# Patient Record
Sex: Male | Born: 1955
Health system: Southern US, Community
[De-identification: ages and names within clinical notes are randomized; demographics above are authoritative.]

## PROBLEM LIST (undated history)

## (undated) DIAGNOSIS — M549 Dorsalgia, unspecified: Secondary | ICD-10-CM

## (undated) HISTORY — PX: MULTIPLE TOOTH EXTRACTIONS: SHX2053

---

## 1998-08-13 ENCOUNTER — Encounter: Payer: Self-pay | Admitting: Emergency Medicine

## 1998-08-13 ENCOUNTER — Emergency Department (HOSPITAL_COMMUNITY): Admission: EM | Admit: 1998-08-13 | Discharge: 1998-08-13 | Payer: Self-pay | Admitting: Emergency Medicine

## 1999-12-26 ENCOUNTER — Emergency Department (HOSPITAL_COMMUNITY): Admission: EM | Admit: 1999-12-26 | Discharge: 1999-12-27 | Payer: Self-pay | Admitting: Emergency Medicine

## 2003-05-22 ENCOUNTER — Emergency Department (HOSPITAL_COMMUNITY): Admission: EM | Admit: 2003-05-22 | Discharge: 2003-05-22 | Payer: Self-pay | Admitting: Emergency Medicine

## 2003-06-03 ENCOUNTER — Ambulatory Visit (HOSPITAL_COMMUNITY): Admission: RE | Admit: 2003-06-03 | Discharge: 2003-06-03 | Payer: Self-pay | Admitting: Family Medicine

## 2006-05-07 ENCOUNTER — Emergency Department (HOSPITAL_COMMUNITY): Admission: EM | Admit: 2006-05-07 | Discharge: 2006-05-08 | Payer: Self-pay | Admitting: Emergency Medicine

## 2007-08-09 ENCOUNTER — Emergency Department (HOSPITAL_COMMUNITY): Admission: EM | Admit: 2007-08-09 | Discharge: 2007-08-10 | Payer: Self-pay | Admitting: Emergency Medicine

## 2007-08-14 ENCOUNTER — Inpatient Hospital Stay (HOSPITAL_COMMUNITY): Admission: EM | Admit: 2007-08-14 | Discharge: 2007-08-17 | Payer: Self-pay | Admitting: Emergency Medicine

## 2009-02-10 ENCOUNTER — Encounter: Admission: RE | Admit: 2009-02-10 | Discharge: 2009-02-10 | Payer: Self-pay | Admitting: Family Medicine

## 2010-01-27 ENCOUNTER — Encounter
Admission: RE | Admit: 2010-01-27 | Discharge: 2010-01-27 | Payer: Self-pay | Source: Home / Self Care | Attending: Family Medicine | Admitting: Family Medicine

## 2010-02-15 ENCOUNTER — Other Ambulatory Visit: Payer: Self-pay | Admitting: Family Medicine

## 2010-02-15 DIAGNOSIS — M545 Low back pain, unspecified: Secondary | ICD-10-CM

## 2010-02-15 DIAGNOSIS — R200 Anesthesia of skin: Secondary | ICD-10-CM

## 2010-02-20 ENCOUNTER — Ambulatory Visit
Admission: RE | Admit: 2010-02-20 | Discharge: 2010-02-20 | Disposition: A | Discharge status: Home / Self Care | Payer: BC Managed Care – PPO | Source: Ambulatory Visit | Attending: Family Medicine | Admitting: Family Medicine

## 2010-02-20 DIAGNOSIS — M545 Low back pain, unspecified: Secondary | ICD-10-CM

## 2010-02-20 DIAGNOSIS — R200 Anesthesia of skin: Secondary | ICD-10-CM

## 2010-05-23 NOTE — Discharge Summary (Signed)
NAMECRISTAL, HOWATT                 ACCOUNT NO.:  [id number]   MEDICAL RECORD NO.:  [id number]          PATIENT TYPE:  INP   LOCATION:  1432                         FACILITY:  [facility]   PHYSICIAN:  Beckey Rutter, MD  DATE OF BIRTH:  04-14-1955   DATE OF ADMISSION:  08/14/2007  DATE OF DISCHARGE:  08/17/2007                               DISCHARGE SUMMARY   PRIMARY CARE PHYSICIAN:  Unassigned.   CHIEF COMPLAINT:  Altered mental status.   HOSPITAL COURSE:  58. A 55 year old pleasant African American male admitted with altered      mental status felt secondary to alcohol withdrawal.  The patient      now is back to his baseline in terms of his mentation.  There is no      autonomic activity currently and the patient remained stable for      the last 2 days of admission.  2. The patient was n.p.o. on admission and during that time the      fingerstick was showing hypoglycemia.  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   Mr. Creary is very stable for discharge today.   DISCHARGE MEDICATIONS:  1. Folic acid.  2. Thiamine.  3. Ativan prescription given.   DISCHARGE DIAGNOSIS:  1. Altered mental status secondary to ethanol withdrawal.  2. Dehydration.   DISCHARGE/PLAN:  The patient is discharged today to follow up with his  primary medical doctor within 1 week.  The importance of followup is  stressed for him for further management of his chronic ethanol problems.  The patient has been seen by the social worker and he was given the  information to help him continue on his sobriety.      Beckey Rutter, MD  Electronically Signed     EME/MEDQ  D:  08/17/2007  T:  08/17/2007  Job:  [id number]

## 2010-05-23 NOTE — H&P (Signed)
Scott Lindsey, APFEL                 ACCOUNT NO.:  [id number]   MEDICAL RECORD NO.:  [id number]          PATIENT TYPE:  INP   LOCATION:  0101                         FACILITY:  [facility]   PHYSICIAN:  Eduard Clos, MDDATE OF BIRTH:  Jul 25, 1955   DATE OF ADMISSION:  08/14/2007  DATE OF DISCHARGE:                              HISTORY & PHYSICAL   HISTORY:  Obtained from ER notes and the patient's wife.   CHIEF COMPLAINT:  Altered mental status.   HISTORY OF PRESENT ILLNESS:  A 55 year old male with a history of  chronic alcoholism was brought into the ER after the patient's family  found that he was hallucinating and was getting very restless.  The  patient's family stated that since last Friday the patient has stopped  drinking alcohol suddenly with the intention of stopping it completely.  The patient has been drinking alcohol for the last 20-30 years.  The  patient's sister found him to be getting restless during the evening and  was hallucinating.  The patient did go to his job last night.  The  patient is admitted for further workup and management of alcohol  withdrawal.  The patient is presently drowsy from sedation and per  family, did not complain of any chest pain or shortness of breath.  Has  not had any vomiting but did have some nausea and denied any fever or  chills or diarrhea or discharges.   PAST MEDICAL HISTORY:  Chronic alcoholism.   PAST SURGICAL HISTORY:  None.   MEDICATIONS PRIOR TO ADMISSION:  None.   ALLERGIES:  No known drug allergies.   FAMILY HISTORY:  Noncontributory.   REVIEW OF SYSTEMS:  As per the history of present illness.  Nothing  possible as the patient is drowsy.   PHYSICAL EXAMINATION:  The patient was examined at the bedside.  The  patient is drowsy, arousable.  VITAL SIGNS:  Blood pressure 127/78, pulse 84 per minute, temperature  97, respirations 20 per minute.  HEENT:  Anicteric.  No pallor.  PERRLA.  CHEST:  Bilateral air entry  present.  No rhonchi and no crepitations.  HEART:  S1, S2 heard.  ABDOMEN:  Soft, nontender.  Bowel sounds heard.  CNS:  The patient is drowsy, arousable, moves all limbs.  EXTREMITIES:  Good pulses felt.   LABS:  CBC:  WBC IS 5.3, hemoglobin 12.5, hematocrit 35.8, MCV 101.8,  platelets 104.  Complete metabolic panel:  Sodium 132, potassium 3.9,  chloride 98, carbon dioxide 25, glucose 94, BUN 7, creatinine 0.86.  Alkaline phosphatase 78, AST 38, ALT 18.  Total protein 8.1.  Albumin  4.1.  Calcium 10.1.  Drug screen negative.  Alcohol level less than 5.   ASSESSMENT:  1. Altered mental status probably from alcohol withdrawal.  2. Hyponatremia from dehydration.  3. Chronic alcoholism.   PLAN:  Admit the patient to telemetry.  Place the patient on seizure  precautions and alcohol withdrawal protocol.  Ativan as necessary hourly  for any signs of withdrawal.  Place the patient on n.p.o. until the  patient is more alert and  awake.  Thiamine and folic acid.  Will get a  CT of the head and further recommendations as the patient's condition  evolves.      Eduard Clos, MD  Electronically Signed     ANK/MEDQ  D:  08/14/2007  T:  08/14/2007  Job:  [id number]

## 2010-10-06 LAB — COMPREHENSIVE METABOLIC PANEL
ALT: 19
ALT: 19
AST: 37
AST: 57 — ABNORMAL HIGH
Albumin: 4.1
Albumin: 4.1
Alkaline Phosphatase: 69
Alkaline Phosphatase: 78
Alkaline Phosphatase: 85
BUN: 6
CO2: 24
CO2: 25
CO2: 27
Calcium: 10.1
Calcium: 8.9
Chloride: 100
Chloride: 105
Chloride: 98
Creatinine, Ser: 0.85
Creatinine, Ser: 0.86
GFR calc Af Amer: >60
GFR calc Af Amer: >60
GFR calc non Af Amer: >60
GFR calc non Af Amer: >60
GFR calc non Af Amer: >60
Glucose, Bld: 75
Glucose, Bld: 93
Potassium: 3.8
Potassium: 4.7
Sodium: 132 — ABNORMAL LOW
Sodium: 137
Sodium: 139
Total Bilirubin: 0.6
Total Bilirubin: 1
Total Protein: 7.8

## 2010-10-06 LAB — GLUCOSE, CAPILLARY
Glucose-Capillary: 101 — ABNORMAL HIGH
Glucose-Capillary: 109 — ABNORMAL HIGH
Glucose-Capillary: 44 — ABNORMAL LOW
Glucose-Capillary: 46 — ABNORMAL LOW
Glucose-Capillary: 49 — ABNORMAL LOW
Glucose-Capillary: 55 — ABNORMAL LOW
Glucose-Capillary: 57 — ABNORMAL LOW
Glucose-Capillary: 63 — ABNORMAL LOW
Glucose-Capillary: 82
Glucose-Capillary: 84
Glucose-Capillary: 88
Glucose-Capillary: 90

## 2010-10-06 LAB — LIPID PANEL
Total CHOL/HDL Ratio: 2.1
VLDL: 9

## 2010-10-06 LAB — CBC
HCT: 37.2 — ABNORMAL LOW
Hemoglobin: 11.5 — ABNORMAL LOW
Hemoglobin: 12.8 — ABNORMAL LOW
Hemoglobin: 13.4
MCHC: 34.1
MCHC: 34.4
MCHC: 34.5
MCV: 101.8 — ABNORMAL HIGH
MCV: 102.1 — ABNORMAL HIGH
Platelets: 104 — ABNORMAL LOW
Platelets: 109 — ABNORMAL LOW
Platelets: 115 — ABNORMAL LOW
RBC: 3.52 — ABNORMAL LOW
RBC: 3.64 — ABNORMAL LOW
RBC: 3.8 — ABNORMAL LOW
RDW: 14.9
RDW: 14.9
WBC: 3.6 — ABNORMAL LOW
WBC: 4.3
WBC: 5.3

## 2010-10-06 LAB — DIFFERENTIAL
Basophils Absolute: 0
Basophils Absolute: 0
Basophils Relative: 1
Basophils Relative: 1
Eosinophils Absolute: 0.1
Eosinophils Relative: 2
Lymphocytes Relative: 38
Lymphs Abs: 1.6
Monocytes Absolute: 0.7
Monocytes Relative: 17 — ABNORMAL HIGH
Monocytes Relative: 21 — ABNORMAL HIGH
Neutro Abs: 1.8
Neutro Abs: 3.4
Neutrophils Relative %: 43
Neutrophils Relative %: 65

## 2010-10-06 LAB — URINALYSIS, ROUTINE W REFLEX MICROSCOPIC
Bilirubin Urine: NEGATIVE
Glucose, UA: NEGATIVE
Hgb urine dipstick: NEGATIVE
Ketones, ur: NEGATIVE
Nitrite: NEGATIVE
Protein, ur: NEGATIVE
Specific Gravity, Urine: 1.009
Urobilinogen, UA: 1
pH: 5.5

## 2010-10-06 LAB — RAPID URINE DRUG SCREEN, HOSP PERFORMED
Amphetamines: NOT DETECTED
Amphetamines: NOT DETECTED
Barbiturates: NOT DETECTED
Barbiturates: NOT DETECTED
Benzodiazepines: NOT DETECTED
Benzodiazepines: NOT DETECTED
Cocaine: NOT DETECTED
Opiates: NOT DETECTED
Tetrahydrocannabinol: NOT DETECTED
Tetrahydrocannabinol: NOT DETECTED

## 2010-10-06 LAB — BASIC METABOLIC PANEL
CO2: 26
Calcium: 8.9
Creatinine, Ser: 0.82
GFR calc non Af Amer: >60
Glucose, Bld: 101 — ABNORMAL HIGH
Sodium: 138

## 2010-10-06 LAB — ETHANOL: Alcohol, Ethyl (B): 432

## 2010-10-06 LAB — MAGNESIUM: Magnesium: 1.8

## 2010-11-07 ENCOUNTER — Ambulatory Visit
Admission: RE | Admit: 2010-11-07 | Discharge: 2010-11-07 | Disposition: A | Discharge status: Home / Self Care | Payer: BC Managed Care – PPO | Source: Ambulatory Visit | Attending: Radiation Oncology | Admitting: Radiation Oncology

## 2010-11-07 DIAGNOSIS — F17200 Nicotine dependence, unspecified, uncomplicated: Secondary | ICD-10-CM | POA: Insufficient documentation

## 2010-11-07 DIAGNOSIS — M5126 Other intervertebral disc displacement, lumbar region: Secondary | ICD-10-CM | POA: Insufficient documentation

## 2010-11-07 DIAGNOSIS — C61 Malignant neoplasm of prostate: Secondary | ICD-10-CM | POA: Insufficient documentation

## 2010-11-14 ENCOUNTER — Ambulatory Visit: Payer: BC Managed Care – PPO

## 2010-11-14 ENCOUNTER — Ambulatory Visit: Payer: BC Managed Care – PPO | Admitting: Radiation Oncology

## 2010-11-16 ENCOUNTER — Other Ambulatory Visit: Payer: Self-pay | Admitting: Urology

## 2010-12-01 ENCOUNTER — Encounter (HOSPITAL_COMMUNITY): Payer: Self-pay

## 2010-12-11 ENCOUNTER — Encounter (HOSPITAL_COMMUNITY): Payer: Self-pay

## 2010-12-11 ENCOUNTER — Encounter (HOSPITAL_COMMUNITY)
Admission: RE | Admit: 2010-12-11 | Discharge: 2010-12-11 | Disposition: A | Discharge status: Home / Self Care | Payer: BC Managed Care – PPO | Source: Ambulatory Visit | Attending: Urology | Admitting: Urology

## 2010-12-11 LAB — BASIC METABOLIC PANEL
BUN: 5 mg/dL — ABNORMAL LOW (ref 6–23)
Calcium: 9.1 mg/dL (ref 8.4–10.5)
Creatinine, Ser: 0.72 mg/dL (ref 0.50–1.35)
GFR calc Af Amer: >90 mL/min (ref >90)
GFR calc non Af Amer: >90 mL/min (ref >90)
Glucose, Bld: 90 mg/dL (ref 70–99)
Potassium: 4.1 mEq/L (ref 3.5–5.1)

## 2010-12-11 LAB — CBC
HCT: 30.4 % — ABNORMAL LOW (ref 39.0–52.0)
Hemoglobin: 10.5 g/dL — ABNORMAL LOW (ref 13.0–17.0)
MCH: 31.6 pg (ref 26.0–34.0)
MCHC: 34.5 g/dL (ref 30.0–36.0)
MCV: 91.6 fL (ref 78.0–100.0)
RDW: 17.3 % — ABNORMAL HIGH (ref 11.5–15.5)

## 2010-12-11 LAB — TYPE AND SCREEN

## 2010-12-11 NOTE — Patient Instructions (Signed)
18 Scott Lindsey  12/11/2010   Your procedure is scheduled on:  WED  12/5  AT 8:30 AM  Report to [facility] at  6:30 AM.  Call this number if you have problems the morning of surgery: [phone]   Remember: FOLLOW BOWEL PREP INSTRUCTIONS AND CLEAR LIQUID DIET INSTRUCTIONS -THE DAY BEFORE YOUR SURGERY.   Do not eat food OR DRINK ANYTHING :After Midnight THE NIGHT BEFORE YOUR SURGERY.       Do not wear jewelry, make-up or nail polish.  Do not wear lotions, powders, or perfumes. You may wear deodorant.  Do not shave 48 hours prior to surgery.  Do not bring valuables to the hospital.  Contacts, dentures or bridgework may not be worn into surgery.  Leave suitcase in the car. After surgery it may be brought to your room.  For patients admitted to the hospital, checkout time is 11:00 AM the day of discharge.   Patients discharged the day of surgery will not be allowed to drive home.    Special Instructions: CHG Shower Use Special Wash: 1/2 bottle night before surgery and 1/2 bottle morning of surgery.   Please read over the following fact sheets that you were given: Blood Transfusion Information and MRSA Information

## 2010-12-12 DIAGNOSIS — C61 Malignant neoplasm of prostate: Secondary | ICD-10-CM | POA: Diagnosis present

## 2010-12-12 NOTE — H&P (Signed)
History of Present Illness     Scott Lindsey  was sent to me with an elevated PSA of approximately 7.1.  Digital rectal exam revealed a small prostate without any nodules or induration.  Ultrasound revealed a 20 gram prostate without any other worrisome features.  Unfortunately, the patient's biopsies were positive.  On the right side 5 out of 6 cores were positive primarily for Gleason 3+3=6 cancer, although one core did show Gleason 3+4=7 cancer.  Core involvement was anywhere from 10-60% with more of the tumor concentrated at the mid and base of the prostate.  On the left side 4 out of 6 biopsies were also positive.  This was a mixture of Gleason 3+3=6 and Gleason 3+4=7 cancer.  Core involvement in the 5-20% range.  The patient appears to have intermediate risk clinical stage T1c disease. He has had no obvious complications or problems from the biopsy.  The patient denied any significant voiding complaints prior to his biopsy.  Bone scan was negative.    Past Medical History Problems  1. History of  Bulging Lumbar Disc 722.10  Current Meds 1. Advil 200 MG Oral Capsule; 1 capsule as needed; Therapy: (Recorded:19Jul2012) to  Allergies Medication  1. No Known Drug Allergies  Family History Problems  1. Maternal history of  Acute Myocardial Infarction V17.3 2. Paternal history of  Acute Myocardial Infarction V17.3 3. Maternal history of  Diabetes Mellitus V18.0 4. Family history of  Family Health Status Number Of Children 2; 1 son / 1 daughter 5. Family history of  Father Deceased At Age ____ 69 / Heart Attack 6. Maternal history of  Hypertension V17.49 7. Family history of  Mother Deceased At Age ____ 34 / Heart Attack  Social History Problems    Alcohol Use 2 per day   Marital History - Currently Married   Occupation: Health and safety inspector   Tobacco Use 305.1 Smokes 1/2 pack or less daily; Smoked since the age of 8, approx. 38 years; Denies any other forms of tobacco use. Denied     History of  Caffeine Use  Review of Systems Genitourinary, constitutional, skin, eye, otolaryngeal, hematologic/lymphatic, cardiovascular, pulmonary, endocrine, musculoskeletal, gastrointestinal, neurological and psychiatric system(s) were reviewed and pertinent findings if present are noted.  Musculoskeletal: back pain and joint pain.    Vitals Vital Signs [Data Includes: Last 1 Day]  19Jul2012 02:00PM  BMI Calculated: 21.98 BSA Calculated: 1.78 Height: 5 ft 8 in Weight: 145 lb  Blood Pressure: 111 / 75, RUE, Sitting Temperature: 98.6 F, Oral Heart Rate: 98  Physical Exam Constitutional: Well nourished and well developed . No acute distress.  ENT:. The ears and nose are normal in appearance.  Neck: The appearance of the neck is normal and no neck mass is present.  Pulmonary: No respiratory distress and normal respiratory rhythm and effort.  Cardiovascular: Heart rate and rhythm are normal . No peripheral edema.  Abdomen: The abdomen is soft and nontender. No masses are palpated. No CVA tenderness. No hernias are palpable. No hepatosplenomegaly noted.  Rectal: Rectal exam demonstrates decreased sphincter tone, no tenderness and no masses. Estimated prostate size is 1+. The prostate has no nodularity and is not tender. The left seminal vesicle is nonpalpable. The right seminal vesicle is nonpalpable. The perineum is normal on inspection.  Genitourinary: Examination of the penis demonstrates no discharge, no masses, no lesions and a normal meatus. The penis is circumcised. The scrotum is without lesions. The right epididymis is palpably normal and non-tender. The left epididymis  is palpably normal and non-tender. The right testis is non-tender and without masses. The left testis is non-tender and without masses.  Lymphatics: The femoral and inguinal nodes are not enlarged or tender.  Skin: Normal skin turgor, no visible rash and no visible skin lesions.  Neuro/Psych:. Mood and affect are  appropriate.    Results/Data Urine [Data Includes: Last 1 Day]  19Jul2012  COLOR: YELLOW  Reference Range YELLOW APPEARANCE: CLEAR  Reference Range CLEAR SPECIFIC GRAVITY: 1.025  Reference Range 1.005-1.030 pH: 5.5  Reference Range 5.0-8.0 GLUCOSE: NEG mg/dL Reference Range NEG BILIRUBIN: SMALL  Abnormal Reference Range NEG KETONE: TRACE mg/dL Abnormal Reference Range NEG BLOOD: NEG  Reference Range NEG PROTEIN: NEG mg/dL Reference Range NEG UROBILINOGEN: 0.2 mg/dL Reference Range 9.8-1.1 NITRITE: NEG  Reference Range NEG LEUKOCYTE ESTERASE: NEG  Reference Range NEG  Assessment Assessed  1. Prostate Cancer 185  Discussion/Summary  The patient was counseled about the natural history of prostate cancer and the standard treatment options that are available for prostate cancer. It was explained to him how his age and life expectancy, clinical stage, Gleason score, and PSA affect his prognosis, the decision to proceed with additional staging studies, as well as how that information influences recommended treatment strategies. We discussed the roles for active surveillance, radiation therapy, surgical therapy, androgen deprivation, as well as ablative therapy options for the treatment of prostate cancer as appropriate to his individual cancer situation. We discussed the risks and benefits of these options with regard to their impact on cancer control and also in terms of potential adverse events, complications, and impact on quiality of life particularly related to urinary, bowel, and sexual function. The patient was encouraged to ask questions throughout the discussion today and all questions were answered to his stated satisfaction. In addition, the patient was provided with and/or directed to appropriate resources and literature for further education about prostate cancer and treatment options.   We discussed surgical therapy for prostate cancer including the different available surgical  approaches. We discussed, in detail, the risks and expectations of surgery with regard to cancer control, urinary control, and erectile function as well as the expected postoperative recovery process. The risks, potential complications/adverse events of radical prostatectomy as well as alternative options were explained to the patient.   We discussed surgical therapy for prostate cancer including the different available surgical approaches. We discussed, in detail, the risks and expectations of surgery with regard to cancer control, urinary control, and erectile function as well as the expected postoperative recovery process. Additional risks of surgery including but not limited to bleeding, infection, hernia formation, nerve damage, lymphocele formation, bowel/rectal injury potentially necessitating colostomy, damage to the urinary tract resulting in urine leakage, urethral stricture, and the cardiopulmonary risks such as myocardial infarction, stroke, death, venothromboembolism, etc. were explained. The risk of open surgical conversion for robotic/laparoscopic prostatectomy was also discussed.

## 2010-12-13 ENCOUNTER — Encounter (HOSPITAL_COMMUNITY): Payer: Self-pay | Admitting: Certified Registered Nurse Anesthetist

## 2010-12-13 ENCOUNTER — Encounter (HOSPITAL_COMMUNITY): Payer: Self-pay | Admitting: *Deleted

## 2010-12-13 ENCOUNTER — Encounter (HOSPITAL_COMMUNITY)
Admission: RE | Disposition: A | Discharge status: Home / Self Care | Payer: Self-pay | Source: Ambulatory Visit | Attending: Urology

## 2010-12-13 ENCOUNTER — Other Ambulatory Visit: Payer: Self-pay | Admitting: Urology

## 2010-12-13 ENCOUNTER — Inpatient Hospital Stay (HOSPITAL_COMMUNITY)
Admission: RE | Admit: 2010-12-13 | Discharge: 2010-12-14 | DRG: 335 | Disposition: A | Discharge status: Home / Self Care | Payer: BC Managed Care – PPO | Source: Ambulatory Visit | Attending: Urology | Admitting: Urology

## 2010-12-13 ENCOUNTER — Inpatient Hospital Stay (HOSPITAL_COMMUNITY): Payer: BC Managed Care – PPO | Admitting: Certified Registered Nurse Anesthetist

## 2010-12-13 DIAGNOSIS — C61 Malignant neoplasm of prostate: Principal | ICD-10-CM | POA: Diagnosis present

## 2010-12-13 DIAGNOSIS — F17200 Nicotine dependence, unspecified, uncomplicated: Secondary | ICD-10-CM | POA: Diagnosis present

## 2010-12-13 DIAGNOSIS — D6489 Other specified anemias: Secondary | ICD-10-CM | POA: Diagnosis present

## 2010-12-13 HISTORY — PX: ROBOT ASSISTED LAPAROSCOPIC RADICAL PROSTATECTOMY: SHX5141

## 2010-12-13 SURGERY — ROBOTIC ASSISTED LAPAROSCOPIC RADICAL PROSTATECTOMY
Anesthesia: General | Site: Abdomen | Wound class: Clean Contaminated

## 2010-12-13 MED ORDER — KCL IN DEXTROSE-NACL 10-5-0.45 MEQ/L-%-% IV SOLN
INTRAVENOUS | Status: DC
  Administered 2010-12-13: 125 mL/h via INTRAVENOUS
  Administered 2010-12-14: 03:00:00 via INTRAVENOUS
  Filled 2010-12-13 (×6): qty 1000

## 2010-12-13 MED ORDER — BUPIVACAINE-EPINEPHRINE 0.25% -1:200000 IJ SOLN
INTRAMUSCULAR | Status: DC | PRN
  Administered 2010-12-13: 26 mL

## 2010-12-13 MED ORDER — ACETAMINOPHEN 10 MG/ML IV SOLN
INTRAVENOUS | Status: DC | PRN
  Administered 2010-12-13: 1000 mg via INTRAVENOUS

## 2010-12-13 MED ORDER — CISATRACURIUM BESYLATE 2 MG/ML IV SOLN
INTRAVENOUS | Status: DC | PRN
  Administered 2010-12-13: 4 mg via INTRAVENOUS
  Administered 2010-12-13: 2 mg via INTRAVENOUS
  Administered 2010-12-13: 4 mg via INTRAVENOUS

## 2010-12-13 MED ORDER — FENTANYL CITRATE 0.05 MG/ML IJ SOLN
INTRAMUSCULAR | Status: DC | PRN
  Administered 2010-12-13 (×2): 100 ug via INTRAVENOUS
  Administered 2010-12-13: 50 ug via INTRAVENOUS

## 2010-12-13 MED ORDER — PROMETHAZINE HCL 25 MG/ML IJ SOLN: 6.2500 mg | INTRAMUSCULAR | Status: DC | PRN

## 2010-12-13 MED ORDER — KETOROLAC TROMETHAMINE 30 MG/ML IJ SOLN
30.0000 mg | Freq: Four times a day (QID) | INTRAMUSCULAR | Status: DC
  Administered 2010-12-13 – 2010-12-14 (×5): 30 mg via INTRAVENOUS
  Filled 2010-12-13 (×5): qty 1

## 2010-12-13 MED ORDER — MIDAZOLAM HCL 5 MG/5ML IJ SOLN
INTRAMUSCULAR | Status: DC | PRN
  Administered 2010-12-13: 2 mg via INTRAVENOUS

## 2010-12-13 MED ORDER — MEPERIDINE HCL 50 MG/ML IJ SOLN: 6.2500 mg | INTRAMUSCULAR | Status: DC | PRN

## 2010-12-13 MED ORDER — LACTATED RINGERS IV SOLN
INTRAVENOUS | Status: DC
  Administered 2010-12-13: 100 mL/h via INTRAVENOUS

## 2010-12-13 MED ORDER — LACTATED RINGERS IV SOLN
INTRAVENOUS | Status: DC | PRN
  Administered 2010-12-13: 08:00:00 via INTRAVENOUS

## 2010-12-13 MED ORDER — SUCCINYLCHOLINE CHLORIDE 20 MG/ML IJ SOLN
INTRAMUSCULAR | Status: DC | PRN
  Administered 2010-12-13: 100 mg via INTRAVENOUS

## 2010-12-13 MED ORDER — PROPOFOL 10 MG/ML IV BOLUS
INTRAVENOUS | Status: DC | PRN
  Administered 2010-12-13: 50 mg via INTRAVENOUS
  Administered 2010-12-13: 150 mg via INTRAVENOUS

## 2010-12-13 MED ORDER — CEFAZOLIN SODIUM 1-5 GM-% IV SOLN
1.0000 g | Freq: Three times a day (TID) | INTRAVENOUS | Status: AC
  Administered 2010-12-13 (×2): 1 g via INTRAVENOUS
  Filled 2010-12-13 (×2): qty 50

## 2010-12-13 MED ORDER — CIPROFLOXACIN HCL 500 MG PO TABS: 500.0000 mg | ORAL_TABLET | Freq: Two times a day (BID) | ORAL | Status: AC

## 2010-12-13 MED ORDER — SODIUM CHLORIDE 0.9 % IR SOLN
Status: DC | PRN
  Administered 2010-12-13: 1000 mL

## 2010-12-13 MED ORDER — HYDROCODONE-ACETAMINOPHEN 5-325 MG PO TABS
1.0000 | ORAL_TABLET | Freq: Four times a day (QID) | ORAL | Status: AC | PRN
Start: 2010-12-13 — End: 2010-12-23

## 2010-12-13 MED ORDER — INDIGOTINDISULFONATE SODIUM 8 MG/ML IJ SOLN
INTRAMUSCULAR | Status: DC | PRN
  Administered 2010-12-13 (×2): 5 mL via INTRAVENOUS

## 2010-12-13 MED ORDER — HYDROMORPHONE HCL PF 1 MG/ML IJ SOLN: 0.2500 mg | INTRAMUSCULAR | Status: DC | PRN

## 2010-12-13 MED ORDER — SODIUM CHLORIDE 0.9 % IV SOLN
1.5000 g | Freq: Once | INTRAVENOUS | Status: AC
  Administered 2010-12-13: 1.5 g via INTRAVENOUS

## 2010-12-13 MED ORDER — SODIUM CHLORIDE 0.9 % IV BOLUS (SEPSIS)
1000.0000 mL | Freq: Once | INTRAVENOUS | Status: AC
  Administered 2010-12-13: 1000 mL via INTRAVENOUS

## 2010-12-13 MED ORDER — HYDROCODONE-ACETAMINOPHEN 5-325 MG PO TABS: 1.0000 | ORAL_TABLET | ORAL | Status: DC | PRN

## 2010-12-13 MED ORDER — HYDROMORPHONE HCL PF 1 MG/ML IJ SOLN
INTRAMUSCULAR | Status: DC | PRN
  Administered 2010-12-13 (×2): 1 mg via INTRAVENOUS

## 2010-12-13 MED ORDER — LACTATED RINGERS IV SOLN
INTRAVENOUS | Status: DC | PRN
  Administered 2010-12-13: 10:00:00

## 2010-12-13 MED ORDER — LIDOCAINE HCL (CARDIAC) 20 MG/ML IV SOLN
INTRAVENOUS | Status: DC | PRN
  Administered 2010-12-13: 80 mg via INTRAVENOUS

## 2010-12-13 MED ORDER — MORPHINE SULFATE 2 MG/ML IJ SOLN: 2.0000 mg | INTRAMUSCULAR | Status: DC | PRN

## 2010-12-13 MED ORDER — ONDANSETRON HCL 4 MG/2ML IJ SOLN
INTRAMUSCULAR | Status: DC | PRN
  Administered 2010-12-13: 4 mg via INTRAVENOUS

## 2010-12-13 SURGICAL SUPPLY — 42 items
CANISTER SUCTION 2500CC (MISCELLANEOUS) ×2 IMPLANT
CATH FOLEY 2WAY SLVR  5CC 20FR (CATHETERS) ×1
CATH FOLEY 2WAY SLVR 5CC 20FR (CATHETERS) ×1 IMPLANT
CATH ROBINSON RED A/P 8FR (CATHETERS) ×2 IMPLANT
CATH TIEMANN FOLEY 18FR 5CC (CATHETERS) ×2 IMPLANT
CHLORAPREP W/TINT 26ML (MISCELLANEOUS) ×2 IMPLANT
CLIP LIGATING HEM O LOK PURPLE (MISCELLANEOUS) ×8 IMPLANT
CLOTH BEACON ORANGE TIMEOUT ST (SAFETY) ×2 IMPLANT
CORD HIGH FREQUENCY UNIPOLAR (ELECTROSURGICAL) ×2 IMPLANT
COVER SURGICAL LIGHT HANDLE (MISCELLANEOUS) ×2 IMPLANT
COVER TIP SHEARS 8 DVNC (MISCELLANEOUS) ×1 IMPLANT
COVER TIP SHEARS 8MM DA VINCI (MISCELLANEOUS) ×1
DECANTER SPIKE VIAL GLASS SM (MISCELLANEOUS) ×2 IMPLANT
DRAPE SURG IRRIG POUCH 19X23 (DRAPES) ×2 IMPLANT
DRAPE UTILITY 15X26 (DRAPE) ×2 IMPLANT
DRSG TEGADERM 6X8 (GAUZE/BANDAGES/DRESSINGS) ×6 IMPLANT
ELECT REM PT RETURN 9FT ADLT (ELECTROSURGICAL) ×2
ELECTRODE REM PT RTRN 9FT ADLT (ELECTROSURGICAL) ×1 IMPLANT
GLOVE BIO SURGEON STRL SZ 6.5 (GLOVE) ×4 IMPLANT
GLOVE BIOGEL M STRL SZ7.5 (GLOVE) ×2 IMPLANT
GOWN PREVENTION PLUS XLARGE (GOWN DISPOSABLE) ×2 IMPLANT
GOWN STRL NON-REIN LRG LVL3 (GOWN DISPOSABLE) ×2 IMPLANT
GOWN STRL REIN XL XLG (GOWN DISPOSABLE) ×2 IMPLANT
HOLDER FOLEY CATH W/STRAP (MISCELLANEOUS) ×2 IMPLANT
IV LACTATED RINGERS 1000ML (IV SOLUTION) ×2 IMPLANT
KIT ACCESSORY DA VINCI DISP (KITS) ×1
KIT ACCESSORY DVNC DISP (KITS) ×1 IMPLANT
NDL SAFETY ECLIPSE 18X1.5 (NEEDLE) ×1 IMPLANT
NEEDLE HYPO 18GX1.5 SHARP (NEEDLE) ×1
PACK ROBOT UROLOGY CUSTOM (CUSTOM PROCEDURE TRAY) ×2 IMPLANT
POSITIONER SURGICAL ARM (MISCELLANEOUS) ×4 IMPLANT
RELOAD GREEN ECHELON 45 (STAPLE) ×2 IMPLANT
SEALER TISSUE G2 CVD JAW 45CM (ENDOMECHANICALS) IMPLANT
SET TUBE IRRIG SUCTION NO TIP (IRRIGATION / IRRIGATOR) ×2 IMPLANT
SOLUTION ELECTROLUBE (MISCELLANEOUS) ×2 IMPLANT
SPONGE GAUZE 4X4 12PLY (GAUZE/BANDAGES/DRESSINGS) ×2 IMPLANT
SUT VIC AB 2-0 SH 27 (SUTURE) ×1
SUT VIC AB 2-0 SH 27X BRD (SUTURE) ×1 IMPLANT
SUT VICRYL 0 UR6 27IN ABS (SUTURE) ×2 IMPLANT
SYR 27GX1/2 1ML LL SAFETY (SYRINGE) ×2 IMPLANT
TOWEL OR NON WOVEN STRL DISP B (DISPOSABLE) ×2 IMPLANT
WATER STERILE IRR 1500ML POUR (IV SOLUTION) ×4 IMPLANT

## 2010-12-13 NOTE — Anesthesia Postprocedure Evaluation (Signed)
  Anesthesia Post-op Note  Patient: Scott Lindsey  Procedure(s) Performed:  ROBOTIC ASSISTED LAPAROSCOPIC RADICAL PROSTATECTOMY - with Bilateral Pelivic Lymph Node Dissection  Patient Location: PACU  Anesthesia Type: General  Level of Consciousness: awake and alert   Airway and Oxygen Therapy: Patient Spontanous Breathing  Post-op Pain: mild  Post-op Assessment: Post-op Vital signs reviewed, Patient's Cardiovascular Status Stable, Respiratory Function Stable, Patent Airway and No signs of Nausea or vomiting  Post-op Vital Signs: stable  Complications: No apparent anesthesia complications

## 2010-12-13 NOTE — Anesthesia Preprocedure Evaluation (Addendum)
Anesthesia Evaluation  Patient identified by MRN, date of birth, ID band Patient awake    Reviewed: Allergy & Precautions, H&P , NPO status , Patient's Chart, lab work & pertinent test results  Airway Mallampati: II TM Distance: >3 FB Neck ROM: Full    Dental No notable dental hx.    Pulmonary neg pulmonary ROS,  clear to auscultation  Pulmonary exam normal       Cardiovascular neg cardio ROS Regular Normal thrombocytopenia   Neuro/Psych Negative Neurological ROS  Negative Psych ROS   GI/Hepatic negative GI ROS, Neg liver ROS,   Endo/Other  Negative Endocrine ROS  Renal/GU negative Renal ROS  Genitourinary negative   Musculoskeletal negative musculoskeletal ROS (+)   Abdominal   Peds negative pediatric ROS (+)  Hematology negative hematology ROS (+)   Anesthesia Other Findings   Reproductive/Obstetrics negative OB ROS                          Anesthesia Physical Anesthesia Plan  ASA: II  Anesthesia Plan: General   Post-op Pain Management:    Induction: Intravenous  Airway Management Planned: Oral ETT  Additional Equipment:   Intra-op Plan:   Post-operative Plan: Extubation in OR  Informed Consent: I have reviewed the patients History and Physical, chart, labs and discussed the procedure including the risks, benefits and alternatives for the proposed anesthesia with the patient or authorized representative who has indicated his/her understanding and acceptance.   Dental advisory given  Plan Discussed with: CRNA  Anesthesia Plan Comments:         Anesthesia Quick Evaluation

## 2010-12-13 NOTE — Interval H&P Note (Signed)
History and Physical Interval Note:  12/13/2010 8:25 AM  Scott Lindsey  has presented today for surgery, with the diagnosis of prostate cancer  The various methods of treatment have been discussed with the patient and family. After consideration of risks, benefits and other options for treatment, the patient has consented to  Procedure(s): ROBOTIC ASSISTED LAPAROSCOPIC RADICAL PROSTATECTOMY as a surgical intervention .  The patients' history has been reviewed, patient examined, no change in status, stable for surgery.  I have reviewed the patients' chart and labs.  Questions were answered to the patient's satisfaction.     Scott Lindsey S

## 2010-12-13 NOTE — Transfer of Care (Signed)
Immediate Anesthesia Transfer of Care Note  Patient: Scott Lindsey  Procedure(s) Performed:  ROBOTIC ASSISTED LAPAROSCOPIC RADICAL PROSTATECTOMY - with Bilateral Pelivic Lymph Node Dissection  Patient Location: PACU  Anesthesia Type: General  Level of Consciousness: awake, alert  and oriented  Airway & Oxygen Therapy: Patient Spontanous Breathing and Patient connected to face mask oxygen  Post-op Assessment: Report given to PACU RN  Post vital signs: Reviewed and stable  Complications: No apparent anesthesia complications

## 2010-12-13 NOTE — Progress Notes (Signed)
Pt did mechanical bowel prep 12/12/10 with good results

## 2010-12-13 NOTE — Op Note (Signed)
Preoperative diagnosis: Clinical stage T1c Adenocarcinoma prostate  Postoperative diagnosis: Same  Procedure: Robotic-assisted laparoscopic radical retropubic prostatectomy with bilateral pelvic lymph node dissection  Surgeon: Valetta Fuller, MD  Asst.: Pecola Leisure, PA  Anesthesia: Gen. Endotracheal  Indications: Patient was diagnosed with clinical stage TIc Adenocarcinoma the prostate. He underwent extensive consultation with regard to treatment options. The patient decided on a surgical approach. He appeared to understand the distinct advantages as well as the disadvantages of this procedure. The patient has performed a mechanical bowel prep. He has had placement of PAS compression boots and received perioperative antibiotics. The patient's preoperative PSA was 7.1 Ultrasound revealed a 20 g prostate. 9/12 cores were positive for adenocarcinoma with a mixture of Gleason 3+3+6 and Gleason 3+4=7. Technique and findings:The patient was brought to the operating room and had successful induction of general endotracheal anesthesia.the patient was placed in a low lithotomy position with careful padding of all extremities. He was secured to the operative table and placed in the steep Trendelenburg position. He was prepped and draped in usual manner. A Foley catheter was placed sterilely on the field. Camera port site was chosen 18 cm above the pubic symphysis just to the left of the umbilicus. A standard open Hassan technique was utilized. A 12 mm trocar was placed without difficulty. The camera was then inserted and no abnormalities were noted within the pelvis. The trochars were placed with direct visual guidance. This included 3 8mm robotic trochars and a 12 mm and 5 mm assist ports. Once all the ports were placed the robot was docked. The bladder was filled and the space of Retzius was developed with electrocautery dissection as well as blunt dissection. Superficial fat off the endopelvic fascia and  bladder neck was removed with electrocautery scissors. The endopelvic fascia was then incised bilaterally from base to apex. Levator musculature was swept off the apex of the prostate isolating the dorsal venous complex which was then stapled with the ETS stapling device. The anterior bladder neck was identified with the aid of the Foley balloon. This was then transected down to the Foley catheter with electrocautery scissors. The Foley catheter was then retracted anteriorly. Indigo carmine was given and we appeared to be well away from the ureteral orifices. The posterior bladder neck was then transected and the dissection carried down to the adnexal structures. The seminal vesicles and vas deferens on both sides were then individually dissected free and retracted anteriorly. The posterior plane between the rectum and prostate was then established primarily with blunt dissection.  Attention was then turned towards nerve sparing. The patient was felt to be a candidate for limited bilateral nerve sparing. Superficial fascia along the anterior lateral aspect of the prostate was incised bilaterally. This tissue was then swept laterally until we were able to establish a groove between the neurovascular tissue and the posterior lateral aspect on the prostate bilaterally. This groove was then extended from the apex back to the base of the prostate. With the prostate retracted anteriorly the vascular pedicles of the prostate were taken with the Enseal device. The Foley catheter was then reinserted and the anterior urethra was transected. The posterior urethra was then transected as were some rectourethralis fibers. The prostate was then removed from the pelvis. The pelvis was then copiously irrigated. Rectal insufflation was performed and there was no evidence of rectal injury.  Attention was then turned towards bilateral pelvic lymph node dissection. The obturator node packets were removed I laterally and the dissection  extended towards the bifurcation of the iliac artery. The obturator nerve was identified on both sides and preserved. Hemalock clips were used for small veins and lymphatic channels. The node packets were sent for permanent analysis.  Attention was then turned towards reconstruction. The bladder neck did not require any reconstruction. The bladder neck and posterior urethra were reapproximated at the 6:00 position utilizing a 2-0 Vicryl suture. The rest of the anastomosis was done with a double-armed 3-0 Monocryl suture in a 360 degree manner. Additional indigo carmine was given. A new catheter was placed and bladder irrigation revealed no evidence of leakage. A Blake drain was placed through one of the robotic trochars and positioned in the retropubic space above the anastomosis. This was then secured to the skin with a nylon suture. The prostate was placed in the Endopouch retrieval bag. The 12 mm trocar site was closed with a Vicryl suture with the aid of a suture passer. Our other trochars were taken out with direct visual guidance without evidence of any bleeding. The camera port incision was extended slightly to allow for removal of the specimen and then closed with a running Vicryl suture. All port sites were infiltrated with Marcaine and then closed with surgical clips. The patient was then taken to recovery room having had no obvious complications or problems. Sponge and needle counts were correct.

## 2010-12-13 NOTE — Anesthesia Procedure Notes (Signed)
Procedure Name: Intubation Date/Time: 12/13/2010 8:32 AM Performed by: Hulan Fess Pre-anesthesia Checklist: Patient identified, Emergency Drugs available, Suction available, Patient being monitored and Timeout performed Patient Re-evaluated:Patient Re-evaluated prior to inductionOxygen Delivery Method: Circle System Utilized Preoxygenation: Pre-oxygenation with 100% oxygen Intubation Type: IV induction Ventilation: Mask ventilation without difficulty Laryngoscope Size: Mac and 3 Grade View: Grade I Tube type: Oral Tube size: 8.0 mm Number of attempts: 1 Placement Confirmation: ETT inserted through vocal cords under direct vision,  positive ETCO2 and breath sounds checked- equal and bilateral

## 2010-12-14 LAB — MRSA CULTURE

## 2010-12-14 LAB — BASIC METABOLIC PANEL
BUN: 3 mg/dL — ABNORMAL LOW (ref 6–23)
CO2: 25 mEq/L (ref 19–32)
Chloride: 99 mEq/L (ref 96–112)
Glucose, Bld: 102 mg/dL — ABNORMAL HIGH (ref 70–99)
Potassium: 4.1 mEq/L (ref 3.5–5.1)
Sodium: 133 mEq/L — ABNORMAL LOW (ref 135–145)

## 2010-12-14 LAB — HEMOGLOBIN AND HEMATOCRIT, BLOOD: HCT: 27.2 % — ABNORMAL LOW (ref 39.0–52.0)

## 2010-12-14 MED ORDER — BISACODYL 10 MG RE SUPP
10.0000 mg | Freq: Once | RECTAL | Status: AC
  Administered 2010-12-14: 10 mg via RECTAL
  Filled 2010-12-14: qty 1

## 2010-12-14 NOTE — Progress Notes (Signed)
1 Day Post-Op Subjective: Patient reports tolerating PO and pain control good.  He initially had mild nausea after being moved to floor last night but this has resolved.  He amb last night without difficulty.    Objective: Vital signs in last 24 hours: Temp:  [97.2 F (36.2 C)-99.4 F (37.4 C)] 99.4 F (37.4 C) (12/06 0647) Pulse Rate:  [34-125] 93  (12/06 0647) Resp:  [8-18] 18  (12/06 0647) BP: (120-162)/(57-94) 120/60 mmHg (12/06 0647) SpO2:  [83 %-100 %] 97 % (12/06 0647) Weight:  [62.143 kg (137 lb)] 137 lb (62.143 kg) (12/05 1536)  Intake/Output from previous day: 12/05 0701 - 12/06 0700 In: 2934.2 [P.O.:120; I.V.:2729.2; IV Piggyback:50] Out: 2620 [Urine:2400; Drains:120; Blood:100]  Physical Exam:  General:alert, cooperative and no distress Cardiovascular: RRR Lungs: faint crackles bases GI: soft, NT, ND; faint BS Incisions: minimal bloody drainage Urine: clear/yellow Extremities: SCDs in place; warm and well profused  Lab Results:  Basename 12/14/10 0500 12/13/10 1213 12/11/10 1500  HGB 9.2* 10.7* 10.5*  HCT 27.2* 30.9* 30.4*   BMET  Basename 12/14/10 0500 12/11/10 1500  NA 133* 134*  K 4.1 4.1  CL 99 99  CO2 25 23  GLUCOSE 102* 90  BUN 3* 5*  CREATININE 0.75 0.72  CALCIUM 8.1* 9.1    Assessment/Plan: 1 Day Post-Op, Procedure(s) (LRB): ROBOTIC ASSISTED LAPAROSCOPIC RADICAL PROSTATECTOMY (N/A)  Pt doing well POD#1 Acute on chronic anemia: mild; tolerating; monitor LGF: likely secondary to ATX; aggressive pulm toilet Likely D/C drain this am Ambulate, Incentive spirometry DVT prophylaxis Transition to PO pain medications SL IVF Continue clear liquids until some return of bowel function via flatus or BM.   Dulcolax supp this am Poss d/c later today pending progress   LOS: 1 day   YARBROUGH,Verne Lanuza G. 12/14/2010, 7:25 AM

## 2010-12-14 NOTE — Plan of Care (Signed)
Problem: Phase I Progression Outcomes Goal: Walk in halls when awake from anesthesia Outcome: Progressing Patient ambulated in the hall, denies any distress or pain.

## 2010-12-14 NOTE — Discharge Summary (Signed)
  Date of admission: 12/13/2010  Date of discharge: 12/14/2010  Admission diagnosis: Prostate Cancer  Discharge diagnosis: Prostate Cancer  History and Physical: For full details, please see admission history and physical. Briefly, Scott Lindsey is a 55 y.o. gentleman with localized prostate cancer.  After discussing management/treatment options, he elected to proceed with surgical treatment.  Hospital Course: Scott Lindsey was taken to the operating room on 12/13/2010 and underwent a robotic assisted laparoscopic radical prostatectomy. He tolerated this procedure well and without complications. Postoperatively, he was able to be transferred to a regular hospital room following recovery from anesthesia.  He was able to begin ambulating the night of surgery. He remained hemodynamically stable overnight.  He had excellent urine output with appropriately minimal output from his pelvic drain and his pelvic drain was removed on POD #1.  He was transitioned to oral pain medication, tolerated a clear liquid diet, and had met all discharge criteria and was able to be discharged home later on POD#1.  Laboratory values:  Basename 12/14/10 0500 12/13/10 1213  HGB 9.2* 10.7*  HCT 27.2* 30.9*    Disposition: Home  Discharge instruction: He was instructed to be ambulatory but to refrain from heavy lifting, strenuous activity, or driving. He was instructed on urethral catheter care.  Discharge medications:   Medication List  As of 12/14/2010  4:17 PM   START taking these medications         ciprofloxacin 500 MG tablet   Commonly known as: CIPRO   Take 1 tablet (500 mg total) by mouth 2 (two) times daily. Start day prior to office visit for foley removal      HYDROcodone-acetaminophen 5-325 MG per tablet   Commonly known as: NORCO   Take 1-2 tablets by mouth every 6 (six) hours as needed for pain.         STOP taking these medications         naproxen sodium 220 MG tablet          Where to get  your medications    These are the prescriptions that you need to pick up.   You may get these medications from any pharmacy.         ciprofloxacin 500 MG tablet   HYDROcodone-acetaminophen 5-325 MG per tablet            Followup: He will followup in 1 week for catheter removal and to discuss his surgical pathology results.

## 2010-12-19 ENCOUNTER — Encounter (HOSPITAL_COMMUNITY): Payer: Self-pay | Admitting: Urology

## 2011-06-22 ENCOUNTER — Encounter: Payer: Self-pay | Admitting: *Deleted

## 2013-02-01 ENCOUNTER — Emergency Department (INDEPENDENT_AMBULATORY_CARE_PROVIDER_SITE_OTHER)
Admission: EM | Admit: 2013-02-01 | Discharge: 2013-02-01 | Disposition: A | Discharge status: Home / Self Care | Payer: Self-pay | Source: Home / Self Care

## 2013-02-01 ENCOUNTER — Encounter (HOSPITAL_COMMUNITY): Payer: Self-pay | Admitting: Emergency Medicine

## 2013-02-01 ENCOUNTER — Emergency Department (INDEPENDENT_AMBULATORY_CARE_PROVIDER_SITE_OTHER): Payer: Self-pay

## 2013-02-01 DIAGNOSIS — S83421A Sprain of lateral collateral ligament of right knee, initial encounter: Secondary | ICD-10-CM

## 2013-02-01 DIAGNOSIS — S83429A Sprain of lateral collateral ligament of unspecified knee, initial encounter: Secondary | ICD-10-CM

## 2013-02-01 DIAGNOSIS — S8002XA Contusion of left knee, initial encounter: Secondary | ICD-10-CM

## 2013-02-01 DIAGNOSIS — S8000XA Contusion of unspecified knee, initial encounter: Secondary | ICD-10-CM

## 2013-02-01 NOTE — Discharge Instructions (Signed)
Contusion A contusion is a deep bruise. Contusions happen when an injury causes bleeding under the skin. Signs of bruising include pain, puffiness (swelling), and discolored skin. The contusion may turn blue, purple, or yellow. HOME CARE   Put ice on the injured area.  Put ice in a plastic bag.  Place a towel between your skin and the bag.  Leave the ice on for 15-20 minutes, 03-04 times a day.  Only take medicine as told by your doctor.  Rest the injured area.  If possible, raise (elevate) the injured area to lessen puffiness. GET HELP RIGHT AWAY IF:   You have more bruising or puffiness.  You have pain that is getting worse.  Your puffiness or pain is not helped by medicine. MAKE SURE YOU:   Understand these instructions.  Will watch your condition.  Will get help right away if you are not doing well or get worse. Document Released: 06/13/2007 Document Revised: 03/19/2011 Document Reviewed: 10/30/2010 [facility] Patient Information 2014 [place], [place].  Combined Knee Ligament Sprain Combined knee ligament sprain is a tear of more than one of the major ligaments of the knee. The four knee ligaments are the anterior cruciate ligament (ACL), posterior cruciate ligament (PCL), medial collateral ligament (MCL) and lateral collateral ligament (LCL). Ligaments connect bones. They often cross a joint to hold the bones together. The ligaments of the knee keep the thigh bone (femur) and shinbone (tibia) in alignment. These ligaments allow the joint to move within a certain range of motion. Movement outside this range causes a ligament strain. Injury to multiple ligaments at the same time results in difficulty playing sports and in daily living. The most common multiple knee ligament injury involves the ACL and MCL. SYMPTOMS   A "popping" sound heard or felt at the time of injury.  Inability to continue activity after injury.  Inflammation of the knee within 6 hours after  injury.  Possibly, deformity of the knee.  Inability to straighten the knee.  Feeling of the knee giving way or buckling.  Sometimes, locking of the knee, if the joint cartilage (meniscus) is injured.  Rarely, numbness, weakness, paralysis, discoloration, or coldness, due to nerve or blood vessel injury. CAUSES  Spraining of multiple ligaments occurs when a force is placed on the ligaments that exceeds their strength. This is often caused by a direct hit (trauma). It may also be caused by a non-contact injury (hyperextending the knee while twisting it).  RISK INCREASES WITH:  Contact sports (football, rugby, lacrosse). Sports that involve pivoting, jumping, cutting, or changing direction (basketball, gymnastics, soccer, volleyball). Sports on uneven ground (cross-country running, soccer).  Poor strength and/or flexibility.  Improper fitted or padded equipment. PREVENTION  Warm up and stretch properly before activity.  Maintain physical fitness:  Thigh, leg, and knee flexibility.  Muscle strength and endurance.  Learn and use proper exercise technique.  Wear proper and well fitting equipment (correct length of cleats for surface). PROGNOSIS  Without treatment, the knee will continue to give way and become vulnerable to recurring injury. Recurring injury can happen during athletics or daily living. If the injury includes damage to a nerve or artery, the chance of a poor outcome increases. Surgery is often needed to regain stability of the knee. RELATED COMPLICATIONS  Frequently recurring symptoms, including:  Knee giving way.  Joint instability.  Inflammation.  Injury to the joint cartilage (meniscus). This may result in locking and/or swelling of the knee.  Injury to joint (articular) cartilage of the  thigh bone or shinbone. This may result in arthritis of the knee.  Injury to other ligaments of the knee.  Knee stiffness (loss of knee motion).  Permanent injury to  nerves (numbness, weakness, or paralysis) or arteries.  Removal (amputation) of the leg, due to nerve or artery injury. TREATMENT  Treatment first involves medicine and ice, to reduce pain and inflammation. Crutches may be advised, to decrease pain while walking. The knee may be restrained. Rehabilitation focuses on reducing swelling, regaining range of motion, and regaining muscle control and strength. It may also include receiving proper use training, wearing a brace, and education. (Avoid sports that involve pivoting, cutting, changing direction, jumping and landing). Surgery often offers the best chance for full recovery. Surgery from combined ACL/MCL injury involves replacement (reconstruction) of the ACL. This also allows for MCL healing. Despite surgery, some athletes may never return to their prior level of competition. The ability to return to sports depends on the related injuries and demands of the sport.  MEDICATION   If pain medicine is needed, nonsteroidal anti-inflammatory medicines (aspirin and ibuprofen), or other minor pain relievers (acetaminophen), are often advised.  Do not take pain medicine for 7 days before surgery.  Stronger pain relievers may be prescribed. Use only as directed and only as much as you need.  Contact your caregiver immediately if any bleeding, stomach upset, or signs of an allergic reaction occur. COLD THERAPY  Cold treatment (icing) should be applied for 10 to 15 minutes every 2 to 3 hours for inflammation and pain, and immediately after activity that aggravates your symptoms. Use ice packs or an ice massage. SEEK MEDICAL CARE IF:   Symptoms get worse or do not improve in 6 weeks, despite treatment.  After injury or surgery, any of the following occur:  Pain, numbness, coldness, or a blue, gray, or dark color occurs in the foot or toenails.  Increased pain, swelling, redness, drainage of fluids, or bleeding in the affected area.  Signs of infection  (headache, muscle aches, dizziness, or a general ill feeling with fever).  New, unexplained symptoms develop. (Drugs used in treatment may produce side effects.) Document Released: 12/25/2004 Document Revised: 03/19/2011 Document Reviewed: 04/08/2008 [facility] Patient Information 2014 [place], [place].  Knee Effusion The medical term for having fluid in your knee is effusion. This is often due to an internal derangement of the knee. This means something is wrong inside the knee. Some of the causes of fluid in the knee may be torn cartilage, a torn ligament, or bleeding into the joint from an injury. Your knee is likely more difficult to bend and move. This is often because there is increased pain and pressure in the joint. The time it takes for recovery from a knee effusion depends on different factors, including:   Type of injury.  Your age.  Physical and medical conditions.  Rehabilitation Strategies. How long you will be away from your normal activities will depend on what kind of knee problem you have and how much damage is present. Your knee has two types of cartilage. Articular cartilage covers the bone ends and lets your knee bend and move smoothly. Two menisci, thick pads of cartilage that form a rim inside the joint, help absorb shock and stabilize your knee. Ligaments bind the bones together and support your knee joint. Muscles move the joint, help support your knee, and take stress off the joint itself. CAUSES  Often an effusion in the knee is caused by an injury to  one of the menisci. This is often a tear in the cartilage. Recovery after a meniscus injury depends on how much meniscus is damaged and whether you have damaged other knee tissue. Small tears may heal on their own with conservative treatment. Conservative means rest, limited weight bearing activity and muscle strengthening exercises. Your recovery may take up to 6 weeks.  TREATMENT  Larger tears may require surgery. Meniscus  injuries may be treated during arthroscopy. Arthroscopy is a procedure in which your surgeon uses a small telescope like instrument to look in your knee. Your caregiver can make a more accurate diagnosis (learning what is wrong) by performing an arthroscopic procedure. If your injury is on the inner margin of the meniscus, your surgeon may trim the meniscus back to a smooth rim. In other cases your surgeon will try to repair a damaged meniscus with stitches (sutures). This may make rehabilitation take longer, but may provide better long term result by helping your knee keep its shock absorption capabilities. Ligaments which are completely torn usually require surgery for repair. HOME CARE INSTRUCTIONS  Use crutches as instructed.  If a brace is applied, use as directed.  Once you are home, an ice pack applied to your swollen knee may help with discomfort and help decrease swelling.  Keep your knee raised (elevated) when you are not up and around or on crutches.  Only take over-the-counter or prescription medicines for pain, discomfort, or fever as directed by your caregiver.  Your caregivers will help with instructions for rehabilitation of your knee. This often includes strengthening exercises.  You may resume a normal diet and activities as directed. SEEK MEDICAL CARE IF:   There is increased swelling in your knee.  You notice redness, swelling, or increasing pain in your knee.  An unexplained oral temperature above 102 F (38.9 C) develops. SEEK IMMEDIATE MEDICAL CARE IF:   You develop a rash.  You have difficulty breathing.  You have any allergic reactions from medications you may have been given.  There is severe pain with any motion of the knee. MAKE SURE YOU:   Understand these instructions.  Will watch your condition.  Will get help right away if you are not doing well or get worse. Document Released: 03/17/2003 Document Revised: 03/19/2011 Document Reviewed:  05/21/2007 [facility] Patient Information 2014 [place].  Knee Bracing Knee braces are supports to help stabilize and protect an injured or painful knee. They come in many different styles. They should support and protect the knee without increasing the chance of other injuries to yourself or others. It is important not to have a false sense of security when using a brace. Knee braces that help you to keep using your knee:  Do not restore normal knee stability under high stress forces.  May decrease some aspects of athletic performance. Some of the different types of knee braces are:  Prophylactic knee braces are designed to prevent or reduce the severity of knee injuries during sports that make injury to the knee more likely.  Rehabilitative knee braces are designed to allow protected motion of:  Injured knees.  Knees that have been treated with or without surgery. There is no evidence that the use of a supportive knee brace protects the graft following a successful anterior cruciate ligament (ACL) reconstruction. However, braces are sometimes used to:   Protect injured ligaments.  Control knee movement during the initial healing period. They may be used as part of the treatment program for the various injured  ligaments or cartilage of the knee including the:  Anterior cruciate ligament.  Medial collateral ligament.  Medial or lateral cartilage (meniscus).  Posterior cruciate ligament.  Lateral collateral ligament. Rehabilitative knee braces are most commonly used:  During crutch-assisted walking right after injury.  During crutch-assisted walking right after surgery to repair the cartilage and/or cruciate ligament injury.  For a short period of time, 2-8 weeks, after the injury or surgery. The value of a rehabilitative brace as opposed to a cast or splint includes the:  Ability to adjust the brace for swelling.  Ability to remove the brace for examinations, icing  or showering.  Ability to allow for movement in a controlled range of motion. Functional knee braces give support to knees that have already been injured. They are designed to provide stability for the injured knee and provide protection after repair. Functional knee braces may not affect performance much. Lower extremity muscle strengthening, flexibility, and improvement in technique are more important than bracing in treating ligamentous knee injuries. Functional braces are not a substitute for rehabilitation or surgical procedures. Unloader/offloader braces are designed to provide pain relief in arthritic knees. Patients with wear and tear arthritis from growing old or from an old cartilage injury (osteoarthritis) of the knee, and bow legged (varus) or knock knee (valgus) deformities, often develop increased pain in the arthritic side due to increased loading. Unloader/offloader braces are made to reduce uneven loading in such knees. There is reduction in bowing out movement in bow legged knees when the correct unloader brace is used. Patients with advanced osteoarthritis or severe varus or valgus alignment problems would not likely benefit from bracing. Patellofemoral braces help the kneecap to move smoothly and well centered over the end of the femur in the knee.  Most people who wear knee braces feel that they help. However, there is a lack of scientific evidence that knee braces are helpful at the level needed for athletic participation to prevent injury. In spite of this, athletes report an increase in knee stability, pain relief, performance improvement, and confidence during athletics when using a brace.  Different knee problems require different knee braces:  Your caregiver may suggest one kind of knee brace after knee surgery.  A caregiver may choose another kind of knee brace for support instead of surgery for some types of torn ligaments.  You may also need one for pain in the front of  your knee that is not getting better with strengthening and flexibility exercises. Get your caregiver's advice if you want to try a knee brace. The caregiver will advise you on where to get them and provide a prescription when it is needed to fashion and/or fit the brace. Knee braces are the least important part of preventing knee injuries or getting better following injury. Stretching, strengthening and technique improvement are far more important in caring for and preventing knee injuries. When strengthening your knee, increase your activities a little at a time so as not to develop injuries from over use. Work out an exercise plan with your caregiver and/or physical therapist to get the best program for you. Do not let a knee brace become a crutch. Always remember, there are no braces which support the knee as well as your original ligaments and cartilage you were born with. Conditioning, proper warm-up and stretching remain the most important parts of keeping your knees healthy. HOW TO USE A KNEE BRACE  During sports, knee braces should be used as directed by your caregiver.  Make  sure that the hinges are where the knee bends.  Straps, tapes, or hook-and-loop tapes should be fastened around your leg as instructed.  You should check the placement of the brace during activities to make sure that it has not moved. Poorly positioned braces can hurt rather than help you.  To work well, a knee brace should be worn during all activities that put you at risk of knee injury.  Warm up properly before beginning athletic activities. HOME CARE INSTRUCTIONS  Knee braces often get damaged during normal use. Replace worn-out braces for maximum benefit.  Clean regularly with soap and water.  Inspect your brace often for wear and tear.  Cover exposed metal to protect others from injury.  Durable materials may cost more, but last longer. SEEK IMMEDIATE MEDICAL CARE IF:   Your knee seems to be getting  worse rather than better.  You have increasing pain or swelling in the knee.  You have problems caused by the knee brace.  You have increased swelling or inflammation (redness or soreness) in your knee.  Your knee becomes warm and more painful and you develop an unexplained temperature over 101 F (38.3 C). MAKE SURE YOU:   Understand these instructions.  Will watch your condition.  Will get help right away if you are not doing well or get worse. See your caregiver, physical therapist or orthopedic surgeon for additional information. Document Released: 03/17/2003 Document Revised: 03/19/2011 Document Reviewed: 06/23/2008 [facility] Patient Information 2014 [place], [place].  Lateral Collateral Knee Ligament Sprain with Phase I Rehab The lateral collateral ligament (LCL) of the knee helps hold the knee joint in proper alignment and prevents the bones from shifting out of alignment (displacing) toward the outside (laterally). Injury to the knee may cause a tear in the LCL ligament (sprain). The LCL is the least common ligament of the knee to be injured. Sprains may heal on their own, but they often result in a loose joint. Sprains are classified into three categories. Grade 1 sprains cause pain, but the tendon is not lengthened. Grade 2 sprains include a lengthened ligament, due to the ligament being stretched or partially ruptured. With grade 2 sprains there is still function, although the function may be decreased. Grade 3 sprains involve a complete tear of the tendon or muscle, and function is usually impaired. SYMPTOMS   Pain and tenderness on the outer side of the knee.  A "pop", tearing, or pulling sensation at the time of injury.  Bruising (contusion) at the site of injury within 48 hours of injury.  Knee stiffness.  Limping, often walking with the knee bent. CAUSES  An LCL sprain occurs when a force is placed on the ligament that is greater than it can handle. Common causes of  injury include:  Direct hit (trauma) to the inner side of the knee, especially if the foot is planted on the ground.  Forceful pivoting of the body and leg, while the foot is planted on the ground. RISK INCREASES WITH:  Contact sports (football, rugby).  Sports that require pivoting or cutting (soccer).  Poor knee strength and flexibility.  Improper equipment use. PREVENTION   Warm up and stretch properly before activity.  Maintain physical fitness:  Strength, flexibility, and endurance.  Cardiovascular fitness.  Wear properly fitted protective equipment (correct length of cleats for surface).  Functional braces may be effective in preventing injury. PROGNOSIS  If treated properly, LCL tears usually heal on their own. Sometimes, surgery is required. RELATED COMPLICATIONS  Frequently recurring symptoms, such as knee giving way, instability, and swelling.  Injury to other structures in the knee joint.  Meniscal cartilage, resulting in locking and swelling of the knee.  Articular cartilage, resulting in knee arthritis.  Other ligaments of the knee (commonly).  Injury to nerves, causing numbness of the outer leg, foot, and ankle and weakness or paralysis, with inability to raise the ankle, big toe, or lesser toes.  Knee stiffness (loss of knee motion). TREATMENT  Treatment first involves the use of ice and medicine, to reduce pain and inflammation. The use of strengthening and stretching exercises may help reduce pain with activity. These exercises may be performed at home, but referral to a therapist is often advised. You may be advised to walk with crutches, until you are able to walk without a limp. Your caregiver may provide you with a hinged knee brace to help regain a full range of motion, while also protecting the injured knee. For severe LCL injuries, or injuries that involve other ligaments of the knee, surgery is often advised. MEDICATION   If pain medicine is  needed, nonsteroidal anti-inflammatory medicines (aspirin and ibuprofen), or other minor pain relievers (acetaminophen), are often advised.  Do not take pain medicine for 7 days before surgery.  Prescription pain relievers may be given, if your caregiver thinks they are needed. Use only as directed and only as much as you need. HEAT AND COLD  Cold treatment (icing) should be applied for 10 to 15 minutes every 2 to 3 hours for inflammation and pain, and immediately after activity that aggravates your symptoms. Use ice packs or an ice massage.  Heat treatment may be used before performing stretching and strengthening activities prescribed by your caregiver, physical therapist, or athletic trainer. Use a heat pack or a warm water soak. SEEK MEDICAL CARE IF:   Symptoms get worse or do not improve in 4 to 6 weeks, despite treatment.  New, unexplained symptoms develop. (Drugs used in treatment may produce side effects.) EXERCISES RANGE OF MOTION (ROM) AND STRETCHING EXERCISES - Lateral Collateral Knee Ligament Sprain Phase I These are some of the initial exercises that your physician, physical therapist or athletic trainer may have you perform to begin your rehabilitation. When you demonstrate gains in your flexibility and strength, your caregiver may progress you to Phase II exercises. As you perform these exercises, remember:   These initial exercises are intended to be gentle. They will help you restore motion without increasing any swelling.  Completing these exercises allows less painful movement and prepares you for the more aggressive strengthening exercises in Phase II.  An effective stretch should be held for at least 30 seconds.  A stretch should never be painful. You should only feel a gentle lengthening or release in the stretched tissue. RANGE OF MOTION - Knee Flexion, Active  Lie on your back with both knees straight. (If this causes back discomfort, bend your opposite knee,  placing your foot flat on the floor.)  Slowly slide your heel back toward your buttocks until you feel a gentle stretch in the front of your knee or thigh.  Hold for __________ seconds. Slowly slide your heel back to the starting position. Repeat __________ times. Complete this exercise __________ times per day.  STRETCH - Knee Flexion, Supine  Lie on the floor with your right / left heel and foot lightly touching the wall. (Place both feet on the wall, if you do not use a door frame.)  Without using any  effort, allow gravity to slide your foot down the wall slowly until you feel a gentle stretch in the front of your right / left knee.  Hold this stretch for __________ seconds. Then return the leg to the starting position, using your healthy leg for help, if needed. Repeat __________ times. Complete this stretch __________ times per day.  RANGE OF MOTION - Knee Flexion and Extension, Active-Assisted  Sit on the edge of a table or chair with your thighs firmly supported. It may be helpful to place a folded towel under the end of your right / left thigh.  Flexion (bending): Place the ankle of your healthy leg on top of the other ankle. Use your healthy leg to gently bend your right / left knee until you feel a mild tension across the top of your knee.  Hold for __________ seconds.  Extension (straightening): Switch your ankles so your right / left leg is on top. Use your healthy leg to straighten your right / left knee until you feel a mild tension on the backside of your knee.  Hold for __________ seconds. Repeat __________ times. Complete this exercise __________ times per day. STRETCH - Knee Extension Sitting  Sit with yourright / left leg/heel propped on another chair, coffee table, or foot stool.  Allow your leg muscles to relax, letting gravity straighten out your knee.*  You should feel a stretch behind your right / left knee. Hold this position for __________ seconds. Repeat  __________ times. Complete this stretch __________ times per day.  *Your physician, physical therapist or athletic trainer may instruct you place a __________ weight on your thigh, just above your kneecap, to deepen the stretch.  STRENGTHENING EXERCISES Lateral Collateral Knee Ligament Sprain - Phase I These exercises may help you when beginning to rehabilitate your injury. They may resolve your symptoms with or without further involvement from your physician, physical therapist or athletic trainer. While completing these exercises, remember:   Muscles can gain both the endurance and the strength needed for everyday activities through controlled exercises.  Complete these exercises as instructed by your physician, physical therapist or athletic trainer. Increase the resistance and repetitions only as guided.  In order to return to more demanding activities, you will likely need to progress to more challenging exercises. Your physician, physical therapist or athletic trainer will advance your exercises when your tissues show adequate healing and your muscles demonstrate increased strength. STRENGTH - Quadriceps, Isometrics  Lie on your back with your right / left leg extended and your opposite knee bent.  Gradually tense the muscles in the front of yourright / left thigh. You should see either your knee cap slide up toward your hip or increased dimpling just above the knee. This motion will push the back of the knee down toward the floor, mat, or bed on which you are lying.  Hold the muscle as tight as you can without increasing your pain for __________ seconds.  Relax the muscles slowly and completely between each repetition. Repeat __________ times. Complete this exercise __________ times per day.  STRENGTH - Quadriceps, Short Arcs   Lie on your back. Place a __________ inch towel roll under your right / left knee, so that the knee bends slightly.  Raise only your lower leg by tightening the  muscles in the front of your thigh. Do not allow your thigh to rise.  Hold this position for __________ seconds. Repeat __________ times. Complete this exercise __________ times per day.  OPTIONAL ANKLE  WEIGHTS: Begin with ____________________, but DO NOT exceed ____________________. Increase in 1 pound/0.5 kilogram increments. STRENGTH - Quadriceps, Straight Leg Raises  Quality counts! Watch for signs that the quadriceps muscle is working, to be sure you are strengthening the correct muscles and not "cheating" by substituting with healthier muscles.  Lay on your back with your right / left leg extended and your opposite knee bent.  Tense the muscles in the front of your right / leftthigh. You should see either your knee cap slide up or increased dimpling just above the knee. Your thigh may even shake a bit.  Tighten these muscles even more and raise your leg 4 to 6 inches off the floor. Hold for __________ seconds.  Keeping these muscles tense, lower your leg.  Relax the muscles slowly and completely in between each repetition. Repeat __________ times. Complete this exercise __________ times per day.  STRENGTH - Hamstring, Isometrics   Lie on your back, on a firm surface.  Bend your right / left knee approximately __________ degrees.  Dig your heel into the surface as if you are trying to pull it toward your buttocks. Tighten the muscles in the back of your thighs to "dig" as hard as you can, without increasing any pain.  Hold this position for __________ seconds.  Release the tension gradually and allow your muscle to completely relax for __________ seconds in between each exercise. Repeat __________ times. Complete this exercise __________ times per day.  STRENGTH - Hamstring, Curls   Lay on your stomach with your legs extended. (If you lay on a bed, your feet may hang over the edge.)  Tighten the muscles in the back of your thigh to bend your right / left knee up to 90 degrees.  Keep your hips flat on the bed.  Hold this position for __________ seconds.  Slowly lower your leg back to the starting position. Repeat __________ times. Complete this exercise __________ times per day.  OPTIONAL ANKLE WEIGHTS: Begin with ____________________, but DO NOT exceed ____________________. Increase in 1 pound/0.5 kilogram increments. Document Released: 12/25/2004 Document Revised: 03/19/2011 Document Reviewed: 04/08/2008 [facility] Patient Information 2014 [place], [place].

## 2013-02-01 NOTE — ED Notes (Signed)
Reports falling down several carpeted stairs in middle of night; was having severe left knee pain; now is able to put some slight pressure on LLE, but now has some right knee pain and is unable to bear any weight on RLE.  Denies any other injuries.  Has used BenGay and taken Aleve.

## 2013-02-01 NOTE — ED Provider Notes (Signed)
CSN: [id number]     Arrival date & time 02/01/13  1308 History   First MD Initiated Contact with Patient 02/01/13 1501     Chief Complaint  Patient presents with  . Fall   (Consider location/radiation/quality/duration/timing/severity/associated sxs/prior Treatment) HPI Comments: As above. The patient is complaining of bilateral knee pain. The left knee has been giving better. Since he fell he was able to get up and climb the stairs and go back to bed. Bicycling hour later he developed pain in his knees. The pain has been increasing in the right knee throughout the day and decreasing in the left knee. He denies injury to the head, neck, back, upper extremities or hip.   History reviewed. No pertinent past medical history. Past Surgical History  Procedure Laterality Date  . Multiple tooth extractions    . Robot assisted laparoscopic radical prostatectomy  12/13/2010    Procedure: ROBOTIC ASSISTED LAPAROSCOPIC RADICAL PROSTATECTOMY;  Surgeon: Bernestine Amass, MD;  Location: [facility];  Service: Urology;  Laterality: N/A;  with Bilateral Pelivic Lymph Node Dissection   No family history on file. History  Substance Use Topics  . Smoking status: Current Every Day Smoker -- 0.50 packs/day for 25 years    Types: Cigarettes  . Smokeless tobacco: Never Used  . Alcohol Use: 0.0 oz/week     Comment: occasional    Review of Systems  Constitutional: Negative.   Respiratory: Negative.   Gastrointestinal: Negative.   Genitourinary: Negative.   Musculoskeletal:       As per HPI  Skin: Negative.   Neurological: Negative for dizziness, weakness, numbness and headaches.    Allergies  Review of patient's allergies indicates no known allergies.  Home Medications  No current outpatient prescriptions on file. BP 153/77  Pulse 79  Temp(Src) 99.3 F (37.4 C) (Oral)  Resp 16  SpO2 100% Physical Exam  Constitutional: He is oriented to person, place, and time. He appears well-developed and  well-nourished.  HENT:  Head: Normocephalic and atraumatic.  Eyes: EOM are normal. Left eye exhibits no discharge.  Neck: Normal range of motion. Neck supple.  Musculoskeletal:  Examination of the left knee reveals minor tenderness over the anterior tibia. No swelling, deformity or discoloration. There is normal flexion and extension. He is able to support his weight on the left leg minimal discomfort. Right knee reveals minor swelling just superior to the patella. He is able to extend the right knee completely and able to flex to 90 but not beyond. Tenderness along the lateral aspect of the knee joint. Tenderness runs longitudinally. There is no tenderness anteriorly or to the medial aspect. He is able to stand however the axis standing causes pain to the lateral aspect of the right knee. Distal neurovascular motor sensory is intact.  Neurological: He is alert and oriented to person, place, and time. No cranial nerve deficit.  Skin: Skin is warm and dry.  Psychiatric: He has a normal mood and affect.    ED Course  Procedures (including critical care time) Labs Review Labs Reviewed - No data to display Imaging Review Dg Knee Complete 4 Views Left  02/01/2013   CLINICAL DATA:  Fall down stairs.  Knee injury and pain.  EXAM: LEFT KNEE - COMPLETE 4+ VIEW  COMPARISON:  None.  FINDINGS: There is no evidence of fracture, dislocation, or joint effusion. There is no evidence of arthropathy or other significant bone abnormality. Soft tissues are unremarkable.  IMPRESSION: No acute findings.   Electronically Signed  By: Earle Gell M.D.   On: 02/01/2013 14:54   Dg Knee Complete 4 Views Right  02/01/2013   CLINICAL DATA:  Pain.  EXAM: RIGHT KNEE - COMPLETE 4+ VIEW  COMPARISON:  No prior.  FINDINGS: Mild degenerative changes noted about the right knee. Tiny knee joint effusion cannot be excluded. No acute abnormality identified.  IMPRESSION: Degenerative changes right knee. Tiny knee joint effusion. No  acute abnormality .   Electronically Signed   By: Marcello Moores  Register   On: 02/01/2013 14:51      MDM   1. Sprain of lateral collateral ligament of right knee   2. Contusion of left knee     Ice the areas of pain. Knee immobilizer to the right leg. May remove it and perform slow extension and flexion movements. Followup with the orthopedist above for reevaluation next week. May take Aleve for pain as requested In any symptoms problems or worsening may return.   Janne Napoleon, NP 02/01/13 [phone]

## 2013-02-02 NOTE — ED Provider Notes (Signed)
Medical screening examination/treatment/procedure(s) were performed by resident physician or non-physician practitioner and as supervising physician I was immediately available for consultation/collaboration.   Pauline Good MD.   Billy Fischer, MD 02/02/13 1435

## 2014-06-04 DIAGNOSIS — H2513 Age-related nuclear cataract, bilateral: Secondary | ICD-10-CM | POA: Diagnosis not present

## 2014-06-04 DIAGNOSIS — H40033 Anatomical narrow angle, bilateral: Secondary | ICD-10-CM | POA: Diagnosis not present

## 2014-06-08 ENCOUNTER — Emergency Department (HOSPITAL_COMMUNITY): Payer: Medicare Other

## 2014-06-08 ENCOUNTER — Emergency Department (HOSPITAL_COMMUNITY)
Admission: EM | Admit: 2014-06-08 | Discharge: 2014-06-08 | Disposition: A | Discharge status: Home / Self Care | Payer: Medicare Other | Attending: Emergency Medicine | Admitting: Emergency Medicine

## 2014-06-08 ENCOUNTER — Encounter (HOSPITAL_COMMUNITY): Payer: Self-pay

## 2014-06-08 DIAGNOSIS — K859 Acute pancreatitis, unspecified: Secondary | ICD-10-CM | POA: Diagnosis not present

## 2014-06-08 DIAGNOSIS — K852 Alcohol induced acute pancreatitis without necrosis or infection: Secondary | ICD-10-CM

## 2014-06-08 DIAGNOSIS — Z720 Tobacco use: Secondary | ICD-10-CM | POA: Diagnosis not present

## 2014-06-08 DIAGNOSIS — Z791 Long term (current) use of non-steroidal anti-inflammatories (NSAID): Secondary | ICD-10-CM | POA: Insufficient documentation

## 2014-06-08 DIAGNOSIS — R1013 Epigastric pain: Secondary | ICD-10-CM

## 2014-06-08 LAB — COMPREHENSIVE METABOLIC PANEL
ALBUMIN: 4.3 g/dL (ref 3.5–5.0)
ALK PHOS: 86 U/L (ref 38–126)
ALT: 43 U/L (ref 17–63)
ANION GAP: 17 — AB (ref 5–15)
AST: 84 U/L — AB (ref 15–41)
BILIRUBIN TOTAL: 0.5 mg/dL (ref 0.3–1.2)
BUN: 7 mg/dL (ref 6–20)
CHLORIDE: 99 mmol/L — AB (ref 101–111)
CO2: 18 mmol/L — ABNORMAL LOW (ref 22–32)
Calcium: 8.8 mg/dL — ABNORMAL LOW (ref 8.9–10.3)
Creatinine, Ser: 0.85 mg/dL (ref 0.61–1.24)
GFR calc Af Amer: >60 mL/min (ref >60)
GFR calc non Af Amer: >60 mL/min (ref >60)
Glucose, Bld: 112 mg/dL — ABNORMAL HIGH (ref 65–99)
POTASSIUM: 3.7 mmol/L (ref 3.5–5.1)
SODIUM: 134 mmol/L — AB (ref 135–145)
TOTAL PROTEIN: 8.8 g/dL — AB (ref 6.5–8.1)

## 2014-06-08 LAB — CBC WITH DIFFERENTIAL/PLATELET
BASOS PCT: 0 % (ref 0–1)
Basophils Absolute: 0 10*3/uL (ref 0.0–0.1)
EOS ABS: 0 10*3/uL (ref 0.0–0.7)
EOS PCT: 0 % (ref 0–5)
HCT: 38.6 % — ABNORMAL LOW (ref 39.0–52.0)
Hemoglobin: 13.2 g/dL (ref 13.0–17.0)
Lymphocytes Relative: 28 % (ref 12–46)
Lymphs Abs: 1.4 10*3/uL (ref 0.7–4.0)
MCH: 34.1 pg — AB (ref 26.0–34.0)
MCHC: 34.2 g/dL (ref 30.0–36.0)
MCV: 99.7 fL (ref 78.0–100.0)
Monocytes Absolute: 0.4 10*3/uL (ref 0.1–1.0)
Monocytes Relative: 9 % (ref 3–12)
NEUTROS PCT: 63 % (ref 43–77)
Neutro Abs: 3 10*3/uL (ref 1.7–7.7)
PLATELETS: 127 10*3/uL — AB (ref 150–400)
RBC: 3.87 MIL/uL — ABNORMAL LOW (ref 4.22–5.81)
RDW: 14.5 % (ref 11.5–15.5)
WBC: 4.8 10*3/uL (ref 4.0–10.5)

## 2014-06-08 LAB — LIPASE, BLOOD: LIPASE: 531 U/L — AB (ref 22–51)

## 2014-06-08 MED ORDER — ONDANSETRON HCL 4 MG/2ML IJ SOLN
4.0000 mg | Freq: Once | INTRAMUSCULAR | Status: AC
  Administered 2014-06-08: 4 mg via INTRAVENOUS
  Filled 2014-06-08: qty 2

## 2014-06-08 MED ORDER — OXYCODONE-ACETAMINOPHEN 5-325 MG PO TABS: 1.0000 | ORAL_TABLET | Freq: Four times a day (QID) | ORAL | Status: DC | PRN

## 2014-06-08 MED ORDER — MORPHINE SULFATE 4 MG/ML IJ SOLN
4.0000 mg | Freq: Once | INTRAMUSCULAR | Status: AC
  Administered 2014-06-08: 4 mg via INTRAVENOUS
  Filled 2014-06-08: qty 1

## 2014-06-08 MED ORDER — ONDANSETRON 8 MG PO TBDP: ORAL_TABLET | ORAL | Status: DC

## 2014-06-08 MED ORDER — SODIUM CHLORIDE 0.9 % IV BOLUS (SEPSIS)
1000.0000 mL | Freq: Once | INTRAVENOUS | Status: AC
  Administered 2014-06-08: 1000 mL via INTRAVENOUS

## 2014-06-08 NOTE — ED Provider Notes (Signed)
CSN: [id number]     Arrival date & time 06/08/14  0601 History   First MD Initiated Contact with Patient 06/08/14 [phone]     Chief Complaint  Patient presents with  . Abdominal Pain     (Consider location/radiation/quality/duration/timing/severity/associated sxs/prior Treatment) HPI Comments: Patient is a 59 year old male with past medical history of prostatectomy. He presents for evaluation of epigastric pain which started approximately 2:30 AM while he was trying to go to sleep. He has been up all night with the pain and has no relief with antiacids. He denies fevers or chills. He did begin vomiting this morning shortly after arriving to the ER.  Patient is a 59 y.o. male presenting with abdominal pain. The history is provided by the patient.  Abdominal Pain Pain location:  Epigastric Pain quality: cramping   Pain radiates to:  Does not radiate Pain severity:  Moderate Onset quality:  Sudden Duration:  5 hours Timing:  Constant Progression:  Unchanged Chronicity:  New Relieved by:  OTC medications Worsened by:  Nothing tried   History reviewed. No pertinent past medical history. Past Surgical History  Procedure Laterality Date  . Multiple tooth extractions    . Robot assisted laparoscopic radical prostatectomy  12/13/2010    Procedure: ROBOTIC ASSISTED LAPAROSCOPIC RADICAL PROSTATECTOMY;  Surgeon: Bernestine Amass, MD;  Location: [facility];  Service: Urology;  Laterality: N/A;  with Bilateral Pelivic Lymph Node Dissection   History reviewed. No pertinent family history. History  Substance Use Topics  . Smoking status: Current Every Day Smoker -- 0.50 packs/day for 25 years    Types: Cigarettes  . Smokeless tobacco: Never Used  . Alcohol Use: 0.0 oz/week     Comment: occasional    Review of Systems  Gastrointestinal: Positive for abdominal pain.  All other systems reviewed and are negative.     Allergies  Review of patient's allergies indicates no known allergies.  Home  Medications   Prior to Admission medications   Medication Sig Start Date End Date Taking? Authorizing Provider  naproxen sodium (ANAPROX) 220 MG tablet Take 220 mg by mouth 2 (two) times daily as needed.   Yes Historical Provider, MD   BP 144/79 mmHg  Pulse 82  Temp(Src) 98.1 F (36.7 C) (Oral)  Resp 22  SpO2 100% Physical Exam  Constitutional: He is oriented to person, place, and time. He appears well-developed and well-nourished. No distress.  HENT:  Head: Normocephalic and atraumatic.  Mouth/Throat: Oropharynx is clear and moist.  Neck: Normal range of motion. Neck supple.  Cardiovascular: Normal rate, regular rhythm and normal heart sounds.   No murmur heard. Pulmonary/Chest: Effort normal and breath sounds normal. No respiratory distress. He has no wheezes.  Abdominal: Soft. Bowel sounds are normal. He exhibits no distension. There is tenderness. There is no rebound and no guarding.  There is tenderness to palpation in the epigastric region.  Musculoskeletal: Normal range of motion. He exhibits no edema.  Lymphadenopathy:    He has no cervical adenopathy.  Neurological: He is alert and oriented to person, place, and time.  Skin: Skin is warm and dry. He is not diaphoretic.  Nursing note and vitals reviewed.   ED Course  Procedures (including critical care time) Labs Review Labs Reviewed - No data to display  Imaging Review No results found.   EKG Interpretation None      MDM   Final diagnoses:  Epigastric pain    Patient presents with epigastric pain. His workup reveals pancreatitis with no  evidence on his ultrasound for bile or pancreatic duct obstruction. From further conversation with this patient, it sounds as though he drinks a significant quantity of alcohol every day and I suspect that is the etiology of his pancreatitis. He has been offered admission, however is refusing this and prefers to go home. I will give him pain medication and nausea medication  and advised him to follow a clear liquid diet for the next several days. He is to return as needed if he worsens.    Veryl Speak, MD 06/08/14 1004

## 2014-06-08 NOTE — Discharge Instructions (Signed)
Percocet as prescribed as needed for pain. Zofran as prescribed as needed for nausea.  Adhere to a clear liquid diet for the next several days, then slowly advance.  Return to the emergency department if you develop worsening pain, fever, bloody vomit or stool, or other new and concerning symptoms.   Acute Pancreatitis Acute pancreatitis is a disease in which the pancreas becomes suddenly inflamed. The pancreas is a large gland located behind your stomach. The pancreas produces enzymes that help digest food. The pancreas also releases the hormones glucagon and insulin that help regulate blood sugar. Damage to the pancreas occurs when the digestive enzymes from the pancreas are activated and begin attacking the pancreas before being released into the intestine. Most acute attacks last a couple of days and can cause serious complications. Some people become dehydrated and develop low blood pressure. In severe cases, bleeding into the pancreas can lead to shock and can be life-threatening. The lungs, heart, and kidneys may fail. CAUSES  Pancreatitis can happen to anyone. In some cases, the cause is unknown. Most cases are caused by:  Alcohol abuse.  Gallstones. Other less common causes are:  Certain medicines.  Exposure to certain chemicals.  Infection.  Damage caused by an accident (trauma).  Abdominal surgery. SYMPTOMS   Pain in the upper abdomen that may radiate to the back.  Tenderness and swelling of the abdomen.  Nausea and vomiting. DIAGNOSIS  Your caregiver will perform a physical exam. Blood and stool tests may be done to confirm the diagnosis. Imaging tests may also be done, such as X-rays, CT scans, or an ultrasound of the abdomen. TREATMENT  Treatment usually requires a stay in the hospital. Treatment may include:  Pain medicine.  Fluid replacement through an intravenous line (IV).  Placing a tube in the stomach to remove stomach contents and control  vomiting.  Not eating for 3 or 4 days. This gives your pancreas a rest, because enzymes are not being produced that can cause further damage.  Antibiotic medicines if your condition is caused by an infection.  Surgery of the pancreas or gallbladder. HOME CARE INSTRUCTIONS   Follow the diet advised by your caregiver. This may involve avoiding alcohol and decreasing the amount of fat in your diet.  Eat smaller, more frequent meals. This reduces the amount of digestive juices the pancreas produces.  Drink enough fluids to keep your urine clear or pale yellow.  Only take over-the-counter or prescription medicines as directed by your caregiver.  Avoid drinking alcohol if it caused your condition.  Do not smoke.  Get plenty of rest.  Check your blood sugar at home as directed by your caregiver.  Keep all follow-up appointments as directed by your caregiver. SEEK MEDICAL CARE IF:   You do not recover as quickly as expected.  You develop new or worsening symptoms.  You have persistent pain, weakness, or nausea.  You recover and then have another episode of pain. SEEK IMMEDIATE MEDICAL CARE IF:   You are unable to eat or keep fluids down.  Your pain becomes severe.  You have a fever or persistent symptoms for more than 2 to 3 days.  You have a fever and your symptoms suddenly get worse.  Your skin or the white part of your eyes turn yellow (jaundice).  You develop vomiting.  You feel dizzy, or you faint.  Your blood sugar is high (over 300 mg/dL). MAKE SURE YOU:   Understand these instructions.  Will watch your condition.  Will get help right away if you are not doing well or get worse. Document Released: 12/25/2004 Document Revised: 06/26/2011 Document Reviewed: 04/05/2011 [facility] Patient Information 2015 [place], [place]. This information is not intended to replace advice given to you by your health care provider. Make sure you discuss any questions you have with  your health care provider.

## 2014-06-08 NOTE — ED Notes (Signed)
Pt is now vomiting in triage, wife states that he ate watermelon prior to going to bed

## 2014-06-08 NOTE — ED Notes (Signed)
Pt complains of abdominal pain and cramping since 3am, unable to belch or vomit

## 2015-12-25 ENCOUNTER — Ambulatory Visit (INDEPENDENT_AMBULATORY_CARE_PROVIDER_SITE_OTHER): Payer: Medicare Other

## 2015-12-25 ENCOUNTER — Encounter (HOSPITAL_COMMUNITY): Payer: Self-pay | Admitting: Emergency Medicine

## 2015-12-25 ENCOUNTER — Ambulatory Visit (HOSPITAL_COMMUNITY)
Admission: EM | Admit: 2015-12-25 | Discharge: 2015-12-25 | Disposition: A | Discharge status: Home / Self Care | Payer: Medicare Other | Attending: Emergency Medicine | Admitting: Emergency Medicine

## 2015-12-25 DIAGNOSIS — S40012A Contusion of left shoulder, initial encounter: Secondary | ICD-10-CM

## 2015-12-25 DIAGNOSIS — S20212A Contusion of left front wall of thorax, initial encounter: Secondary | ICD-10-CM

## 2015-12-25 DIAGNOSIS — S4992XA Unspecified injury of left shoulder and upper arm, initial encounter: Secondary | ICD-10-CM | POA: Diagnosis not present

## 2015-12-25 DIAGNOSIS — S299XXA Unspecified injury of thorax, initial encounter: Secondary | ICD-10-CM | POA: Diagnosis not present

## 2015-12-25 DIAGNOSIS — R079 Chest pain, unspecified: Secondary | ICD-10-CM | POA: Diagnosis not present

## 2015-12-25 DIAGNOSIS — M25512 Pain in left shoulder: Secondary | ICD-10-CM | POA: Diagnosis not present

## 2015-12-25 HISTORY — DX: Dorsalgia, unspecified: M54.9

## 2015-12-25 MED ORDER — NAPROXEN 500 MG PO TABS: 500.0000 mg | ORAL_TABLET | Freq: Two times a day (BID) | ORAL | 0 refills | Status: DC | PRN

## 2015-12-25 NOTE — Discharge Instructions (Signed)
Your x-rays are negative for fracture. Take Naprosyn twice a day as needed for pain. Apply ice to the sore areas for the next 2 days. Make sure you are taking a deep breath every hour to prevent pneumonia. Wear the sling for comfort. Do gentle range of motion at least 3 times a day. Follow-up as needed.

## 2015-12-25 NOTE — ED Triage Notes (Addendum)
Fell last night.  landed on hard wood floor.  Left upper chest soreness and left shoulder.  Limited movement of left shoulder.  Pain when arm is at shoulder level.  Left chest hurts with cough, sneezing, and/or laugh.  Left side of mouth swollen and painful.  Patient broke dentures.

## 2015-12-25 NOTE — ED Provider Notes (Signed)
[place]    CSN: [id number] Arrival date & time: 12/25/15  1215     History   Chief Complaint Chief Complaint  Patient presents with  . Chest Injury    HPI Scott Lindsey is a 60 y.o. male.   HPI  He is a 60 year old man here for evaluation of left rib pain. He states he fell last night, landing on his left chest and shoulder. He has had pain in the left lateral chest wall. This is worse with laughing, coughing up a sneezing, and deep breathing. He has not seen any breathing. No shortness of breath. He also reports pain in the anterior shoulder. This is worse with abduction beyond 90.  Past Medical History:  Diagnosis Date  . Back pain     Patient Active Problem List   Diagnosis Date Noted  . Prostate cancer ([place]) 12/12/2010    Past Surgical History:  Procedure Laterality Date  . MULTIPLE TOOTH EXTRACTIONS    . ROBOT ASSISTED LAPAROSCOPIC RADICAL PROSTATECTOMY  12/13/2010   Procedure: ROBOTIC ASSISTED LAPAROSCOPIC RADICAL PROSTATECTOMY;  Surgeon: Bernestine Amass, MD;  Location: [facility];  Service: Urology;  Laterality: N/A;  with Bilateral Pelivic Lymph Node Dissection       Home Medications    Prior to Admission medications   Medication Sig Start Date End Date Taking? Authorizing Provider  naproxen (NAPROSYN) 500 MG tablet Take 1 tablet (500 mg total) by mouth 2 (two) times daily as needed. 12/25/15   Melony Overly, MD    Family History History reviewed. No pertinent family history.  Social History Social History  Substance Use Topics  . Smoking status: Current Every Day Smoker    Packs/day: 0.50    Years: 25.00    Types: Cigarettes  . Smokeless tobacco: Never Used  . Alcohol use 0.0 oz/week     Comment: occasional     Allergies   Patient has no known allergies.   Review of Systems Review of Systems As in history of present illness  Physical Exam Triage Vital Signs ED Triage Vitals  Enc Vitals Group     BP 12/25/15 1417 159/87     Pulse Rate 12/25/15 1417 97     Resp 12/25/15 1417 22     Temp 12/25/15 1417 99 F (37.2 C)     Temp Source 12/25/15 1417 Oral     SpO2 12/25/15 1417 100 %     Weight --      Height --      Head Circumference --      Peak Flow --      Pain Score 12/25/15 1416 10     Pain Loc --      Pain Edu? --      Excl. in [place]? --    No data found.   Updated Vital Signs BP 159/87 (BP Location: Left Arm)   Pulse 97   Temp 99 F (37.2 C) (Oral)   Resp 22   SpO2 100% Comment: Ear Lobe   Visual Acuity Right Eye Distance:   Left Eye Distance:   Bilateral Distance:    Right Eye Near:   Left Eye Near:    Bilateral Near:     Physical Exam  Constitutional: He is oriented to person, place, and time. He appears well-developed and well-nourished. No distress.  Cardiovascular: Normal rate.   Pulmonary/Chest: Effort normal. He exhibits tenderness (left anterior and lateral).  Musculoskeletal:  Left shoulder: No erythema or edema. No  obvious deformity. He is quite tender over the bicipital groove area. No clavicular tenderness. Range of motion limited to about 60 of abduction. 2+ radial pulse.  Neurological: He is alert and oriented to person, place, and time.     UC Treatments / Results  Labs (all labs ordered are listed, but only abnormal results are displayed) Labs Reviewed - No data to display  EKG  EKG Interpretation None       Radiology Dg Ribs Unilateral W/chest Left  Result Date: 12/25/2015 CLINICAL DATA:  Pain after fall. EXAM: LEFT RIBS AND CHEST - 3+ VIEW COMPARISON:  None. FINDINGS: No pneumothorax. Hyperinflation of lungs consistent with COPD. The heart, hila, and mediastinum are unremarkable. No pulmonary nodules or masses. Mild atelectasis or fibrosis in the bases. No fractures. IMPRESSION: No fractures. Electronically Signed   By: Dorise Bullion III M.D   On: 12/25/2015 14:59   Dg Shoulder Left  Result Date: 12/25/2015 CLINICAL DATA:  Pain after fall EXAM: LEFT  SHOULDER - 2+ VIEW COMPARISON:  None. FINDINGS: There is no evidence of fracture or dislocation. There is no evidence of arthropathy or other focal bone abnormality. Soft tissues are unremarkable. IMPRESSION: Negative. Electronically Signed   By: Dorise Bullion III M.D   On: 12/25/2015 15:01    Procedures Procedures (including critical care time)  Medications Ordered in UC Medications - No data to display   Initial Impression / Assessment and Plan / UC Course  I have reviewed the triage vital signs and the nursing notes.  Pertinent labs & imaging results that were available during my care of the patient were reviewed by me and considered in my medical decision making (see chart for details).  Clinical Course     No fracture on x-ray. Symptomatic treatment with Naprosyn twice a day as needed for pain. Patient declined stronger pain medicine as it affects him too strongly. Sling for comfort. Range of motion at least 3 times a day. Discussed importance of deep breaths to prevent pneumonia. Follow-up as needed.  Final Clinical Impressions(s) / UC Diagnoses   Final diagnoses:  Rib contusion, left, initial encounter  Contusion of left shoulder, initial encounter    New Prescriptions Discharge Medication List as of 12/25/2015  3:13 PM    START taking these medications   Details  naproxen (NAPROSYN) 500 MG tablet Take 1 tablet (500 mg total) by mouth 2 (two) times daily as needed., Starting Sun 12/25/2015, Normal         Melony Overly, MD 12/25/15 1525

## 2016-02-27 ENCOUNTER — Ambulatory Visit (HOSPITAL_COMMUNITY)
Admission: EM | Admit: 2016-02-27 | Discharge: 2016-02-27 | Disposition: A | Discharge status: Home / Self Care | Payer: Medicare Other | Attending: Internal Medicine | Admitting: Internal Medicine

## 2016-02-27 ENCOUNTER — Encounter (HOSPITAL_COMMUNITY): Payer: Self-pay | Admitting: Emergency Medicine

## 2016-02-27 DIAGNOSIS — K439 Ventral hernia without obstruction or gangrene: Secondary | ICD-10-CM

## 2016-02-27 DIAGNOSIS — R109 Unspecified abdominal pain: Secondary | ICD-10-CM | POA: Diagnosis not present

## 2016-02-27 DIAGNOSIS — I10 Essential (primary) hypertension: Secondary | ICD-10-CM | POA: Diagnosis present

## 2016-02-27 MED ORDER — GI COCKTAIL ~~LOC~~
ORAL | Status: AC
  Filled 2016-02-27: qty 30

## 2016-02-27 MED ORDER — GI COCKTAIL ~~LOC~~
30.0000 mL | Freq: Once | ORAL | Status: AC
  Administered 2016-02-27: 30 mL via ORAL

## 2016-02-27 MED ORDER — ONDANSETRON 4 MG PO TBDP
4.0000 mg | ORAL_TABLET | Freq: Once | ORAL | Status: AC
  Administered 2016-02-27: 4 mg via ORAL

## 2016-02-27 MED ORDER — ONDANSETRON 4 MG PO TBDP
ORAL_TABLET | ORAL | Status: AC
  Filled 2016-02-27: qty 1

## 2016-02-27 MED ORDER — CHLORTHALIDONE 25 MG PO TABS: 25.0000 mg | ORAL_TABLET | Freq: Every day | ORAL | 1 refills | Status: DC

## 2016-02-27 MED ORDER — ONDANSETRON 4 MG PO TBDP: 4.0000 mg | ORAL_TABLET | Freq: Three times a day (TID) | ORAL | 0 refills | Status: DC | PRN

## 2016-02-27 MED ORDER — OMEPRAZOLE 40 MG PO CPDR: 40.0000 mg | DELAYED_RELEASE_CAPSULE | Freq: Every day | ORAL | 0 refills | Status: DC

## 2016-02-27 NOTE — ED Provider Notes (Signed)
CSN: [id number]     Arrival date & time 02/27/16  1644 History   First MD Initiated Contact with Patient 02/27/16 1738     Chief Complaint  Patient presents with  . Abdominal Cramping   (Consider location/radiation/quality/duration/timing/severity/associated sxs/prior Treatment) 61 year old male patient presents to clinic with a 3 to four-day history of abdominal cramping, along with nausea. Reports he's been vomiting anywhere from 4-5 times a day, unable to hold food down. Last meal he tried was yesterday, reported to the fillet fish sandwich from [employer]. He does have prior history of pancreatitis 2-3 years ago was hospitalized for that he is presenting with hypertension, he does have past history of hypertension. He does still have both his gallbladder, and his appendix. Denies any diarrhea denies any fever, weakness, or other symptoms.   The history is provided by the patient.  Abdominal Cramping     Past Medical History:  Diagnosis Date  . Back pain    Past Surgical History:  Procedure Laterality Date  . MULTIPLE TOOTH EXTRACTIONS    . ROBOT ASSISTED LAPAROSCOPIC RADICAL PROSTATECTOMY  12/13/2010   Procedure: ROBOTIC ASSISTED LAPAROSCOPIC RADICAL PROSTATECTOMY;  Surgeon: Bernestine Amass, MD;  Location: [facility];  Service: Urology;  Laterality: N/A;  with Bilateral Pelivic Lymph Node Dissection   History reviewed. No pertinent family history. Social History  Substance Use Topics  . Smoking status: Current Every Day Smoker    Packs/day: 0.50    Years: 25.00    Types: Cigarettes  . Smokeless tobacco: Never Used  . Alcohol use 0.0 oz/week     Comment: occasional    Review of Systems  Reason unable to perform ROS: as covered in HPI.  All other systems reviewed and are negative.   Allergies  Patient has no known allergies.  Home Medications   Prior to Admission medications   Medication Sig Start Date End Date Taking? Authorizing Provider  omeprazole (PRILOSEC) 40 MG  capsule Take 1 capsule (40 mg total) by mouth daily. 02/27/16   Barnet Glasgow, NP  ondansetron (ZOFRAN ODT) 4 MG disintegrating tablet Take 1 tablet (4 mg total) by mouth every 8 (eight) hours as needed for nausea or vomiting. 02/27/16   Barnet Glasgow, NP   Meds Ordered and Administered this Visit   Medications  gi cocktail (Maalox,Lidocaine,Donnatal) (30 mLs Oral Given 02/27/16 1756)  ondansetron (ZOFRAN-ODT) disintegrating tablet 4 mg (4 mg Oral Given 02/27/16 1754)    BP 192/95 (BP Location: Right Arm)   Pulse 74   Temp 98.5 F (36.9 C) (Oral)   Resp 17   SpO2 100%  No data found.   Physical Exam  Constitutional: He is oriented to person, place, and time. He appears well-developed and well-nourished. He appears distressed.  HENT:  Head: Normocephalic and atraumatic.  Right Ear: External ear normal.  Left Ear: External ear normal.  Neck: Normal range of motion. Neck supple.  Cardiovascular: Normal rate and regular rhythm.   Pulmonary/Chest: Effort normal and breath sounds normal.  Abdominal: Soft. Bowel sounds are normal. There is tenderness in the epigastric area. There is no rigidity, no guarding and no CVA tenderness. A hernia is present. Hernia confirmed positive in the ventral area.  Neurological: He is alert and oriented to person, place, and time.  Skin: Skin is warm and dry. Capillary refill takes less than 2 seconds. He is not diaphoretic.  Nursing note and vitals reviewed.   Urgent Care Course     ED EKG Date/Time: 02/27/2016 7:31 PM  Performed by: Barnet Glasgow Authorized by: Barnet Glasgow   ECG reviewed by ED Physician in the absence of a cardiologist: no   Previous ECG:    Previous ECG:  Unavailable Interpretation:    Interpretation: normal   Rate:    ECG rate:  74   ECG rate assessment: normal   Rhythm:    Rhythm: sinus rhythm   Ectopy:    Ectopy: none   QRS:    QRS axis:  Normal   QRS intervals:  Normal Conduction:    Conduction:  normal   ST segments:    ST segments:  Normal T waves:    T waves: normal   Comments:     Ventricular rate 74 PR interval 142 ms QRS 82 ms QT/QTc 446/495 ms   (including critical care time)  Labs Review Labs Reviewed - No data to display  Imaging Review No results found.   Visual Acuity Review  Right Eye Distance:   Left Eye Distance:   Bilateral Distance:    Right Eye Near:   Left Eye Near:    Bilateral Near:         MDM   1. Hernia of abdominal wall   2. Abdominal cramping   3. Essential hypertension    For your hypertension, I recommend you follow up with a primary care provider who can manager your blood pressure and do the necessary lab work to monitor your condition. Avoid salt when ever possible, drink plenty of water, and follow up with primary care.  For your abdominal pain, I recommend you eat a clear liquid diet for next 24-72 hours, avoiding solid food till your stomach has time to heal. I would also recommend avoiding alcohol as well as this will make your condition worse. I have started Omeprazole, 40 mg, take 1 tablet daily. For Nausea, I have prescribed Zofran, take 1 tablet under the tongue every 8 hours as needed.   For your hernia, if you are having pain, or other complaints, I recommend you follow up with a general surgeon for the management of your condition.   Should your symptoms return, or if they worsen, I recommend you follow up at the emergency room.      Barnet Glasgow, NP 02/27/16 [phone]

## 2016-02-27 NOTE — Discharge Instructions (Signed)
For your hypertension, I recommend you follow up with a primary care provider who can manager your blood pressure and do the necessary lab work to monitor your condition. Avoid salt when ever possible, drink plenty of water, and follow up with primary care.  For your abdominal pain, I recommend you eat a clear liquid diet for next 24-72 hours, avoiding solid food till your stomach has time to heal. I would also recommend avoiding alcohol as well as this will make your condition worse. I have started Omeprazole, 40 mg, take 1 tablet daily. For Nausea, I have prescribed Zofran, take 1 tablet under the tongue every 8 hours as needed.   For your hernia, if you are having pain, or other complaints, I recommend you follow up with a general surgeon for the management of your condition.   Should your symptoms return, or if they worsen, I recommend you follow up at the emergency room.

## 2016-02-27 NOTE — ED Triage Notes (Signed)
The patient presented to the [facility] with a complaint of abdominal pain with N/V that started this am.

## 2016-02-27 NOTE — ED Notes (Signed)
Bed: UC05 Expected date:  Expected time:  Means of arrival:  Comments: Hold for triage

## 2016-04-04 ENCOUNTER — Telehealth: Payer: Self-pay | Admitting: Family Medicine

## 2016-04-04 NOTE — Telephone Encounter (Signed)
Pt would like to know If you would take him on as a pt.  His wife is your patient

## 2016-04-09 NOTE — Telephone Encounter (Signed)
Yes, I will see him.

## 2016-04-26 ENCOUNTER — Ambulatory Visit (INDEPENDENT_AMBULATORY_CARE_PROVIDER_SITE_OTHER): Payer: Medicare Other | Admitting: Internal Medicine

## 2016-04-26 ENCOUNTER — Other Ambulatory Visit (INDEPENDENT_AMBULATORY_CARE_PROVIDER_SITE_OTHER): Payer: Medicare Other

## 2016-04-26 ENCOUNTER — Telehealth: Payer: Self-pay

## 2016-04-26 ENCOUNTER — Encounter: Payer: Self-pay | Admitting: Internal Medicine

## 2016-04-26 VITALS — BP 120/68 | HR 66 | Temp 97.7°F | Resp 16 | Ht 68.0 in | Wt 142.0 lb

## 2016-04-26 DIAGNOSIS — D539 Nutritional anemia, unspecified: Secondary | ICD-10-CM | POA: Insufficient documentation

## 2016-04-26 DIAGNOSIS — Z720 Tobacco use: Secondary | ICD-10-CM | POA: Diagnosis not present

## 2016-04-26 DIAGNOSIS — E785 Hyperlipidemia, unspecified: Secondary | ICD-10-CM | POA: Diagnosis not present

## 2016-04-26 DIAGNOSIS — Z23 Encounter for immunization: Secondary | ICD-10-CM

## 2016-04-26 DIAGNOSIS — R7989 Other specified abnormal findings of blood chemistry: Secondary | ICD-10-CM

## 2016-04-26 DIAGNOSIS — F1010 Alcohol abuse, uncomplicated: Secondary | ICD-10-CM | POA: Insufficient documentation

## 2016-04-26 DIAGNOSIS — R945 Abnormal results of liver function studies: Secondary | ICD-10-CM

## 2016-04-26 DIAGNOSIS — I10 Essential (primary) hypertension: Secondary | ICD-10-CM

## 2016-04-26 DIAGNOSIS — C61 Malignant neoplasm of prostate: Secondary | ICD-10-CM

## 2016-04-26 DIAGNOSIS — N5201 Erectile dysfunction due to arterial insufficiency: Secondary | ICD-10-CM | POA: Diagnosis not present

## 2016-04-26 DIAGNOSIS — F1090 Alcohol use, unspecified, uncomplicated: Secondary | ICD-10-CM | POA: Insufficient documentation

## 2016-04-26 LAB — CBC WITH DIFFERENTIAL/PLATELET
BASOS PCT: 0.4 % (ref 0.0–3.0)
Basophils Absolute: 0 10*3/uL (ref 0.0–0.1)
Eosinophils Absolute: 0.1 10*3/uL (ref 0.0–0.7)
Eosinophils Relative: 1.6 % (ref 0.0–5.0)
HCT: 43.9 % (ref 39.0–52.0)
Hemoglobin: 15.1 g/dL (ref 13.0–17.0)
Lymphocytes Relative: 30.3 % (ref 12.0–46.0)
Lymphs Abs: 1.3 10*3/uL (ref 0.7–4.0)
MCHC: 34.3 g/dL (ref 30.0–36.0)
MCV: 102.4 fl — AB (ref 78.0–100.0)
MONO ABS: 0.6 10*3/uL (ref 0.1–1.0)
MONOS PCT: 14 % — AB (ref 3.0–12.0)
NEUTROS ABS: 2.4 10*3/uL (ref 1.4–7.7)
NEUTROS PCT: 53.7 % (ref 43.0–77.0)
PLATELETS: 142 10*3/uL — AB (ref 150.0–400.0)
RBC: 4.29 Mil/uL (ref 4.22–5.81)
RDW: 15.3 % (ref 11.5–15.5)
WBC: 4.4 10*3/uL (ref 4.0–10.5)

## 2016-04-26 LAB — URINALYSIS, ROUTINE W REFLEX MICROSCOPIC
Bilirubin Urine: NEGATIVE
HGB URINE DIPSTICK: NEGATIVE
Ketones, ur: NEGATIVE
Leukocytes, UA: NEGATIVE
NITRITE: NEGATIVE
PH: 5.5 (ref 5.0–8.0)
SPECIFIC GRAVITY, URINE: 1.015 (ref 1.000–1.030)
TOTAL PROTEIN, URINE-UPE24: NEGATIVE
URINE GLUCOSE: NEGATIVE
Urobilinogen, UA: 0.2 (ref 0.0–1.0)

## 2016-04-26 LAB — COMPREHENSIVE METABOLIC PANEL
ALT: 11 U/L (ref 0–53)
AST: 28 U/L (ref 0–37)
Albumin: 4.6 g/dL (ref 3.5–5.2)
Alkaline Phosphatase: 83 U/L (ref 39–117)
BUN: 12 mg/dL (ref 6–23)
CO2: 27 meq/L (ref 19–32)
Calcium: 9.7 mg/dL (ref 8.4–10.5)
Chloride: 99 mEq/L (ref 96–112)
Creatinine, Ser: 1.21 mg/dL (ref 0.40–1.50)
GFR: 77.91 mL/min (ref >60.00)
GLUCOSE: 86 mg/dL (ref 70–99)
Potassium: 3.7 mEq/L (ref 3.5–5.1)
SODIUM: 138 meq/L (ref 135–145)
TOTAL PROTEIN: 9.1 g/dL — AB (ref 6.0–8.3)
Total Bilirubin: 0.7 mg/dL (ref 0.2–1.2)

## 2016-04-26 LAB — HEPATITIS A ANTIBODY, TOTAL: Hep A Total Ab: REACTIVE — AB

## 2016-04-26 LAB — LIPID PANEL
CHOL/HDL RATIO: 3
Cholesterol: 219 mg/dL — ABNORMAL HIGH (ref 0–200)
HDL: 81.9 mg/dL (ref >39.00)
LDL Cholesterol: 115 mg/dL — ABNORMAL HIGH (ref 0–99)
NONHDL: 137.1
Triglycerides: 112 mg/dL (ref 0.0–149.0)
VLDL: 22.4 mg/dL (ref 0.0–40.0)

## 2016-04-26 LAB — FOLATE: Folate: 10 ng/mL (ref >5.9)

## 2016-04-26 LAB — IBC PANEL
Iron: 138 ug/dL (ref 42–165)
SATURATION RATIOS: 32.7 % (ref 20.0–50.0)
TRANSFERRIN: 301 mg/dL (ref 212.0–360.0)

## 2016-04-26 LAB — FERRITIN: Ferritin: 238.1 ng/mL (ref 22.0–322.0)

## 2016-04-26 LAB — PSA

## 2016-04-26 LAB — VITAMIN B12: Vitamin B-12: 248 pg/mL (ref 211–911)

## 2016-04-26 LAB — HEPATITIS C ANTIBODY: HCV AB: NEGATIVE

## 2016-04-26 LAB — TSH: TSH: 1.72 u[IU]/mL (ref 0.35–4.50)

## 2016-04-26 LAB — HEPATITIS B SURFACE ANTIBODY,QUALITATIVE: HEP B S AB: NEGATIVE

## 2016-04-26 LAB — HEPATITIS B CORE ANTIBODY, TOTAL: HEP B C TOTAL AB: NONREACTIVE

## 2016-04-26 MED ORDER — AVANAFIL 100 MG PO TABS: 1.0000 | ORAL_TABLET | ORAL | 11 refills | Status: DC | PRN

## 2016-04-26 NOTE — Progress Notes (Signed)
Pre visit review using our clinic review tool, if applicable. No additional management support is needed unless otherwise documented below in the visit note. 

## 2016-04-26 NOTE — Patient Instructions (Signed)

## 2016-04-26 NOTE — Telephone Encounter (Signed)
Order [id number]

## 2016-04-26 NOTE — Progress Notes (Signed)
Subjective:  Patient ID: Scott Lindsey, male    DOB: 11/09/1955  Age: 61 y.o. MRN: [id number]  CC: Hypertension and Anemia  NEW TO ME  HPI [name] presents for Establishing as a new patient. He was recently admitted for alcohol-related pancreatitis and hepatitis. He continues just drink vodka several times a day. He also has a history of prostate cancer, anemia and high blood pressure. He tells me he has never been screened for colon cancer. He also has a history of elevated LFTs but it looks like he's never been screened for viral hepatitis. Despite all this, he feels well today and offers no specific complaints.  History Denham has a past medical history of Back pain.   He has a past surgical history that includes Multiple tooth extractions and Robot assisted laparoscopic radical prostatectomy (12/13/2010).   His family history includes Prostate cancer in his brother.He reports that he has been smoking Cigarettes.  He has a 12.50 pack-year smoking history. He has never used smokeless tobacco. He reports that he drinks about 9.0 oz of alcohol per week . He reports that he does not use drugs.  Outpatient Medications Prior to Visit  Medication Sig Dispense Refill  . omeprazole (PRILOSEC) 40 MG capsule Take 1 capsule (40 mg total) by mouth daily. 14 capsule 0  . ondansetron (ZOFRAN ODT) 4 MG disintegrating tablet Take 1 tablet (4 mg total) by mouth every 8 (eight) hours as needed for nausea or vomiting. (Patient not taking: Reported on 04/26/2016) 20 tablet 0   No facility-administered medications prior to visit.     ROS Review of Systems  Constitutional: Negative.  Negative for activity change, appetite change, diaphoresis, fatigue and unexpected weight change.  HENT: Negative.  Negative for facial swelling and trouble swallowing.   Eyes: Negative.   Respiratory: Negative.  Negative for cough, shortness of breath and wheezing.   Cardiovascular: Negative for chest pain,  palpitations and leg swelling.  Gastrointestinal: Negative.  Negative for abdominal pain, constipation, diarrhea, nausea and vomiting.  Endocrine: Negative.   Genitourinary: Negative.  Negative for decreased urine volume, difficulty urinating and dysuria.       +ED, he wants to try medication for erectile dysfunction  Musculoskeletal: Negative.  Negative for back pain and myalgias.  Skin: Negative.  Negative for color change and rash.  Allergic/Immunologic: Negative.   Neurological: Negative.  Negative for dizziness, weakness, light-headedness and numbness.  Hematological: Negative for adenopathy. Does not bruise/bleed easily.  Psychiatric/Behavioral: Negative.  Negative for decreased concentration, dysphoric mood and sleep disturbance. The patient is not nervous/anxious.     Objective:  BP 120/68 (BP Location: Left Arm, Patient Position: Sitting, Cuff Size: Normal)   Pulse 66   Temp 97.7 F (36.5 C) (Oral)   Resp 16   Ht 5\' 8"  (1.727 m)   Wt 142 lb (64.4 kg)   SpO2 98% Comment: Could not obtain  BMI 21.59 kg/m   Physical Exam  Constitutional: He is oriented to person, place, and time. No distress.  HENT:  Mouth/Throat: Oropharynx is clear and moist. No oropharyngeal exudate.  Eyes: Conjunctivae are normal. Right eye exhibits no discharge. Left eye exhibits no discharge. No scleral icterus.  Neck: Normal range of motion. Neck supple. No JVD present. No tracheal deviation present. No thyromegaly present.  Cardiovascular: Normal rate, regular rhythm, normal heart sounds and intact distal pulses.  Exam reveals no gallop and no friction rub.   No murmur heard. Pulmonary/Chest: Effort normal. No accessory muscle  usage or stridor. No tachypnea. No respiratory distress. He has no decreased breath sounds. He has no wheezes. He has no rhonchi. He has rales in the right lower field. He exhibits no tenderness.  Abdominal: Soft. Bowel sounds are normal. He exhibits no distension and no mass.  There is no tenderness. There is no rebound and no guarding.  Musculoskeletal: He exhibits no edema, tenderness or deformity.  Lymphadenopathy:    He has no cervical adenopathy.  Neurological: He is oriented to person, place, and time.  Skin: Skin is warm and dry. No rash noted. He is not diaphoretic. No erythema. No pallor.  Vitals reviewed.  Lab Results  Component Value Date   WBC 4.4 04/26/2016   HGB 15.1 04/26/2016   HCT 43.9 04/26/2016   PLT 142.0 (L) 04/26/2016   GLUCOSE 86 04/26/2016   CHOL 219 (H) 04/26/2016   TRIG 112.0 04/26/2016   HDL 81.90 04/26/2016   LDLCALC 115 (H) 04/26/2016   ALT 11 04/26/2016   AST 28 04/26/2016   NA 138 04/26/2016   K 3.7 04/26/2016   CL 99 04/26/2016   CREATININE 1.21 04/26/2016   BUN 12 04/26/2016   CO2 27 04/26/2016   TSH 1.72 04/26/2016   PSA 0.00 Repeated and verified X2. (L) 04/26/2016    Assessment & Plan:   Louie was seen today for hypertension and anemia.  Diagnoses and all orders for this visit:  Tobacco abuse disorder- will screen for lung cancer -     Ambulatory Referral for Lung Cancer Scre  Deficiency anemia- his H/H are normal now, will screen for Vit deficiencies, will screen for colon ca/polyps with a cologuard test -     CBC with Differential/Platelet; Future -     IBC panel; Future -     Vitamin B12; Future -     Folate; Future -     Ferritin; Future -     Vitamin B1; Future  Prostate cancer ([place])- his PSA is undetectable, no evidence of recurrence -     PSA; Future  Hyperlipidemia LDL goal <100- his Framingham risk score is 8% but he is not willing to take a statin for cardiovascular risk reduction. -     Lipid panel; Future -     TSH; Future  Erectile dysfunction due to arterial insufficiency -     Avanafil (STENDRA) 100 MG TABS; Take 1 tablet by mouth every other day as needed.  Alcohol abuse  Elevated LFTs- his LFTs are normal now, this was most likely related to alcohol abuse, he has positive  antibodies for hepatitis A but no antibodies for hepatitis B so I will ask him to get vaccinated against hepatitis B, his screening for hepatitis C is negative -     Comprehensive metabolic panel; Future -     Hepatitis A antibody, total; Future -     Hepatitis B core antibody, total; Future -     Hepatitis B surface antibody; Future -     Hepatitis C antibody; Future  Essential hypertension- his blood pressure is well-controlled on no medications. -     Urinalysis, Routine w reflex microscopic; Future  Need for vaccination against Streptococcus pneumoniae using pneumococcal conjugate vaccine 13 -     Pneumococcal conjugate vaccine 13-valent   I have discontinued Mr. Leedy ondansetron. I am also having him start on Avanafil. Additionally, I am having him maintain his omeprazole.  Meds ordered this encounter  Medications  . Avanafil (STENDRA) 100 MG TABS  Sig: Take 1 tablet by mouth every other day as needed.    Dispense:  10 tablet    Refill:  11     Follow-up: Return in about 2 months (around 06/26/2016).  Scarlette Calico, MD

## 2016-05-01 ENCOUNTER — Other Ambulatory Visit: Payer: Self-pay | Admitting: Internal Medicine

## 2016-05-01 DIAGNOSIS — E519 Thiamine deficiency, unspecified: Secondary | ICD-10-CM

## 2016-05-01 DIAGNOSIS — Z1212 Encounter for screening for malignant neoplasm of rectum: Secondary | ICD-10-CM | POA: Diagnosis not present

## 2016-05-01 DIAGNOSIS — Z1211 Encounter for screening for malignant neoplasm of colon: Secondary | ICD-10-CM | POA: Diagnosis not present

## 2016-05-01 LAB — VITAMIN B1: Vitamin B1 (Thiamine): <7 nmol/L — ABNORMAL LOW (ref 8–30)

## 2016-05-01 MED ORDER — VITAMIN B-1 250 MG PO TABS: 250.0000 mg | ORAL_TABLET | Freq: Every day | ORAL | 1 refills | Status: DC

## 2016-05-03 ENCOUNTER — Telehealth: Payer: Self-pay | Admitting: Internal Medicine

## 2016-05-03 NOTE — Telephone Encounter (Signed)
Patient has called back.  Gave him MD response on labs.  Patient will pick up Vit B1.  He wants to wait until his June appt to start Hep B.  Made a note on that OV.  If patient needs to know about anything else please follow up in regard.

## 2016-05-03 NOTE — Telephone Encounter (Signed)
Reviewed below and noted.  Forwarding to PCP as FYI.

## 2016-05-09 LAB — COLOGUARD: Cologuard: NEGATIVE

## 2016-05-17 ENCOUNTER — Encounter: Payer: Self-pay | Admitting: Internal Medicine

## 2016-05-17 NOTE — Telephone Encounter (Signed)
Negative result abstracted

## 2016-06-26 ENCOUNTER — Other Ambulatory Visit: Payer: Medicare Other

## 2016-06-26 ENCOUNTER — Ambulatory Visit (INDEPENDENT_AMBULATORY_CARE_PROVIDER_SITE_OTHER): Payer: Medicare Other | Admitting: Internal Medicine

## 2016-06-26 ENCOUNTER — Encounter: Payer: Self-pay | Admitting: Internal Medicine

## 2016-06-26 VITALS — BP 126/82 | HR 72 | Temp 98.1°F | Resp 16 | Ht 68.0 in | Wt 142.0 lb

## 2016-06-26 DIAGNOSIS — E519 Thiamine deficiency, unspecified: Secondary | ICD-10-CM

## 2016-06-26 DIAGNOSIS — N5201 Erectile dysfunction due to arterial insufficiency: Secondary | ICD-10-CM | POA: Diagnosis not present

## 2016-06-26 DIAGNOSIS — Z23 Encounter for immunization: Secondary | ICD-10-CM

## 2016-06-26 NOTE — Patient Instructions (Signed)
Thiamine, Vitamin B1 tablets What is this medicine? THIAMINE (THAHY uh min) is a vitamin B1. It is added to a healthy diet to prevent or to treat low vitamin B1 levels. This vitamin may be used for other purposes; ask your health care provider or pharmacist if you have questions. This medicine may be used for other purposes; ask your health care provider or pharmacist if you have questions. What should I tell my health care provider before I take this medicine? They need to know if you have any of the following conditions: -Wernicke's disease -an unusual or allergic reaction to B vitamins, other medicines, foods, dyes, or preservatives -pregnant or trying to get pregnant -breast-feeding How should I use this medicine? Take this medicine by mouth with a glass of water. Follow the directions on the package or prescription label. For best results take this medicine with food. Take your medicine at regular intervals. Do not take your medicine more often than directed. Talk to your pediatrician regarding the use of this medicine in children. While this medicine may be prescribed for selected conditions, precautions do apply. Overdosage: If you think you have taken too much of this medicine contact a poison control center or emergency room at once. NOTE: This medicine is only for you. Do not share this medicine with others. What if I miss a dose? If you miss a dose, take it as soon as you can. If it is almost time for your next dose, take only that dose. Do not take double or extra doses. What may interact with this medicine? Interactions are not expected. This list may not describe all possible interactions. Give your health care provider a list of all the medicines, herbs, non-prescription drugs, or dietary supplements you use. Also tell them if you smoke, drink alcohol, or use illegal drugs. Some items may interact with your medicine. What should I watch for while using this medicine? Follow a  healthy diet. Taking a vitamin supplement does not replace the need for a balanced diet. Some foods that have this vitamin naturally are yeast, beans, peas, nuts, pork, and beef. Limit alcohol, smoking and stress. Too much of this vitamin can be unsafe. Talk to your doctor or health care provider about how much is right for you. What side effects may I notice from receiving this medicine? Side effects that you should report to your doctor or health care professional as soon as possible: -allergic reactions like skin rash, itching or hives, swelling of the face, lips, or tongue -chest tightness -fast, irregular heartbeat -irritable, restless -nausea, vomiting -sweating -unusually bleeding or bruising Side effects that usually do not require medical attention (report to your doctor or health care professional if they continue or are bothersome): -sneezing This list may not describe all possible side effects. Call your doctor for medical advice about side effects. You may report side effects to FDA at 1-800-FDA-1088. Where should I keep my medicine? Keep out of the reach of children. Store at room temperature between 15 and 30 degrees C (59 and 85 degrees F). Protect from heat and light. Throw away any unused medicine after the expiration date. NOTE: This sheet is a summary. It may not cover all possible information. If you have questions about this medicine, talk to your doctor, pharmacist, or health care provider.  2018 Elsevier/Gold Standard (2007-09-11 12:50:29)

## 2016-06-26 NOTE — Progress Notes (Signed)
Subjective:  Patient ID: Scott Lindsey, male    DOB: 08-17-1955  Age: 61 y.o. MRN: [id number]  CC: Follow-up   HPI DONTARIOUS SCHAUM presents for f/up - After recent diagnosis of B1 deficiency. He tells me he is taking the B-1 supplement and other than erectile dysfunction he offers no new complaints today.  Outpatient Medications Prior to Visit  Medication Sig Dispense Refill  . omeprazole (PRILOSEC) 40 MG capsule Take 1 capsule (40 mg total) by mouth daily. 14 capsule 0  . Thiamine HCl (VITAMIN B-1) 250 MG tablet Take 1 tablet (250 mg total) by mouth daily. 90 tablet 1  . Avanafil (STENDRA) 100 MG TABS Take 1 tablet by mouth every other day as needed. 10 tablet 11   No facility-administered medications prior to visit.     ROS Review of Systems  Constitutional: Negative for fatigue.  HENT: Negative.  Negative for trouble swallowing.   Eyes: Negative for visual disturbance.  Respiratory: Negative for cough, chest tightness and shortness of breath.   Cardiovascular: Negative for chest pain, palpitations and leg swelling.  Gastrointestinal: Negative for abdominal pain, constipation, diarrhea, nausea and vomiting.  Endocrine: Negative.   Genitourinary: Negative.  Negative for difficulty urinating.  Musculoskeletal: Negative.  Negative for back pain and myalgias.  Skin: Negative.  Negative for color change and rash.  Allergic/Immunologic: Negative.   Neurological: Negative for dizziness and weakness.  Hematological: Negative.   Psychiatric/Behavioral: Negative.  Negative for decreased concentration and sleep disturbance. The patient is not nervous/anxious.   All other systems reviewed and are negative.   Objective:  BP 126/82 (BP Location: Left Arm, Patient Position: Sitting, Cuff Size: Normal)   Pulse 72   Temp 98.1 F (36.7 C) (Oral)   Resp 16   Ht 5\' 8"  (1.727 m)   Wt 142 lb (64.4 kg)   BMI 21.59 kg/m   BP Readings from Last 3 Encounters:  06/26/16 126/82  04/26/16  120/68  02/27/16 (!) 177/102    Wt Readings from Last 3 Encounters:  06/26/16 142 lb (64.4 kg)  04/26/16 142 lb (64.4 kg)  12/13/10 137 lb (62.1 kg)    Physical Exam  Constitutional: He is oriented to person, place, and time.  HENT:  Mouth/Throat: Oropharynx is clear and moist. No oropharyngeal exudate.  Eyes: Conjunctivae are normal. Right eye exhibits no discharge. Left eye exhibits no discharge. No scleral icterus.  Neck: Normal range of motion. Neck supple. No JVD present. No thyromegaly present.  Cardiovascular: Normal rate and intact distal pulses.  Exam reveals no gallop.   No murmur heard. Pulmonary/Chest: Effort normal and breath sounds normal. No respiratory distress. He has no wheezes. He has no rales.  Abdominal: Soft. Bowel sounds are normal. He exhibits no distension and no mass. There is no tenderness. There is no rebound and no guarding.  Musculoskeletal: Normal range of motion. He exhibits no edema or tenderness.  Lymphadenopathy:    He has no cervical adenopathy.  Neurological: He is alert and oriented to person, place, and time.  Skin: Skin is warm and dry. No rash noted. He is not diaphoretic. No erythema. No pallor.  Vitals reviewed.   Lab Results  Component Value Date   WBC 4.4 04/26/2016   HGB 15.1 04/26/2016   HCT 43.9 04/26/2016   PLT 142.0 (L) 04/26/2016   GLUCOSE 86 04/26/2016   CHOL 219 (H) 04/26/2016   TRIG 112.0 04/26/2016   HDL 81.90 04/26/2016   LDLCALC 115 (H) 04/26/2016  ALT 11 04/26/2016   AST 28 04/26/2016   NA 138 04/26/2016   K 3.7 04/26/2016   CL 99 04/26/2016   CREATININE 1.21 04/26/2016   BUN 12 04/26/2016   CO2 27 04/26/2016   TSH 1.72 04/26/2016   PSA 0.00 Repeated and verified X2. (L) 04/26/2016    No results found.  Assessment & Plan:   Eunice was seen today for follow-up.  Diagnoses and all orders for this visit:  Thiamine deficiency- I will recheck his B-1 level today to see that he has been adequately  treated -     Vitamin B1; Future  Need for hepatitis B vaccination- he agrees to start the Hep B vaccine series -     Hepatitis B vaccine adult IM  Erectile dysfunction due to arterial insufficiency- I have encouraged him to give [place] a try -     Avanafil (STENDRA) 100 MG TABS; Take 1 tablet by mouth every other day as needed.   I am having Mr. Mckeone maintain his omeprazole, vitamin B-1, and Avanafil.  Meds ordered this encounter  Medications  . Avanafil (STENDRA) 100 MG TABS    Sig: Take 1 tablet by mouth every other day as needed.    Dispense:  10 tablet    Refill:  11     Follow-up: Return in about 4 weeks (around 07/24/2016).  Scarlette Calico, MD

## 2016-06-27 MED ORDER — AVANAFIL 100 MG PO TABS: 1.0000 | ORAL_TABLET | ORAL | 11 refills | Status: DC | PRN

## 2016-06-30 ENCOUNTER — Encounter: Payer: Self-pay | Admitting: Internal Medicine

## 2016-06-30 ENCOUNTER — Other Ambulatory Visit: Payer: Self-pay | Admitting: Internal Medicine

## 2016-06-30 DIAGNOSIS — E519 Thiamine deficiency, unspecified: Secondary | ICD-10-CM

## 2016-06-30 LAB — VITAMIN B1: VITAMIN B1 (THIAMINE): 50 nmol/L — AB (ref 8–30)

## 2016-06-30 MED ORDER — VITAMIN B-1 50 MG PO TABS: 50.0000 mg | ORAL_TABLET | Freq: Every day | ORAL | 3 refills | Status: DC

## 2016-07-26 ENCOUNTER — Ambulatory Visit (INDEPENDENT_AMBULATORY_CARE_PROVIDER_SITE_OTHER): Payer: Medicare Other | Admitting: General Practice

## 2016-07-26 DIAGNOSIS — Z23 Encounter for immunization: Secondary | ICD-10-CM | POA: Diagnosis not present

## 2016-12-27 ENCOUNTER — Ambulatory Visit (INDEPENDENT_AMBULATORY_CARE_PROVIDER_SITE_OTHER): Payer: Medicare Other | Admitting: *Deleted

## 2016-12-27 ENCOUNTER — Encounter: Payer: Self-pay | Admitting: *Deleted

## 2016-12-27 ENCOUNTER — Ambulatory Visit (INDEPENDENT_AMBULATORY_CARE_PROVIDER_SITE_OTHER): Payer: Medicare Other | Admitting: Internal Medicine

## 2016-12-27 ENCOUNTER — Ambulatory Visit: Payer: Medicare Other

## 2016-12-27 DIAGNOSIS — Z23 Encounter for immunization: Secondary | ICD-10-CM | POA: Diagnosis not present

## 2017-01-02 NOTE — Progress Notes (Signed)
   Subjective:  Patient ID: Scott Lindsey, male    DOB: Nov 03, 1955  Age: 61 y.o. MRN: [id number]  CC: No chief complaint on file.    HPI JAIZON DEROOS presents for he was not seen  Outpatient Medications Prior to Visit  Medication Sig Dispense Refill  . Avanafil (STENDRA) 100 MG TABS Take 1 tablet by mouth every other day as needed. 10 tablet 11  . omeprazole (PRILOSEC) 40 MG capsule Take 1 capsule (40 mg total) by mouth daily. 14 capsule 0  . thiamine (VITAMIN B-1) 50 MG tablet Take 1 tablet (50 mg total) by mouth daily. 90 tablet 3   No facility-administered medications prior to visit.     ROS Review of Systems  Objective:  There were no vitals taken for this visit.  BP Readings from Last 3 Encounters:  06/26/16 126/82  04/26/16 120/68  02/27/16 (!) 177/102    Wt Readings from Last 3 Encounters:  06/26/16 142 lb (64.4 kg)  04/26/16 142 lb (64.4 kg)  12/13/10 137 lb (62.1 kg)    Physical Exam  Lab Results  Component Value Date   WBC 4.4 04/26/2016   HGB 15.1 04/26/2016   HCT 43.9 04/26/2016   PLT 142.0 (L) 04/26/2016   GLUCOSE 86 04/26/2016   CHOL 219 (H) 04/26/2016   TRIG 112.0 04/26/2016   HDL 81.90 04/26/2016   LDLCALC 115 (H) 04/26/2016   ALT 11 04/26/2016   AST 28 04/26/2016   NA 138 04/26/2016   K 3.7 04/26/2016   CL 99 04/26/2016   CREATININE 1.21 04/26/2016   BUN 12 04/26/2016   CO2 27 04/26/2016   TSH 1.72 04/26/2016   PSA 0.00 Repeated and verified X2. (L) 04/26/2016    No results found.  Assessment & Plan:   Diagnoses and all orders for this visit:  Erroneous encounter - disregard  Other orders -     Cancel: Hepatitis B vaccine adult IM   I am having Garen Lah. Stogdill maintain his omeprazole, Avanafil, and thiamine.  No orders of the defined types were placed in this encounter.    Follow-up: No Follow-up on file.  Scarlette Calico, MD

## 2017-08-01 ENCOUNTER — Other Ambulatory Visit: Payer: Self-pay | Admitting: Internal Medicine

## 2017-08-01 DIAGNOSIS — E519 Thiamine deficiency, unspecified: Secondary | ICD-10-CM

## 2017-08-28 ENCOUNTER — Emergency Department (HOSPITAL_COMMUNITY)
Admission: EM | Admit: 2017-08-28 | Discharge: 2017-08-29 | Payer: Medicare Other | Attending: Emergency Medicine | Admitting: Emergency Medicine

## 2017-08-28 ENCOUNTER — Other Ambulatory Visit: Payer: Self-pay

## 2017-08-28 ENCOUNTER — Encounter (HOSPITAL_COMMUNITY): Payer: Self-pay

## 2017-08-28 DIAGNOSIS — L03818 Cellulitis of other sites: Secondary | ICD-10-CM | POA: Insufficient documentation

## 2017-08-28 DIAGNOSIS — F17210 Nicotine dependence, cigarettes, uncomplicated: Secondary | ICD-10-CM | POA: Diagnosis not present

## 2017-08-28 DIAGNOSIS — F10239 Alcohol dependence with withdrawal, unspecified: Secondary | ICD-10-CM

## 2017-08-28 DIAGNOSIS — I10 Essential (primary) hypertension: Secondary | ICD-10-CM | POA: Diagnosis not present

## 2017-08-28 DIAGNOSIS — R0902 Hypoxemia: Secondary | ICD-10-CM | POA: Diagnosis not present

## 2017-08-28 DIAGNOSIS — T7840XA Allergy, unspecified, initial encounter: Secondary | ICD-10-CM | POA: Diagnosis not present

## 2017-08-28 DIAGNOSIS — L0390 Cellulitis, unspecified: Secondary | ICD-10-CM

## 2017-08-28 DIAGNOSIS — F10230 Alcohol dependence with withdrawal, uncomplicated: Secondary | ICD-10-CM | POA: Insufficient documentation

## 2017-08-28 DIAGNOSIS — L299 Pruritus, unspecified: Secondary | ICD-10-CM | POA: Diagnosis not present

## 2017-08-28 DIAGNOSIS — R000 Tachycardia, unspecified: Secondary | ICD-10-CM | POA: Diagnosis not present

## 2017-08-28 DIAGNOSIS — Z79899 Other long term (current) drug therapy: Secondary | ICD-10-CM | POA: Insufficient documentation

## 2017-08-28 DIAGNOSIS — F10939 Alcohol use, unspecified with withdrawal, unspecified: Secondary | ICD-10-CM

## 2017-08-28 LAB — CBC WITH DIFFERENTIAL/PLATELET
BASOS ABS: 0 10*3/uL (ref 0.0–0.1)
BASOS PCT: 0 %
EOS PCT: 2 %
Eosinophils Absolute: 0.2 10*3/uL (ref 0.0–0.7)
HCT: 37.3 % — ABNORMAL LOW (ref 39.0–52.0)
Hemoglobin: 13 g/dL (ref 13.0–17.0)
LYMPHS PCT: 5 %
Lymphs Abs: 0.7 10*3/uL (ref 0.7–4.0)
MCH: 35.1 pg — ABNORMAL HIGH (ref 26.0–34.0)
MCHC: 34.9 g/dL (ref 30.0–36.0)
MCV: 100.8 fL — AB (ref 78.0–100.0)
Monocytes Absolute: 0.4 10*3/uL (ref 0.1–1.0)
Monocytes Relative: 3 %
NEUTROS ABS: 12.1 10*3/uL — AB (ref 1.7–7.7)
Neutrophils Relative %: 90 %
Platelets: 93 10*3/uL — ABNORMAL LOW (ref 150–400)
RBC: 3.7 MIL/uL — AB (ref 4.22–5.81)
RDW: 14.5 % (ref 11.5–15.5)
WBC: 13.3 10*3/uL — AB (ref 4.0–10.5)

## 2017-08-28 LAB — BASIC METABOLIC PANEL
ANION GAP: 18 — AB (ref 5–15)
BUN: 15 mg/dL (ref 8–23)
CO2: 19 mmol/L — ABNORMAL LOW (ref 22–32)
Calcium: 8.6 mg/dL — ABNORMAL LOW (ref 8.9–10.3)
Chloride: 96 mmol/L — ABNORMAL LOW (ref 98–111)
Creatinine, Ser: 1.13 mg/dL (ref 0.61–1.24)
GFR calc Af Amer: >60 mL/min (ref >60)
Glucose, Bld: 97 mg/dL (ref 70–99)
Potassium: 3.9 mmol/L (ref 3.5–5.1)
SODIUM: 133 mmol/L — AB (ref 135–145)

## 2017-08-28 LAB — I-STAT CG4 LACTIC ACID, ED: Lactic Acid, Venous: 4.13 mmol/L (ref 0.5–1.9)

## 2017-08-28 MED ORDER — PIPERACILLIN-TAZOBACTAM 3.375 G IVPB 30 MIN
3.3750 g | INTRAVENOUS | Status: AC
  Administered 2017-08-28: 3.375 g via INTRAVENOUS
  Filled 2017-08-28: qty 50

## 2017-08-28 MED ORDER — THIAMINE HCL 100 MG/ML IJ SOLN
100.0000 mg | Freq: Every day | INTRAMUSCULAR | Status: DC
  Administered 2017-08-28: 100 mg via INTRAVENOUS
  Filled 2017-08-28: qty 2

## 2017-08-28 MED ORDER — LORAZEPAM 1 MG PO TABS: 0.0000 mg | ORAL_TABLET | Freq: Two times a day (BID) | ORAL | Status: DC

## 2017-08-28 MED ORDER — METHYLPREDNISOLONE SODIUM SUCC 125 MG IJ SOLR
125.0000 mg | Freq: Once | INTRAMUSCULAR | Status: AC
  Administered 2017-08-28: 125 mg via INTRAVENOUS
  Filled 2017-08-28: qty 2

## 2017-08-28 MED ORDER — LORAZEPAM 2 MG/ML IJ SOLN
0.0000 mg | Freq: Four times a day (QID) | INTRAMUSCULAR | Status: DC
  Administered 2017-08-28: 1 mg via INTRAVENOUS
  Filled 2017-08-28 (×2): qty 1

## 2017-08-28 MED ORDER — LORAZEPAM 2 MG/ML IJ SOLN: 0.0000 mg | Freq: Two times a day (BID) | INTRAMUSCULAR | Status: DC

## 2017-08-28 MED ORDER — VITAMIN B-1 100 MG PO TABS: 100.0000 mg | ORAL_TABLET | Freq: Every day | ORAL | Status: DC

## 2017-08-28 MED ORDER — FAMOTIDINE IN NACL 20-0.9 MG/50ML-% IV SOLN
20.0000 mg | Freq: Once | INTRAVENOUS | Status: AC
  Administered 2017-08-28: 20 mg via INTRAVENOUS
  Filled 2017-08-28: qty 50

## 2017-08-28 MED ORDER — SODIUM CHLORIDE 0.9 % IV BOLUS
1000.0000 mL | Freq: Once | INTRAVENOUS | Status: AC
  Administered 2017-08-28: 1000 mL via INTRAVENOUS

## 2017-08-28 MED ORDER — LORAZEPAM 1 MG PO TABS: 0.0000 mg | ORAL_TABLET | Freq: Four times a day (QID) | ORAL | Status: DC

## 2017-08-28 MED ORDER — VANCOMYCIN HCL 10 G IV SOLR
1500.0000 mg | INTRAVENOUS | Status: AC
  Administered 2017-08-28: 1500 mg via INTRAVENOUS
  Filled 2017-08-28: qty 1500

## 2017-08-28 NOTE — ED Provider Notes (Signed)
[facility] DEPT Provider Note   CSN: [id number] Arrival date & time: 08/28/17  1017     History   Chief Complaint Chief Complaint  Patient presents with  . Allergic Reaction    HPI Scott Lindsey is a 62 y.o. male with a past medical history of alcohol abuse (consisting of 2 beers daily +/- "some vodka"), hypertension, hyperlipidemia, prostate cancer who presents to ED for evaluation of allergic reaction.  She states that 2 days ago, he began having urticaria of bilateral upper extremities and torso.  His wife picked him up some over-the-counter "itching cream" to help with his symptoms.  He woke up the next morning after using this and found that his hands were swollen and felt tight.  He had taken 2 doses of Benadryl with only mild improvement in his symptoms.  States that the symptoms got worse today.  He denies any specific facial swelling, tongue swelling or trouble breathing.  However, his wife called EMS and they administered IM epinephrine and IV Benadryl.  He is unsure if this is related to some soup that he ate at a [employer] 2 days ago.  He has not eaten the soup before.  He cannot recall any other inciting event that may have triggered the symptoms.  Denies any prior history of similar symptoms.  He states that since arrival to the ED, he feels "a lot better."  He does still reports some swelling in his bilateral hands.  Denies any facial swelling, trouble breathing or trouble swallowing, fever, new environmental exposures. Patient denies any tick bites but does state that he works on his small garden outside.  HPI  Past Medical History:  Diagnosis Date  . Back pain     Patient Active Problem List   Diagnosis Date Noted  . Thiamine deficiency 05/01/2016  . Tobacco abuse disorder 04/26/2016  . Deficiency anemia 04/26/2016  . Hyperlipidemia LDL goal <100 04/26/2016  . Erectile dysfunction due to arterial insufficiency 04/26/2016  .  Alcohol abuse 04/26/2016  . Essential hypertension 02/27/2016  . Prostate cancer ([place]) 12/12/2010    Past Surgical History:  Procedure Laterality Date  . MULTIPLE TOOTH EXTRACTIONS    . ROBOT ASSISTED LAPAROSCOPIC RADICAL PROSTATECTOMY  12/13/2010   Procedure: ROBOTIC ASSISTED LAPAROSCOPIC RADICAL PROSTATECTOMY;  Surgeon: Bernestine Amass, MD;  Location: [facility];  Service: Urology;  Laterality: N/A;  with Bilateral Pelivic Lymph Node Dissection        Home Medications    Prior to Admission medications   Medication Sig Start Date End Date Taking? Authorizing Provider  diphenhydrAMINE (BENADRYL) 25 MG tablet Take 25 mg by mouth every 6 (six) hours as needed for itching or allergies.   Yes [provider]  Pramoxine-Camphor-Zinc Acetate (ANTI-ITCH CLEAR EX) Apply 1 application topically 2 (two) times daily as needed (itching).   Yes [provider]  thiamine (VITAMIN B-1) 50 MG tablet Take 1 tablet (50 mg total) by mouth daily. 06/30/16  Yes [name], MD  Avanafil (STENDRA) 100 MG TABS Take 1 tablet by mouth every other day as needed. Patient not taking: Reported on 08/28/2017 06/27/16   [name], MD  omeprazole (PRILOSEC) 40 MG capsule Take 1 capsule (40 mg total) by mouth daily. Patient not taking: Reported on 08/28/2017 02/27/16   Barnet Glasgow, NP    Family History Family History  Problem Relation Age of Onset  . Prostate cancer Brother     Social History Social History  Tobacco Use  . Smoking status: Current Every Day Smoker    Packs/day: 0.50    Years: 25.00    Pack years: 12.50    Types: Cigarettes  . Smokeless tobacco: Never Used  Substance Use Topics  . Alcohol use: Yes    Alcohol/week: 15.0 standard drinks    Types: 15 Shots of liquor per week    Comment: occasional  . Drug use: No     Allergies   Patient has no known allergies.   Review of Systems Review of Systems  Constitutional: Negative for appetite change, chills and  fever.  HENT: Negative for ear pain, rhinorrhea, sneezing and sore throat.   Eyes: Negative for photophobia and visual disturbance.  Respiratory: Negative for cough, chest tightness, shortness of breath and wheezing.   Cardiovascular: Negative for chest pain and palpitations.  Gastrointestinal: Negative for abdominal pain, blood in stool, constipation, diarrhea, nausea and vomiting.  Genitourinary: Negative for dysuria, hematuria and urgency.  Musculoskeletal: Negative for myalgias.  Skin: Positive for rash.  Neurological: Negative for dizziness, weakness and light-headedness.     Physical Exam Updated Vital Signs BP 140/85 (BP Location: Left Arm)   Pulse (!) 105   Temp 99.5 F (37.5 C) (Oral)   Resp (!) 23   Ht 5\' 8"  (1.727 m)   Wt 63.5 kg   SpO2 100%   BMI 21.29 kg/m   Physical Exam  Constitutional: He appears well-developed and well-nourished. No distress.  No signs of angioedema or anaphylaxis. Speaking in complete sentences without difficulty. Slightly tremulous/shivering.  HENT:  Head: Normocephalic and atraumatic.  Nose: Nose normal.  Eyes: Conjunctivae and EOM are normal. Left eye exhibits no discharge. No scleral icterus.  Neck: Normal range of motion. Neck supple.  Cardiovascular: Normal rate, regular rhythm, normal heart sounds and intact distal pulses. Exam reveals no gallop and no friction rub.  No murmur heard. Pulmonary/Chest: Effort normal and breath sounds normal. No respiratory distress.  Abdominal: Soft. Bowel sounds are normal. He exhibits no distension. There is no tenderness. There is no guarding.  Musculoskeletal: Normal range of motion. He exhibits no edema.  Neurological: He is alert. He exhibits normal muscle tone. Coordination normal.  Skin: Skin is warm and dry. Rash noted.  Edema of bilateral hands noted. Fine, erythematous, urticarial rash noted to bilateral upper extremities. Petechial rash noted to BLE. Skin warm to touch.  Psychiatric: He has  a normal mood and affect.  Nursing note and vitals reviewed.        ED Treatments / Results  Labs (all labs ordered are listed, but only abnormal results are displayed) Labs Reviewed  BASIC METABOLIC PANEL - Abnormal; Notable for the following components:      Result Value   Sodium 133 (*)    Chloride 96 (*)    CO2 19 (*)    Calcium 8.6 (*)    Anion gap 18 (*)    All other components within normal limits  CBC WITH DIFFERENTIAL/PLATELET - Abnormal; Notable for the following components:   WBC 13.3 (*)    RBC 3.70 (*)    HCT 37.3 (*)    MCV 100.8 (*)    MCH 35.1 (*)    Platelets 93 (*)    Neutro Abs 12.1 (*)    All other components within normal limits  I-STAT CG4 LACTIC ACID, ED - Abnormal; Notable for the following components:   Lactic Acid, Venous 4.13 (*)    All other components within normal limits  CULTURE, BLOOD (ROUTINE X 2)  CULTURE, BLOOD (ROUTINE X 2)  ETHANOL  HEPATIC FUNCTION PANEL  I-STAT CG4 LACTIC ACID, ED    EKG None  Radiology No results found.  Procedures Procedures (including critical care time)  CRITICAL CARE Performed by: Delia Heady   Total critical care time: 50 minutes  Critical care time was exclusive of separately billable procedures and treating other patients.  Critical care was necessary to treat or prevent imminent or life-threatening deterioration.  Critical care was time spent personally by me on the following activities: development of treatment plan with patient and/or surrogate as well as nursing, discussions with consultants, evaluation of patient's response to treatment, examination of patient, obtaining history from patient or surrogate, ordering and performing treatments and interventions, ordering and review of laboratory studies, ordering and review of radiographic studies, pulse oximetry and re-evaluation of patient's condition.   Medications Ordered in ED Medications  LORazepam (ATIVAN) injection 0-4 mg (1 mg  Intravenous Given 08/28/17 2222)    Or  LORazepam (ATIVAN) tablet 0-4 mg ( Oral See Alternative 08/28/17 2222)  LORazepam (ATIVAN) injection 0-4 mg (has no administration in time range)    Or  LORazepam (ATIVAN) tablet 0-4 mg (has no administration in time range)  thiamine (VITAMIN B-1) tablet 100 mg ( Oral See Alternative 08/28/17 2220)    Or  thiamine (B-1) injection 100 mg (100 mg Intravenous Given 08/28/17 2220)  vancomycin (VANCOCIN) 1,500 mg in sodium chloride 0.9 % 500 mL IVPB (1,500 mg Intravenous New Bag/Given 08/28/17 2241)  piperacillin-tazobactam (ZOSYN) IVPB 3.375 g (3.375 g Intravenous New Bag/Given 08/28/17 2344)  methylPREDNISolone sodium succinate (SOLU-MEDROL) 125 mg/2 mL injection 125 mg (125 mg Intravenous Given 08/28/17 2016)  famotidine (PEPCID) IVPB 20 mg premix (0 mg Intravenous Stopped 08/28/17 2158)  sodium chloride 0.9 % bolus 1,000 mL (0 mLs Intravenous Stopped 08/28/17 2345)     Initial Impression / Assessment and Plan / ED Course  I have reviewed the triage vital signs and the nursing notes.  Pertinent labs & imaging results that were available during my care of the patient were reviewed by me and considered in my medical decision making (see chart for details).  Clinical Course as of Aug 29 1  Wed Aug 28, 2017  2111 Patient reports ongoing improvement of symptoms. States he has felt better since arrival here. Will continue to monitor for rebound effects.   [HK]  2117 HR now elevated to 120-130. Patient appears to be withdrawing from ETOH. Will initiate CIWA protocol, give vancomycin for possible superimposed infection + reaction. Patient will need to be admitted for withdrawals and reaction. Patient is agreeable to admission.   [HK]  5427 Ferrel [place]ington, wife contact info: [phone]   [HK]  2307 Spoke to hospitalist.  He is requesting transfer to either [facility] or [facility] for patient to be evaluated by dermatology.  I spoke to the [place] who will call me  after speaking to transfer coordinator.   [HK]  [facility] ED states they do not have an in-house dermatologist that would be able to evaluate the patient. They recommending consulting ICU due to patient's high lactic acid, tachycardia and concern for potential SJS.   [HK]  2341 Awaiting call from [facility] ICU.   [HK]  2359 Spoke to Dr. Sabra Heck, ICU doctor at [facility].  He agrees to admit patient to ICU.  Will await orders.   [HK]    Clinical Course User Index [HK] Delia Heady, PA-C  62 year old male with a past medical history of alcohol abuse, hypertension, hyperlipidemia presents to ED for evaluation of allergic reaction.  States that he had urticaria of bilateral upper extremities and torso for the past 2 days.  He is unsure if this is related to some soup that he ate at a [employer].  He initially denied any specific facial swelling, tongue swelling or trouble breathing.  However, EMS states that he did have facial swelling which is what promoted them to give him IM epinephrine.  He states that he has had swelling of bilateral hands since waking up this morning.  He did use some type of anti-itch cream that his wife had bought from the store, all over my hands like it was lotion."  He also also developed blisters in the area.  He cannot recall any inciting event that may have triggered the symptoms with the exception of the soup.  He was also given IV Benadryl by EMS.  He states that since he is arrived in the ED, he is feeling a lot better.  There is still swelling of bilateral hands that is noticeable along with the blisters as noted in the image.  Patient has a petechial rash to bilateral lower extremities which wife states has also been there for the past 2 days.  However, both patient and wife are poor historians.  CBC shows leukocytosis at 13, likely reactive to inflammatory process, platelet count of 93.  BMP shows anion gap of 18.  Patient was given IV Pepcid and Solu-Medrol and  will be reassessed.  On reassessment, patient's heart rate elevated to 120-130.  Effects of epinephrine less likely to have caused this at this point.  He continues to have tremors.  Dr. Lita Mains is concerned that there may be a superimposed infection in addition to the reaction.  He now appears to be withdrawing from alcohol.  Will obtain blood cultures, lactic acid and give patient vancomycin.  Lactic acid elevated at 4.1.  Will order 1L fluids. He will need to be admitted for EtOH withdrawal and infection, can consider SJS.  Will admit to hospitalist.   Final Clinical Impressions(s) / ED Diagnoses   Final diagnoses:  Allergic reaction, initial encounter  Cellulitis, unspecified cellulitis site  Alcohol withdrawal syndrome with complication [facility])    ED Discharge Orders    None       Delia Heady, PA-C 08/29/17 0004    Julianne Rice, MD 09/02/17 [phone]

## 2017-08-28 NOTE — Progress Notes (Addendum)
A consult was received from an ED provider for Vancomycin and Zosyn per pharmacy dosing.  The patient's profile has been reviewed for ht/wt/allergies/indication/available labs.    A one time order has been placed for Vancomycin 1500mg  IV and Zosyn 3.375g IV (30 minute infusion).  Further antibiotics/pharmacy consults should be ordered by admitting physician if indicated.                       Thank you, Luiz Ochoa 08/28/2017  10:03 PM

## 2017-08-28 NOTE — ED Notes (Signed)
First set of Bld Cx drawn at 2148.  Second set of bld cx drawn at 2154

## 2017-08-28 NOTE — ED Notes (Signed)
Bed: JG85 Expected date:  Expected time:  Means of arrival:  Comments: EMS/allergic rxn

## 2017-08-28 NOTE — ED Notes (Signed)
ONE UNSUCCESSFUL LAB COLLECTION ATTEMPT 

## 2017-08-28 NOTE — ED Notes (Signed)
Called [facility] pals line for PA Alyssa @[phone] 

## 2017-08-28 NOTE — ED Triage Notes (Signed)
He c/o s/s of allergic reaction yesterday which involved urticaria and swelling of face and hand and feet which got better with Benadryl. Came back today "even worse". EMS gave IM Epi. And IV Benadryl. He also c/o some minimal abd. Discomfort and a few diarrhea stools today. He states this has never happened before, and that he is on no new or different med(s). He arives here telling us he feels "better" and is in no distress. Pt. At no time c/o swelling of mouth or throat.

## 2017-08-29 DIAGNOSIS — R4182 Altered mental status, unspecified: Secondary | ICD-10-CM | POA: Diagnosis not present

## 2017-08-29 DIAGNOSIS — Z789 Other specified health status: Secondary | ICD-10-CM | POA: Insufficient documentation

## 2017-08-29 DIAGNOSIS — R21 Rash and other nonspecific skin eruption: Secondary | ICD-10-CM | POA: Insufficient documentation

## 2017-08-29 DIAGNOSIS — T7840XA Allergy, unspecified, initial encounter: Secondary | ICD-10-CM | POA: Diagnosis not present

## 2017-08-29 LAB — HEPATIC FUNCTION PANEL
ALT: 17 U/L (ref 0–44)
AST: 31 U/L (ref 15–41)
Albumin: 3 g/dL — ABNORMAL LOW (ref 3.5–5.0)
Alkaline Phosphatase: 50 U/L (ref 38–126)
BILIRUBIN DIRECT: 0.4 mg/dL — AB (ref 0.0–0.2)
BILIRUBIN TOTAL: 1.4 mg/dL — AB (ref 0.3–1.2)
Indirect Bilirubin: 1 mg/dL — ABNORMAL HIGH (ref 0.3–0.9)
Total Protein: 6.6 g/dL (ref 6.5–8.1)

## 2017-08-29 LAB — I-STAT CG4 LACTIC ACID, ED: LACTIC ACID, VENOUS: 2.59 mmol/L — AB (ref 0.5–1.9)

## 2017-08-29 MED ORDER — LORAZEPAM 2 MG/ML IJ SOLN
2.0000 mg | Freq: Once | INTRAMUSCULAR | Status: AC
  Administered 2017-08-29: 2 mg via INTRAVENOUS

## 2017-08-30 DIAGNOSIS — R000 Tachycardia, unspecified: Secondary | ICD-10-CM | POA: Diagnosis not present

## 2017-08-30 DIAGNOSIS — R21 Rash and other nonspecific skin eruption: Secondary | ICD-10-CM | POA: Diagnosis not present

## 2017-09-02 DIAGNOSIS — R21 Rash and other nonspecific skin eruption: Secondary | ICD-10-CM | POA: Diagnosis not present

## 2017-09-02 DIAGNOSIS — R5381 Other malaise: Secondary | ICD-10-CM | POA: Insufficient documentation

## 2017-09-02 LAB — CULTURE, BLOOD (ROUTINE X 2)
Culture: NO GROWTH
Special Requests: ADEQUATE

## 2017-09-03 ENCOUNTER — Ambulatory Visit: Payer: Medicare Other | Admitting: Internal Medicine

## 2017-09-03 LAB — CULTURE, BLOOD (ROUTINE X 2)
CULTURE: NO GROWTH
SPECIAL REQUESTS: ADEQUATE

## 2017-09-13 DIAGNOSIS — M6281 Muscle weakness (generalized): Secondary | ICD-10-CM | POA: Diagnosis not present

## 2017-09-13 DIAGNOSIS — E876 Hypokalemia: Secondary | ICD-10-CM | POA: Diagnosis not present

## 2017-09-13 DIAGNOSIS — E871 Hypo-osmolality and hyponatremia: Secondary | ICD-10-CM | POA: Diagnosis not present

## 2017-09-13 DIAGNOSIS — L299 Pruritus, unspecified: Secondary | ICD-10-CM | POA: Diagnosis not present

## 2017-09-13 DIAGNOSIS — L239 Allergic contact dermatitis, unspecified cause: Secondary | ICD-10-CM | POA: Diagnosis not present

## 2017-09-17 DIAGNOSIS — E876 Hypokalemia: Secondary | ICD-10-CM | POA: Diagnosis not present

## 2017-09-17 DIAGNOSIS — E871 Hypo-osmolality and hyponatremia: Secondary | ICD-10-CM | POA: Diagnosis not present

## 2017-09-17 DIAGNOSIS — M6281 Muscle weakness (generalized): Secondary | ICD-10-CM | POA: Diagnosis not present

## 2017-09-17 DIAGNOSIS — L299 Pruritus, unspecified: Secondary | ICD-10-CM | POA: Diagnosis not present

## 2017-09-17 DIAGNOSIS — L239 Allergic contact dermatitis, unspecified cause: Secondary | ICD-10-CM | POA: Diagnosis not present

## 2017-09-20 ENCOUNTER — Telehealth: Payer: Self-pay | Admitting: Internal Medicine

## 2017-09-20 NOTE — Telephone Encounter (Signed)
[facility] contacted and verbal okay given as requested.

## 2017-09-20 NOTE — Telephone Encounter (Signed)
Copied from [place] [id number]. Topic: Quick Communication - See Telephone Encounter >> Sep 20, 2017  3:42 PM Synthia Innocent wrote: CRM for notification. See Telephone encounter for: 09/20/17. [facility] calling for verbal orders for nursing 1x a week for 5 weeks, also need PT and OT eval.

## 2017-09-23 ENCOUNTER — Telehealth: Payer: Self-pay | Admitting: Internal Medicine

## 2017-09-23 NOTE — Telephone Encounter (Signed)
Called and gave verbal okay for PT as requested.

## 2017-09-23 NOTE — Telephone Encounter (Signed)
Copied from [phone]. Topic: Quick Communication - See Telephone Encounter >> Sep 20, 2017  3:42 PM Synthia Innocent wrote: CRM for notification. See Telephone encounter for: 09/20/17. [facility] calling for verbal orders for nursing 1x a week for 5 weeks, also need PT and OT eval. >> Sep 23, 2017 10:11 AM Ivar Drape wrote: Vicente Males PT w/[facility] [phone] needs verbal orders for PT 2 times a week for 4 weeks effective 09/22/17.

## 2017-09-25 NOTE — Telephone Encounter (Signed)
Lavalle a nurse w/[facility] [phone] wanted to report to the provider that the patient's blood pressure is elevated, Right Arm - 164/88 and Left Arm - 178/90.  Please advise.

## 2017-09-26 ENCOUNTER — Other Ambulatory Visit (INDEPENDENT_AMBULATORY_CARE_PROVIDER_SITE_OTHER): Payer: Medicare Other

## 2017-09-26 ENCOUNTER — Encounter: Payer: Self-pay | Admitting: Internal Medicine

## 2017-09-26 ENCOUNTER — Ambulatory Visit (INDEPENDENT_AMBULATORY_CARE_PROVIDER_SITE_OTHER): Payer: Medicare Other | Admitting: Internal Medicine

## 2017-09-26 VITALS — BP 160/90 | HR 64 | Temp 98.3°F | Resp 18 | Ht 68.0 in | Wt 149.0 lb

## 2017-09-26 DIAGNOSIS — E785 Hyperlipidemia, unspecified: Secondary | ICD-10-CM

## 2017-09-26 DIAGNOSIS — F1027 Alcohol dependence with alcohol-induced persisting dementia: Secondary | ICD-10-CM

## 2017-09-26 DIAGNOSIS — E519 Thiamine deficiency, unspecified: Secondary | ICD-10-CM | POA: Diagnosis not present

## 2017-09-26 DIAGNOSIS — I10 Essential (primary) hypertension: Secondary | ICD-10-CM

## 2017-09-26 DIAGNOSIS — D520 Dietary folate deficiency anemia: Secondary | ICD-10-CM

## 2017-09-26 LAB — CBC WITH DIFFERENTIAL/PLATELET
Basophils Absolute: 0 10*3/uL (ref 0.0–0.1)
Basophils Relative: 0.4 % (ref 0.0–3.0)
EOS ABS: 0.1 10*3/uL (ref 0.0–0.7)
Eosinophils Relative: 1.5 % (ref 0.0–5.0)
HCT: 35.8 % — ABNORMAL LOW (ref 39.0–52.0)
Hemoglobin: 12.2 g/dL — ABNORMAL LOW (ref 13.0–17.0)
LYMPHS ABS: 1.1 10*3/uL (ref 0.7–4.0)
Lymphocytes Relative: 15.8 % (ref 12.0–46.0)
MCHC: 33.9 g/dL (ref 30.0–36.0)
MCV: 99.7 fl (ref 78.0–100.0)
Monocytes Absolute: 0.5 10*3/uL (ref 0.1–1.0)
Monocytes Relative: 7.1 % (ref 3.0–12.0)
NEUTROS ABS: 5.4 10*3/uL (ref 1.4–7.7)
NEUTROS PCT: 75.2 % (ref 43.0–77.0)
PLATELETS: 174 10*3/uL (ref 150.0–400.0)
RBC: 3.59 Mil/uL — ABNORMAL LOW (ref 4.22–5.81)
RDW: 14.7 % (ref 11.5–15.5)
WBC: 7.2 10*3/uL (ref 4.0–10.5)

## 2017-09-26 LAB — URINALYSIS, ROUTINE W REFLEX MICROSCOPIC
BILIRUBIN URINE: NEGATIVE
HGB URINE DIPSTICK: NEGATIVE
Ketones, ur: NEGATIVE
Leukocytes, UA: NEGATIVE
NITRITE: NEGATIVE
Specific Gravity, Urine: <=1.005 — AB (ref 1.000–1.030)
TOTAL PROTEIN, URINE-UPE24: NEGATIVE
Urine Glucose: NEGATIVE
Urobilinogen, UA: 0.2 (ref 0.0–1.0)
pH: 6.5 (ref 5.0–8.0)

## 2017-09-26 LAB — BASIC METABOLIC PANEL
BUN: 10 mg/dL (ref 6–23)
CHLORIDE: 101 meq/L (ref 96–112)
CO2: 24 mEq/L (ref 19–32)
Calcium: 9.6 mg/dL (ref 8.4–10.5)
Creatinine, Ser: 0.97 mg/dL (ref 0.40–1.50)
GFR: 100.1 mL/min (ref >60.00)
Glucose, Bld: 98 mg/dL (ref 70–99)
POTASSIUM: 3.9 meq/L (ref 3.5–5.1)
Sodium: 138 mEq/L (ref 135–145)

## 2017-09-26 LAB — LIPID PANEL
Cholesterol: 199 mg/dL (ref 0–200)
HDL: 70.4 mg/dL (ref >39.00)
LDL CALC: 105 mg/dL — AB (ref 0–99)
NonHDL: 128.7
TRIGLYCERIDES: 120 mg/dL (ref 0.0–149.0)
Total CHOL/HDL Ratio: 3
VLDL: 24 mg/dL (ref 0.0–40.0)

## 2017-09-26 LAB — FOLATE: Folate: >23.9 ng/mL (ref >5.9)

## 2017-09-26 LAB — TSH: TSH: 1.78 u[IU]/mL (ref 0.35–4.50)

## 2017-09-26 MED ORDER — FOLIC ACID 1 MG PO TABS: 1.0000 mg | ORAL_TABLET | Freq: Every day | ORAL | 1 refills | Status: DC

## 2017-09-26 MED ORDER — VITAMIN B-1 50 MG PO TABS: 50.0000 mg | ORAL_TABLET | Freq: Every day | ORAL | 1 refills | Status: DC

## 2017-09-26 MED ORDER — CLONIDINE HCL 0.2 MG/24HR TD PTWK: 0.2000 mg | MEDICATED_PATCH | TRANSDERMAL | 0 refills | Status: DC

## 2017-09-26 MED ORDER — ATORVASTATIN CALCIUM 20 MG PO TABS: 20.0000 mg | ORAL_TABLET | Freq: Every day | ORAL | 1 refills | Status: DC

## 2017-09-26 NOTE — Patient Instructions (Signed)

## 2017-09-26 NOTE — Telephone Encounter (Signed)
Pt is scheduled for today  °

## 2017-09-26 NOTE — Telephone Encounter (Signed)
Spoke to Scott Lindsey and informed that we will get pt in for an appt.   Pt has one scheduled for 9/26 but we need to get him in sooner. Can you contact him and schedule for a sooner day. Whatever time/day pt can come in.

## 2017-09-26 NOTE — Progress Notes (Signed)
Subjective:  Patient ID: Scott Lindsey, male    DOB: July 16, 1955  Age: 62 y.o. MRN: [id number]  CC: Hypertension and Hyperlipidemia   HPI Scott Lindsey presents for f/up - Approximately a month ago he was admitted to the ICU at [facility] 5 days for an episode of Stevens-Johnson syndrome that was complicated by alcohol abuse.  He was discharged and has been in rehab for several weeks.  He has been home for the last week or 2 and the home health care nurse has been concerned that his blood pressure has not been well controlled.  He has had some systolic blood pressures as high as 150-160.  He has abstained from alcohol since discharge but he still smokes cigarettes.   Outpatient Medications Prior to Visit  Medication Sig Dispense Refill  . predniSONE (DELTASONE) 10 MG tablet TAKE 1 TABLET BY MOUTH ONCE DAILY RELATED TO ALLERGIC CONTACT DERMATITIS  0  . diphenhydrAMINE (BENADRYL) 25 MG tablet Take 25 mg by mouth every 6 (six) hours as needed for itching or allergies.    . Pramoxine-Camphor-Zinc Acetate (ANTI-ITCH CLEAR EX) Apply 1 application topically 2 (two) times daily as needed (itching).    . thiamine (VITAMIN B-1) 50 MG tablet Take 1 tablet (50 mg total) by mouth daily. (Patient not taking: Reported on 09/26/2017) 90 tablet 3   No facility-administered medications prior to visit.     ROS Review of Systems  Constitutional: Negative for appetite change, diaphoresis, fatigue and unexpected weight change.  HENT: Negative.   Eyes: Negative for visual disturbance.  Respiratory: Negative for cough, chest tightness, shortness of breath and wheezing.   Cardiovascular: Negative for chest pain, palpitations and leg swelling.  Gastrointestinal: Negative.  Negative for abdominal pain, constipation, diarrhea, nausea and vomiting.  Genitourinary: Negative.  Negative for difficulty urinating.  Musculoskeletal: Negative for arthralgias and neck pain.  Skin: Negative for color change, pallor  and rash.  Neurological: Positive for tremors. Negative for dizziness, weakness, light-headedness and headaches.  Hematological: Negative for adenopathy. Does not bruise/bleed easily.  Psychiatric/Behavioral: Positive for confusion and decreased concentration. Negative for agitation, behavioral problems, dysphoric mood, sleep disturbance and suicidal ideas. The patient is not nervous/anxious.     Objective:  BP (!) 160/90 (BP Location: Left Arm, Patient Position: Sitting, Cuff Size: Normal)   Pulse 64   Temp 98.3 F (36.8 C) (Oral)   Resp 18   Ht 5\' 8"  (1.727 m)   Wt 149 lb (67.6 kg)   SpO2 97%   BMI 22.66 kg/m   BP Readings from Last 3 Encounters:  09/26/17 (!) 160/90  08/29/17 (!) 154/98  06/26/16 126/82    Wt Readings from Last 3 Encounters:  09/26/17 149 lb (67.6 kg)  08/28/17 140 lb (63.5 kg)  06/26/16 142 lb (64.4 kg)    Physical Exam  Constitutional: He is oriented to person, place, and time. No distress.  HENT:  Mouth/Throat: Oropharynx is clear and moist. No oropharyngeal exudate.  Eyes: Conjunctivae are normal. No scleral icterus.  Neck: Normal range of motion. Neck supple. No JVD present. No thyromegaly present.  Cardiovascular: Normal rate, regular rhythm and normal heart sounds. Exam reveals no gallop.  No murmur heard. Pulmonary/Chest: Effort normal and breath sounds normal. No respiratory distress. He has no wheezes. He has no rales.  Abdominal: Soft. Bowel sounds are normal. He exhibits no mass. There is no tenderness.  Musculoskeletal: Normal range of motion. He exhibits no edema, tenderness or deformity.  Lymphadenopathy:  He has no cervical adenopathy.  Neurological: He is alert and oriented to person, place, and time.  Skin: Skin is warm and dry. No rash noted. He is not diaphoretic.  Psychiatric: He has a normal mood and affect. Judgment and thought content normal. His mood appears not anxious. His speech is delayed and tangential. His speech is not  rapid and/or pressured and not slurred. He is slowed and withdrawn. Thought content is not paranoid. Cognition and memory are impaired. He does not exhibit a depressed mood. He expresses no homicidal and no suicidal ideation. He exhibits abnormal recent memory.  Vitals reviewed.   Lab Results  Component Value Date   WBC 7.2 09/26/2017   HGB 12.2 (L) 09/26/2017   HCT 35.8 (L) 09/26/2017   PLT 174.0 09/26/2017   GLUCOSE 98 09/26/2017   CHOL 199 09/26/2017   TRIG 120.0 09/26/2017   HDL 70.40 09/26/2017   LDLCALC 105 (H) 09/26/2017   ALT 17 08/29/2017   AST 31 08/29/2017   NA 138 09/26/2017   K 3.9 09/26/2017   CL 101 09/26/2017   CREATININE 0.97 09/26/2017   BUN 10 09/26/2017   CO2 24 09/26/2017   TSH 1.78 09/26/2017   PSA 0.00 Repeated and verified X2. (L) 04/26/2016    No results found.  Assessment & Plan:   Scott Lindsey was seen today for hypertension and hyperlipidemia.  Diagnoses and all orders for this visit:  Thiamine deficiency- His H&H remain low.  I have asked him to restart the thiamine supplement. -     CBC with Differential/Platelet; Future -     Vitamin B1; Future -     thiamine (VITAMIN B-1) 50 MG tablet; Take 1 tablet (50 mg total) by mouth daily.  Dietary folate deficiency anemia- His H&H are low but his folate level is normal. -     CBC with Differential/Platelet; Future -     Folate; Future -     folic acid (FOLVITE) 1 MG tablet; Take 1 tablet (1 mg total) by mouth daily.  Essential hypertension- He has developed new onset hypertension in the setting of postacute withdrawal syndrome from alcohol.  I think a centrally acting agent like clonidine would be a good choice for him.  His labs are negative for secondary causes or endorgan damage. -     Urinalysis, Routine w reflex microscopic; Future -     cloNIDine (CATAPRES - DOSED IN MG/24 HR) 0.2 mg/24hr patch; Place 1 patch (0.2 mg total) onto the skin once a week. -     Basic metabolic panel;  Future  Hyperlipidemia LDL goal <100- He has an elevated ASCVD risk score so I have asked him to start a statin for CV risk reduction. -     Lipid panel; Future -     TSH; Future -     atorvastatin (LIPITOR) 20 MG tablet; Take 1 tablet (20 mg total) by mouth daily.  Dementia associated with alcoholism without behavioral disturbance ([place])- He was praised for abstaining from alcohol.  Will continue to replace the vitamin deficiencies.   I have discontinued Laksh Hinners. Zenk's diphenhydrAMINE and Pramoxine-Camphor-Zinc Acetate (ANTI-ITCH CLEAR EX). I am also having him start on cloNIDine, folic acid, and atorvastatin. Additionally, I am having him maintain his predniSONE and thiamine.  Meds ordered this encounter  Medications  . cloNIDine (CATAPRES - DOSED IN MG/24 HR) 0.2 mg/24hr patch    Sig: Place 1 patch (0.2 mg total) onto the skin once a week.  Dispense:  12 patch    Refill:  0  . thiamine (VITAMIN B-1) 50 MG tablet    Sig: Take 1 tablet (50 mg total) by mouth daily.    Dispense:  90 tablet    Refill:  1  . folic acid (FOLVITE) 1 MG tablet    Sig: Take 1 tablet (1 mg total) by mouth daily.    Dispense:  90 tablet    Refill:  1  . atorvastatin (LIPITOR) 20 MG tablet    Sig: Take 1 tablet (20 mg total) by mouth daily.    Dispense:  90 tablet    Refill:  1     Follow-up: Return in about 2 months (around 11/26/2017).  Scarlette Calico, MD

## 2017-10-02 DIAGNOSIS — Z7952 Long term (current) use of systemic steroids: Secondary | ICD-10-CM

## 2017-10-02 DIAGNOSIS — I10 Essential (primary) hypertension: Secondary | ICD-10-CM

## 2017-10-02 DIAGNOSIS — F10239 Alcohol dependence with withdrawal, unspecified: Secondary | ICD-10-CM

## 2017-10-02 DIAGNOSIS — E785 Hyperlipidemia, unspecified: Secondary | ICD-10-CM

## 2017-10-02 DIAGNOSIS — D698 Other specified hemorrhagic conditions: Secondary | ICD-10-CM

## 2017-10-02 DIAGNOSIS — L209 Atopic dermatitis, unspecified: Secondary | ICD-10-CM

## 2017-10-02 LAB — VITAMIN B1: Vitamin B1 (Thiamine): 21 nmol/L (ref 8–30)

## 2017-10-03 ENCOUNTER — Inpatient Hospital Stay: Payer: Medicare Other | Admitting: Internal Medicine

## 2017-10-10 ENCOUNTER — Telehealth: Payer: Self-pay | Admitting: Internal Medicine

## 2017-10-10 NOTE — Telephone Encounter (Signed)
Copied from [place] [phone]. Topic: Quick Communication - Rx Refill/Question >> Oct 10, 2017  9:37 AM Scherrie Gerlach wrote: Medication: predniSONE (DELTASONE) 10 MG tablet Wife wants to know does the dr want pt to continue to take this medication? Pt only has a few left from his hospital and rehab stay. Ok to leave message

## 2017-10-11 NOTE — Telephone Encounter (Signed)
Called number listed but no vm to leave a message.   Prednisone rx is not needed. May inform pt of same.

## 2017-11-26 ENCOUNTER — Ambulatory Visit (INDEPENDENT_AMBULATORY_CARE_PROVIDER_SITE_OTHER): Payer: Medicare Other | Admitting: Internal Medicine

## 2017-11-26 ENCOUNTER — Encounter: Payer: Self-pay | Admitting: Internal Medicine

## 2017-11-26 ENCOUNTER — Other Ambulatory Visit (INDEPENDENT_AMBULATORY_CARE_PROVIDER_SITE_OTHER): Payer: Medicare Other

## 2017-11-26 VITALS — BP 142/82 | HR 62 | Temp 98.3°F | Resp 16 | Ht 68.0 in | Wt 157.8 lb

## 2017-11-26 DIAGNOSIS — D520 Dietary folate deficiency anemia: Secondary | ICD-10-CM

## 2017-11-26 DIAGNOSIS — E519 Thiamine deficiency, unspecified: Secondary | ICD-10-CM | POA: Diagnosis not present

## 2017-11-26 DIAGNOSIS — I10 Essential (primary) hypertension: Secondary | ICD-10-CM

## 2017-11-26 DIAGNOSIS — Z23 Encounter for immunization: Secondary | ICD-10-CM

## 2017-11-26 LAB — CBC WITH DIFFERENTIAL/PLATELET
BASOS ABS: 0 10*3/uL (ref 0.0–0.1)
BASOS PCT: 0.5 % (ref 0.0–3.0)
Eosinophils Absolute: 0.1 10*3/uL (ref 0.0–0.7)
Eosinophils Relative: 2.1 % (ref 0.0–5.0)
HCT: 35.5 % — ABNORMAL LOW (ref 39.0–52.0)
Hemoglobin: 11.8 g/dL — ABNORMAL LOW (ref 13.0–17.0)
LYMPHS ABS: 2.4 10*3/uL (ref 0.7–4.0)
LYMPHS PCT: 35.7 % (ref 12.0–46.0)
MCHC: 33.4 g/dL (ref 30.0–36.0)
MCV: 92.4 fl (ref 78.0–100.0)
MONOS PCT: 11.8 % (ref 3.0–12.0)
Monocytes Absolute: 0.8 10*3/uL (ref 0.1–1.0)
NEUTROS ABS: 3.4 10*3/uL (ref 1.4–7.7)
NEUTROS PCT: 49.9 % (ref 43.0–77.0)
PLATELETS: 160 10*3/uL (ref 150.0–400.0)
RBC: 3.84 Mil/uL — ABNORMAL LOW (ref 4.22–5.81)
RDW: 14.8 % (ref 11.5–15.5)
WBC: 6.8 10*3/uL (ref 4.0–10.5)

## 2017-11-26 MED ORDER — CLONIDINE HCL 0.2 MG/24HR TD PTWK: 0.2000 mg | MEDICATED_PATCH | TRANSDERMAL | 1 refills | Status: DC

## 2017-11-26 NOTE — Patient Instructions (Signed)

## 2017-11-26 NOTE — Progress Notes (Signed)
Subjective:  Patient ID: Scott Lindsey, male    DOB: 25-Jan-1955  Age: 62 y.o. MRN: [id number]  CC: Hypertension and Anemia   HPI Scott Lindsey presents for a BP check - He has abstained from alcohol since I last saw him.  He is compliant with the clonidine patch.  He is not sure if he is taking the folic acid and thiamine every day as directed.  He denies headache, blurred vision, CP, DOE, palpitations, edema, or fatigue.  Outpatient Medications Prior to Visit  Medication Sig Dispense Refill  . atorvastatin (LIPITOR) 20 MG tablet Take 1 tablet (20 mg total) by mouth daily. 90 tablet 1  . folic acid (FOLVITE) 1 MG tablet Take 1 tablet (1 mg total) by mouth daily. 90 tablet 1  . thiamine (VITAMIN B-1) 50 MG tablet Take 1 tablet (50 mg total) by mouth daily. 90 tablet 1  . cloNIDine (CATAPRES - DOSED IN MG/24 HR) 0.2 mg/24hr patch Place 1 patch (0.2 mg total) onto the skin once a week. 12 patch 0  . predniSONE (DELTASONE) 10 MG tablet TAKE 1 TABLET BY MOUTH ONCE DAILY RELATED TO ALLERGIC CONTACT DERMATITIS  0   No facility-administered medications prior to visit.     ROS Review of Systems  Constitutional: Negative.  Negative for appetite change, diaphoresis, fatigue and unexpected weight change.  HENT: Negative.   Eyes: Negative for visual disturbance.  Respiratory: Negative for cough, chest tightness, shortness of breath and wheezing.   Cardiovascular: Negative for chest pain, palpitations and leg swelling.  Gastrointestinal: Negative for abdominal pain, diarrhea, nausea and vomiting.  Genitourinary: Negative for difficulty urinating.  Musculoskeletal: Negative.  Negative for arthralgias and myalgias.  Skin: Negative.  Negative for color change.  Neurological: Negative for dizziness, weakness, light-headedness and headaches.  Hematological: Negative for adenopathy. Does not bruise/bleed easily.  Psychiatric/Behavioral: Negative.     Objective:  BP (!) 142/82 (BP Location: Left  Arm, Patient Position: Sitting, Cuff Size: Normal)   Pulse 62   Temp 98.3 F (36.8 C) (Oral)   Resp 16   Ht 5\' 8"  (1.727 m)   Wt 157 lb 12 oz (71.6 kg)   SpO2 97%   BMI 23.99 kg/m   BP Readings from Last 3 Encounters:  11/26/17 (!) 142/82  09/26/17 (!) 160/90  08/29/17 (!) 154/98    Wt Readings from Last 3 Encounters:  11/26/17 157 lb 12 oz (71.6 kg)  09/26/17 149 lb (67.6 kg)  08/28/17 140 lb (63.5 kg)    Physical Exam  Constitutional: He is oriented to person, place, and time. No distress.  HENT:  Mouth/Throat: Oropharynx is clear and moist. No oropharyngeal exudate.  Eyes: Conjunctivae are normal. No scleral icterus.  Neck: Normal range of motion. Neck supple. No JVD present. No thyromegaly present.  Cardiovascular: Normal rate, regular rhythm and normal heart sounds. Exam reveals no gallop.  No murmur heard. Pulmonary/Chest: Effort normal and breath sounds normal. No respiratory distress. He has no wheezes. He has no rales.  Abdominal: Soft. Bowel sounds are normal. He exhibits no mass. There is no hepatosplenomegaly. There is no tenderness.  Musculoskeletal: He exhibits no edema, tenderness or deformity.  Lymphadenopathy:    He has no cervical adenopathy.  Neurological: He is alert and oriented to person, place, and time.  Skin: Skin is warm and dry. No rash noted. He is not diaphoretic.  Psychiatric: He has a normal mood and affect. His behavior is normal. Judgment and thought content normal.  Vitals reviewed.   Lab Results  Component Value Date   WBC 6.8 11/26/2017   HGB 11.8 (L) 11/26/2017   HCT 35.5 (L) 11/26/2017   PLT 160.0 11/26/2017   GLUCOSE 98 09/26/2017   CHOL 199 09/26/2017   TRIG 120.0 09/26/2017   HDL 70.40 09/26/2017   LDLCALC 105 (H) 09/26/2017   ALT 17 08/29/2017   AST 31 08/29/2017   NA 138 09/26/2017   K 3.9 09/26/2017   CL 101 09/26/2017   CREATININE 0.97 09/26/2017   BUN 10 09/26/2017   CO2 24 09/26/2017   TSH 1.78 09/26/2017    PSA 0.00 Repeated and verified X2. (L) 04/26/2016    No results found.  Assessment & Plan:   Scott Lindsey was seen today for hypertension and anemia.  Diagnoses and all orders for this visit:  Essential hypertension- His blood pressure is adequately well controlled.  Will continue the clonidine patch at the current dose. -     cloNIDine (CATAPRES - DOSED IN MG/24 HR) 0.2 mg/24hr patch; Place 1 patch (0.2 mg total) onto the skin once a week.  Dietary folate deficiency anemia- His H&H has not improved much.  I have asked him to be compliant with the folate replacement therapy. -     CBC with Differential/Platelet; Future  Thiamine deficiency- His H&H has not improved much.  I have asked him to be compliant with the thiamine replacement therapy. -     CBC with Differential/Platelet; Future  Need for Tdap vaccination -     Tdap vaccine greater than or equal to 7yo IM  Need for influenza vaccination -     Flu vaccine HIGH DOSE PF (Fluzone High dose)   I have discontinued Scott Lindsey. Scism's predniSONE. I am also having him maintain his thiamine, folic acid, atorvastatin, and cloNIDine.  Meds ordered this encounter  Medications  . cloNIDine (CATAPRES - DOSED IN MG/24 HR) 0.2 mg/24hr patch    Sig: Place 1 patch (0.2 mg total) onto the skin once a week.    Dispense:  12 patch    Refill:  1     Follow-up: Return in about 6 months (around 05/27/2018).  Scarlette Calico, MD

## 2018-03-29 ENCOUNTER — Other Ambulatory Visit: Payer: Self-pay | Admitting: Internal Medicine

## 2018-03-29 DIAGNOSIS — E785 Hyperlipidemia, unspecified: Secondary | ICD-10-CM

## 2018-04-22 ENCOUNTER — Other Ambulatory Visit: Payer: Self-pay | Admitting: Internal Medicine

## 2018-04-22 DIAGNOSIS — D520 Dietary folate deficiency anemia: Secondary | ICD-10-CM

## 2018-05-20 ENCOUNTER — Ambulatory Visit (INDEPENDENT_AMBULATORY_CARE_PROVIDER_SITE_OTHER): Payer: Medicare Other | Admitting: Internal Medicine

## 2018-05-20 ENCOUNTER — Encounter: Payer: Self-pay | Admitting: Internal Medicine

## 2018-05-20 ENCOUNTER — Other Ambulatory Visit (INDEPENDENT_AMBULATORY_CARE_PROVIDER_SITE_OTHER): Payer: Medicare Other

## 2018-05-20 ENCOUNTER — Other Ambulatory Visit: Payer: Self-pay

## 2018-05-20 VITALS — BP 166/86 | HR 64 | Temp 97.6°F | Resp 14 | Ht 68.0 in | Wt 156.0 lb

## 2018-05-20 DIAGNOSIS — I10 Essential (primary) hypertension: Secondary | ICD-10-CM | POA: Diagnosis not present

## 2018-05-20 DIAGNOSIS — E519 Thiamine deficiency, unspecified: Secondary | ICD-10-CM

## 2018-05-20 DIAGNOSIS — D520 Dietary folate deficiency anemia: Secondary | ICD-10-CM

## 2018-05-20 DIAGNOSIS — E559 Vitamin D deficiency, unspecified: Secondary | ICD-10-CM

## 2018-05-20 DIAGNOSIS — F1010 Alcohol abuse, uncomplicated: Secondary | ICD-10-CM

## 2018-05-20 LAB — CBC WITH DIFFERENTIAL/PLATELET
Basophils Absolute: 0 10*3/uL (ref 0.0–0.1)
Basophils Relative: 0.4 % (ref 0.0–3.0)
Eosinophils Absolute: 0.1 10*3/uL (ref 0.0–0.7)
Eosinophils Relative: 1.9 % (ref 0.0–5.0)
HCT: 39.8 % (ref 39.0–52.0)
Hemoglobin: 13.9 g/dL (ref 13.0–17.0)
Lymphocytes Relative: 34.9 % (ref 12.0–46.0)
Lymphs Abs: 2.3 10*3/uL (ref 0.7–4.0)
MCHC: 34.8 g/dL (ref 30.0–36.0)
MCV: 94.6 fl (ref 78.0–100.0)
Monocytes Absolute: 0.7 10*3/uL (ref 0.1–1.0)
Monocytes Relative: 11.4 % (ref 3.0–12.0)
Neutro Abs: 3.3 10*3/uL (ref 1.4–7.7)
Neutrophils Relative %: 51.4 % (ref 43.0–77.0)
Platelets: 150 10*3/uL (ref 150.0–400.0)
RBC: 4.21 Mil/uL — ABNORMAL LOW (ref 4.22–5.81)
RDW: 15.3 % (ref 11.5–15.5)
WBC: 6.5 10*3/uL (ref 4.0–10.5)

## 2018-05-20 LAB — COMPREHENSIVE METABOLIC PANEL
ALT: 13 U/L (ref 0–53)
AST: 23 U/L (ref 0–37)
Albumin: 4.1 g/dL (ref 3.5–5.2)
Alkaline Phosphatase: 136 U/L — ABNORMAL HIGH (ref 39–117)
BUN: 13 mg/dL (ref 6–23)
CO2: 25 mEq/L (ref 19–32)
Calcium: 9.4 mg/dL (ref 8.4–10.5)
Chloride: 101 mEq/L (ref 96–112)
Creatinine, Ser: 1.08 mg/dL (ref 0.40–1.50)
GFR: 83.03 mL/min (ref >60.00)
Glucose, Bld: 107 mg/dL — ABNORMAL HIGH (ref 70–99)
Potassium: 3.9 mEq/L (ref 3.5–5.1)
Sodium: 136 mEq/L (ref 135–145)
Total Bilirubin: 0.5 mg/dL (ref 0.2–1.2)
Total Protein: 8.3 g/dL (ref 6.0–8.3)

## 2018-05-20 LAB — FOLATE: Folate: >23.3 ng/mL (ref >5.9)

## 2018-05-20 LAB — VITAMIN D 25 HYDROXY (VIT D DEFICIENCY, FRACTURES): VITD: 44.22 ng/mL (ref 30.00–100.00)

## 2018-05-20 LAB — VITAMIN B12: Vitamin B-12: 375 pg/mL (ref 211–911)

## 2018-05-20 MED ORDER — INDAPAMIDE 1.25 MG PO TABS: 1.2500 mg | ORAL_TABLET | Freq: Every day | ORAL | 0 refills | Status: DC

## 2018-05-20 NOTE — Progress Notes (Addendum)
Subjective:  Patient ID: Scott Lindsey, male    DOB: [birth year]  Age: 63 y.o. MRN: [id number]  CC: Hypertension and Anemia   HPI Scott Lindsey presents for f/up - He does not monitor his blood pressure but he tells me he is compliant with the clonidine patch.  He denies any recent episodes of headache, blurred vision, CP, or DOE.  He tells me he is compliant with the folate and thiamine supplements.  His alcohol intake is down to a couple of beers each day.  Outpatient Medications Prior to Visit  Medication Sig Dispense Refill   atorvastatin (LIPITOR) 20 MG tablet Take 1 tablet by mouth once daily 90 tablet 0   cloNIDine (CATAPRES - DOSED IN MG/24 HR) 0.2 mg/24hr patch Place 1 patch (0.2 mg total) onto the skin once a week. 12 patch 1   folic acid (FOLVITE) 1 MG tablet Take 1 tablet by mouth once daily 90 tablet 1   thiamine (VITAMIN B-1) 50 MG tablet Take 1 tablet (50 mg total) by mouth daily. 90 tablet 1   No facility-administered medications prior to visit.     ROS Review of Systems  Constitutional: Negative for diaphoresis, fatigue and unexpected weight change.  HENT: Negative.   Eyes: Negative for visual disturbance.  Respiratory: Negative for cough, chest tightness, shortness of breath and wheezing.   Cardiovascular: Negative for chest pain, palpitations and leg swelling.  Gastrointestinal: Negative for abdominal pain, diarrhea, nausea and vomiting.  Genitourinary: Negative.  Negative for difficulty urinating.  Musculoskeletal: Negative for arthralgias and myalgias.  Skin: Negative.  Negative for color change and pallor.  Neurological: Negative.  Negative for dizziness, weakness and light-headedness.  Hematological: Negative for adenopathy. Does not bruise/bleed easily.  Psychiatric/Behavioral: Negative.     Objective:  BP (!) 166/86 (BP Location: Left Arm, Patient Position: Sitting, Cuff Size: Normal)    Pulse 64    Temp 97.6 F (36.4 C) (Oral)    Resp 14    Ht 5\' 8"   (1.727 m)    Wt 156 lb (70.8 kg)    SpO2 97%    BMI 23.72 kg/m   BP Readings from Last 3 Encounters:  05/20/18 (!) 166/86  11/26/17 (!) 142/82  09/26/17 (!) 160/90    Wt Readings from Last 3 Encounters:  05/20/18 156 lb (70.8 kg)  11/26/17 157 lb 12 oz (71.6 kg)  09/26/17 149 lb (67.6 kg)    Physical Exam Vitals signs reviewed.  Constitutional:      Appearance: He is not ill-appearing or diaphoretic.  HENT:     Nose: Nose normal.     Mouth/Throat:     Mouth: Mucous membranes are moist.     Pharynx: No oropharyngeal exudate or posterior oropharyngeal erythema.  Eyes:     General: No scleral icterus.    Conjunctiva/sclera: Conjunctivae normal.  Neck:     Musculoskeletal: Normal range of motion and neck supple. No muscular tenderness.  Cardiovascular:     Rate and Rhythm: Normal rate and regular rhythm.     Heart sounds: No murmur. No gallop.   Pulmonary:     Effort: Pulmonary effort is normal. No respiratory distress.     Breath sounds: No stridor. Examination of the right-lower field reveals rales. Examination of the left-lower field reveals rales. Rales present. No decreased breath sounds, wheezing or rhonchi.     Comments: + dry rales in both bases Abdominal:     General: Abdomen is flat.  Palpations: There is no hepatomegaly, splenomegaly or mass.     Tenderness: There is no abdominal tenderness. There is no guarding.  Musculoskeletal: Normal range of motion.     Right lower leg: No edema.     Left lower leg: No edema.  Lymphadenopathy:     Cervical: No cervical adenopathy.  Skin:    General: Skin is warm and dry.  Neurological:     General: No focal deficit present.  Psychiatric:        Mood and Affect: Mood normal.        Behavior: Behavior normal.     Lab Results  Component Value Date   WBC 6.5 05/20/2018   HGB 13.9 05/20/2018   HCT 39.8 05/20/2018   PLT 150.0 05/20/2018   GLUCOSE 107 (H) 05/20/2018   CHOL 199 09/26/2017   TRIG 120.0 09/26/2017     HDL 70.40 09/26/2017   LDLCALC 105 (H) 09/26/2017   ALT 13 05/20/2018   AST 23 05/20/2018   NA 136 05/20/2018   K 3.9 05/20/2018   CL 101 05/20/2018   CREATININE 1.08 05/20/2018   BUN 13 05/20/2018   CO2 25 05/20/2018   TSH 1.78 09/26/2017   PSA 0.00 Repeated and verified X2. (L) 04/26/2016      Assessment & Plan:   Scott Lindsey was seen today for hypertension and anemia.  Diagnoses and all orders for this visit:  Essential hypertension-his blood pressure is not adequately well controlled.  I have asked him to add a thiazide diuretic to the clonidine patch. -     Comprehensive metabolic panel; Future -     indapamide (LOZOL) 1.25 MG tablet; Take 1 tablet (1.25 mg total) by mouth daily.  Alcohol abuse- He is limiting his alcohol intake to 2 beers/day day.  His alkaline phosphatase is mildly elevated.  I have asked him to try to abstain from alcohol intake.  -     Comprehensive metabolic panel; Future  Dietary folate deficiency anemia- His H&H are normal now.  I have asked him to continue taking the folate supplement. -     CBC with Differential/Platelet; Future -     Folate; Future -     Vitamin B12; Future  Thiamine deficiency- His H&H are normal now.  I have asked him to continue taking the thiamine supplement as long as he is drinking alcohol. -     CBC with Differential/Platelet; Future -     Vitamin B1; Future  Vitamin D deficiency disease- His vitamin D level is normal now. -     VITAMIN D 25 Hydroxy (Vit-D Deficiency, Fractures); Future   I am having Scott Lindsey. Convey start on indapamide. I am also having him maintain his thiamine, cloNIDine, atorvastatin, and folic acid.  Meds ordered this encounter  Medications   indapamide (LOZOL) 1.25 MG tablet    Sig: Take 1 tablet (1.25 mg total) by mouth daily.    Dispense:  90 tablet    Refill:  0     Follow-up: Return in about 3 months (around 08/20/2018).  Scarlette Calico, MD

## 2018-05-20 NOTE — Patient Instructions (Signed)

## 2018-05-23 ENCOUNTER — Encounter: Payer: Self-pay | Admitting: Internal Medicine

## 2018-05-25 LAB — VITAMIN B1: Vitamin B1 (Thiamine): 38 nmol/L — ABNORMAL HIGH (ref 8–30)

## 2018-06-06 ENCOUNTER — Other Ambulatory Visit: Payer: Self-pay | Admitting: Internal Medicine

## 2018-06-06 DIAGNOSIS — I10 Essential (primary) hypertension: Secondary | ICD-10-CM

## 2018-07-07 ENCOUNTER — Other Ambulatory Visit: Payer: Self-pay | Admitting: Internal Medicine

## 2018-07-07 DIAGNOSIS — E785 Hyperlipidemia, unspecified: Secondary | ICD-10-CM

## 2018-07-07 DIAGNOSIS — I10 Essential (primary) hypertension: Secondary | ICD-10-CM

## 2018-08-20 ENCOUNTER — Encounter: Payer: Self-pay | Admitting: Internal Medicine

## 2018-08-20 ENCOUNTER — Other Ambulatory Visit (INDEPENDENT_AMBULATORY_CARE_PROVIDER_SITE_OTHER): Payer: Medicare Other

## 2018-08-20 ENCOUNTER — Other Ambulatory Visit: Payer: Self-pay

## 2018-08-20 ENCOUNTER — Ambulatory Visit (INDEPENDENT_AMBULATORY_CARE_PROVIDER_SITE_OTHER): Payer: Medicare Other | Admitting: Internal Medicine

## 2018-08-20 VITALS — BP 142/78 | HR 88 | Temp 98.0°F | Resp 16 | Ht 68.0 in | Wt 148.0 lb

## 2018-08-20 DIAGNOSIS — I10 Essential (primary) hypertension: Secondary | ICD-10-CM

## 2018-08-20 DIAGNOSIS — E876 Hypokalemia: Secondary | ICD-10-CM | POA: Diagnosis not present

## 2018-08-20 DIAGNOSIS — Z23 Encounter for immunization: Secondary | ICD-10-CM | POA: Diagnosis not present

## 2018-08-20 DIAGNOSIS — T50905A Adverse effect of unspecified drugs, medicaments and biological substances, initial encounter: Secondary | ICD-10-CM | POA: Diagnosis not present

## 2018-08-20 LAB — TSH: TSH: 1.63 u[IU]/mL (ref 0.35–4.50)

## 2018-08-20 LAB — BASIC METABOLIC PANEL
BUN: 14 mg/dL (ref 6–23)
CO2: 27 mEq/L (ref 19–32)
Calcium: 9.7 mg/dL (ref 8.4–10.5)
Chloride: 97 mEq/L (ref 96–112)
Creatinine, Ser: 1.17 mg/dL (ref 0.40–1.50)
GFR: 75.65 mL/min (ref >60.00)
Glucose, Bld: 114 mg/dL — ABNORMAL HIGH (ref 70–99)
Potassium: 3.4 mEq/L — ABNORMAL LOW (ref 3.5–5.1)
Sodium: 136 mEq/L (ref 135–145)

## 2018-08-20 MED ORDER — POTASSIUM CHLORIDE CRYS ER 20 MEQ PO TBCR: 20.0000 meq | EXTENDED_RELEASE_TABLET | Freq: Two times a day (BID) | ORAL | 1 refills | Status: DC

## 2018-08-20 MED ORDER — CLONIDINE HCL 0.1 MG PO TABS: 0.1000 mg | ORAL_TABLET | Freq: Three times a day (TID) | ORAL | 1 refills | Status: DC

## 2018-08-20 NOTE — Progress Notes (Signed)
Subjective:  Patient ID: Scott Lindsey, male    DOB: [birth year]  Age: 63 y.o. MRN: [id number]  CC: Hypertension   HPI ELIZEO RODRIQUES presents for f/up - He complains that the clonidine patch is too expensive and needs to be changed to an alternative.  He has however been compliant with the patch recently.  He is tolerating indapamide well and tells me his blood pressure has been well controlled.  He is active and denies any recent episodes of CP, DOE, palpitations, edema, or fatigue.  Outpatient Medications Prior to Visit  Medication Sig Dispense Refill  . atorvastatin (LIPITOR) 20 MG tablet Take 1 tablet by mouth once daily 90 tablet 1  . folic acid (FOLVITE) 1 MG tablet Take 1 tablet by mouth once daily 90 tablet 1  . indapamide (LOZOL) 1.25 MG tablet Take 1 tablet (1.25 mg total) by mouth daily. 90 tablet 0  . thiamine (VITAMIN B-1) 50 MG tablet Take 1 tablet (50 mg total) by mouth daily. 90 tablet 1  . cloNIDine (CATAPRES - DOSED IN MG/24 HR) 0.2 mg/24hr patch APPLY 1 PATCH TOPICALLY TO SKIN ONCE A WEEK 12 patch 1   No facility-administered medications prior to visit.     ROS Review of Systems  Constitutional: Negative.  Negative for diaphoresis, fatigue and unexpected weight change.  HENT: Negative.   Eyes: Negative for visual disturbance.  Respiratory: Negative for cough, chest tightness, shortness of breath and wheezing.   Cardiovascular: Negative for chest pain, palpitations and leg swelling.  Gastrointestinal: Negative for abdominal pain, diarrhea, nausea and vomiting.  Endocrine: Negative.   Genitourinary: Negative.  Negative for difficulty urinating.  Musculoskeletal: Negative.  Negative for arthralgias and myalgias.  Skin: Negative.  Negative for color change.  Neurological: Negative.  Negative for dizziness, weakness, light-headedness and headaches.  Hematological: Negative for adenopathy. Does not bruise/bleed easily.  Psychiatric/Behavioral: Negative.      Objective:  BP (!) 142/78 (BP Location: Left Arm, Patient Position: Sitting, Cuff Size: Normal)   Pulse 88 Comment: Irregular  Temp 98 F (36.7 C) (Oral)   Resp 16   Ht 5\' 8"  (1.727 m)   Wt 148 lb (67.1 kg)   SpO2 97%   BMI 22.50 kg/m   BP Readings from Last 3 Encounters:  08/20/18 (!) 142/78  05/20/18 (!) 166/86  11/26/17 (!) 142/82    Wt Readings from Last 3 Encounters:  08/20/18 148 lb (67.1 kg)  05/20/18 156 lb (70.8 kg)  11/26/17 157 lb 12 oz (71.6 kg)    Physical Exam Vitals signs reviewed.  Constitutional:      Appearance: He is not ill-appearing or diaphoretic.  HENT:     Nose: Nose normal.  Eyes:     General: No scleral icterus.    Conjunctiva/sclera: Conjunctivae normal.  Neck:     Musculoskeletal: Normal range of motion.  Cardiovascular:     Rate and Rhythm: Normal rate and regular rhythm.     Heart sounds: No murmur.     Comments: EKG ----  Sinus  Rhythm  Low voltage in limb leads.   ABNORMAL   Pulmonary:     Effort: Pulmonary effort is normal.     Breath sounds: No stridor. No wheezing, rhonchi or rales.  Abdominal:     General: Abdomen is flat. Bowel sounds are normal.     Palpations: There is no hepatomegaly or splenomegaly.     Tenderness: There is no abdominal tenderness.  Musculoskeletal:     Right  lower leg: No edema.     Left lower leg: No edema.  Skin:    General: Skin is warm.     Coloration: Skin is not pale.     Findings: No rash.  Neurological:     General: No focal deficit present.     Mental Status: He is alert and oriented to person, place, and time. Mental status is at baseline.  Psychiatric:        Mood and Affect: Mood normal.        Behavior: Behavior normal.     Lab Results  Component Value Date   WBC 6.5 05/20/2018   HGB 13.9 05/20/2018   HCT 39.8 05/20/2018   PLT 150.0 05/20/2018   GLUCOSE 114 (H) 08/20/2018   CHOL 199 09/26/2017   TRIG 120.0 09/26/2017   HDL 70.40 09/26/2017   LDLCALC 105 (H) 09/26/2017    ALT 13 05/20/2018   AST 23 05/20/2018   NA 136 08/20/2018   K 3.4 (L) 08/20/2018   CL 97 08/20/2018   CREATININE 1.17 08/20/2018   BUN 14 08/20/2018   CO2 27 08/20/2018   TSH 1.63 08/20/2018   PSA 0.00 Repeated and verified X2. (L) 04/26/2016    No results found.  Assessment & Plan:   Michail was seen today for hypertension.  Diagnoses and all orders for this visit:  Essential hypertension- His blood pressure is adequately well controlled.  His EKG is negative for LVH or ischemia.  He has developed hypokalemia so I have asked him to start taking a potassium supplement.  Will change the clonidine patch to clonidine tablets which I assume will be less expensive. -     EKG 12-Lead -     Basic metabolic panel; Future -     TSH; Future -     cloNIDine (CATAPRES) 0.1 MG tablet; Take 1 tablet (0.1 mg total) by mouth 3 (three) times daily. -     potassium chloride SA (K-DUR) 20 MEQ tablet; Take 1 tablet (20 mEq total) by mouth 2 (two) times daily.  Need for pneumococcal vaccination -     Pneumococcal conjugate vaccine 13-valent  Drug-induced hypokalemia -     potassium chloride SA (K-DUR) 20 MEQ tablet; Take 1 tablet (20 mEq total) by mouth 2 (two) times daily.   I have discontinued Agron Swiney. Strzelecki's cloNIDine. I am also having him start on cloNIDine and potassium chloride SA. Additionally, I am having him maintain his thiamine, folic acid, indapamide, and atorvastatin.  Meds ordered this encounter  Medications  . cloNIDine (CATAPRES) 0.1 MG tablet    Sig: Take 1 tablet (0.1 mg total) by mouth 3 (three) times daily.    Dispense:  270 tablet    Refill:  1  . potassium chloride SA (K-DUR) 20 MEQ tablet    Sig: Take 1 tablet (20 mEq total) by mouth 2 (two) times daily.    Dispense:  180 tablet    Refill:  1     Follow-up: Return in about 6 months (around 02/20/2019).  Scarlette Calico, MD

## 2018-08-20 NOTE — Patient Instructions (Signed)

## 2018-09-04 ENCOUNTER — Other Ambulatory Visit: Payer: Self-pay | Admitting: Internal Medicine

## 2018-09-04 DIAGNOSIS — I10 Essential (primary) hypertension: Secondary | ICD-10-CM

## 2018-10-13 ENCOUNTER — Other Ambulatory Visit: Payer: Self-pay | Admitting: Internal Medicine

## 2018-10-13 DIAGNOSIS — D520 Dietary folate deficiency anemia: Secondary | ICD-10-CM

## 2019-01-11 DIAGNOSIS — I493 Ventricular premature depolarization: Secondary | ICD-10-CM | POA: Diagnosis not present

## 2019-01-29 ENCOUNTER — Other Ambulatory Visit: Payer: Self-pay | Admitting: Internal Medicine

## 2019-01-29 DIAGNOSIS — E785 Hyperlipidemia, unspecified: Secondary | ICD-10-CM

## 2019-03-17 ENCOUNTER — Encounter: Payer: Self-pay | Admitting: Internal Medicine

## 2019-03-17 ENCOUNTER — Ambulatory Visit (INDEPENDENT_AMBULATORY_CARE_PROVIDER_SITE_OTHER): Payer: Medicare Other | Admitting: Internal Medicine

## 2019-03-17 ENCOUNTER — Other Ambulatory Visit: Payer: Self-pay

## 2019-03-17 VITALS — BP 142/76 | HR 88 | Temp 98.3°F | Resp 18 | Ht 68.0 in | Wt 143.0 lb

## 2019-03-17 DIAGNOSIS — I493 Ventricular premature depolarization: Secondary | ICD-10-CM | POA: Diagnosis not present

## 2019-03-17 DIAGNOSIS — E519 Thiamine deficiency, unspecified: Secondary | ICD-10-CM

## 2019-03-17 DIAGNOSIS — E785 Hyperlipidemia, unspecified: Secondary | ICD-10-CM | POA: Diagnosis not present

## 2019-03-17 DIAGNOSIS — I10 Essential (primary) hypertension: Secondary | ICD-10-CM

## 2019-03-17 DIAGNOSIS — D520 Dietary folate deficiency anemia: Secondary | ICD-10-CM | POA: Diagnosis not present

## 2019-03-17 DIAGNOSIS — E876 Hypokalemia: Secondary | ICD-10-CM

## 2019-03-17 DIAGNOSIS — C61 Malignant neoplasm of prostate: Secondary | ICD-10-CM

## 2019-03-17 DIAGNOSIS — T50905A Adverse effect of unspecified drugs, medicaments and biological substances, initial encounter: Secondary | ICD-10-CM

## 2019-03-17 LAB — TSH: TSH: 1.6 u[IU]/mL (ref 0.35–4.50)

## 2019-03-17 LAB — BASIC METABOLIC PANEL
BUN: 12 mg/dL (ref 6–23)
CO2: 29 mEq/L (ref 19–32)
Calcium: 9.5 mg/dL (ref 8.4–10.5)
Chloride: 98 mEq/L (ref 96–112)
Creatinine, Ser: 1.12 mg/dL (ref 0.40–1.50)
GFR: 79.42 mL/min (ref >60.00)
Glucose, Bld: 102 mg/dL — ABNORMAL HIGH (ref 70–99)
Potassium: 3.4 mEq/L — ABNORMAL LOW (ref 3.5–5.1)
Sodium: 136 mEq/L (ref 135–145)

## 2019-03-17 LAB — LIPID PANEL
Cholesterol: 145 mg/dL (ref 0–200)
HDL: 70.2 mg/dL (ref >39.00)
LDL Cholesterol: 55 mg/dL (ref 0–99)
NonHDL: 74.52
Total CHOL/HDL Ratio: 2
Triglycerides: 99 mg/dL (ref 0.0–149.0)
VLDL: 19.8 mg/dL (ref 0.0–40.0)

## 2019-03-17 LAB — CBC WITH DIFFERENTIAL/PLATELET
Basophils Absolute: 0 10*3/uL (ref 0.0–0.1)
Basophils Relative: 0.5 % (ref 0.0–3.0)
Eosinophils Absolute: 0.1 10*3/uL (ref 0.0–0.7)
Eosinophils Relative: 0.9 % (ref 0.0–5.0)
HCT: 42.2 % (ref 39.0–52.0)
Hemoglobin: 14.5 g/dL (ref 13.0–17.0)
Lymphocytes Relative: 26 % (ref 12.0–46.0)
Lymphs Abs: 1.5 10*3/uL (ref 0.7–4.0)
MCHC: 34.5 g/dL (ref 30.0–36.0)
MCV: 100.1 fl — ABNORMAL HIGH (ref 78.0–100.0)
Monocytes Absolute: 0.6 10*3/uL (ref 0.1–1.0)
Monocytes Relative: 11 % (ref 3.0–12.0)
Neutro Abs: 3.6 10*3/uL (ref 1.4–7.7)
Neutrophils Relative %: 61.6 % (ref 43.0–77.0)
Platelets: 128 10*3/uL — ABNORMAL LOW (ref 150.0–400.0)
RBC: 4.21 Mil/uL — ABNORMAL LOW (ref 4.22–5.81)
RDW: 14.5 % (ref 11.5–15.5)
WBC: 5.8 10*3/uL (ref 4.0–10.5)

## 2019-03-17 LAB — PSA: PSA: 0 ng/mL — ABNORMAL LOW (ref 0.10–4.00)

## 2019-03-17 LAB — HEPATIC FUNCTION PANEL
ALT: 22 U/L (ref 0–53)
AST: 40 U/L — ABNORMAL HIGH (ref 0–37)
Albumin: 4.1 g/dL (ref 3.5–5.2)
Alkaline Phosphatase: 104 U/L (ref 39–117)
Bilirubin, Direct: 0.3 mg/dL (ref 0.0–0.3)
Total Bilirubin: 0.9 mg/dL (ref 0.2–1.2)
Total Protein: 8.2 g/dL (ref 6.0–8.3)

## 2019-03-17 LAB — FOLATE: Folate: >23.7 ng/mL (ref >5.9)

## 2019-03-17 LAB — MAGNESIUM: Magnesium: 1.3 mg/dL — ABNORMAL LOW (ref 1.5–2.5)

## 2019-03-17 MED ORDER — MAGNESIUM OXIDE 400 MG PO CAPS: 1.0000 | ORAL_CAPSULE | Freq: Every day | ORAL | 1 refills | Status: DC

## 2019-03-17 MED ORDER — POTASSIUM CHLORIDE CRYS ER 20 MEQ PO TBCR: 20.0000 meq | EXTENDED_RELEASE_TABLET | Freq: Two times a day (BID) | ORAL | 1 refills | Status: DC

## 2019-03-17 NOTE — Progress Notes (Signed)
Subjective:  Patient ID: Scott Lindsey, male    DOB: 09/13/1955  Age: 64 y.o. MRN: [id number]  CC: Annual Exam  This visit occurred during the SARS-CoV-2 public health emergency.  Safety protocols were in place, including screening questions prior to the visit, additional usage of staff PPE, and extensive cleaning of exam room while observing appropriate contact time as indicated for disinfecting solutions.    HPI [name] presents for a CPX.  He is a poor historian but it sounds like he has developed palpitations since I last saw him.  He cannot describe them in any better detail.  He denies diaphoresis, dizziness, lightheadedness, near syncope, chest pain, shortness of breath, or edema.  He continues to smoke cigarettes and drink alcohol.  Outpatient Medications Prior to Visit  Medication Sig Dispense Refill  . atorvastatin (LIPITOR) 20 MG tablet Take 1 tablet by mouth once daily 90 tablet 0  . cloNIDine (CATAPRES) 0.1 MG tablet Take 1 tablet (0.1 mg total) by mouth 3 (three) times daily. [id number] tablet 1  . folic acid (FOLVITE) 1 MG tablet Take 1 tablet by mouth once daily 90 tablet 1  . thiamine (VITAMIN B-1) 50 MG tablet Take 1 tablet (50 mg total) by mouth daily. 90 tablet 1  . indapamide (LOZOL) 1.25 MG tablet Take 1 tablet by mouth once daily 90 tablet 1  . potassium chloride SA (K-DUR) 20 MEQ tablet Take 1 tablet (20 mEq total) by mouth 2 (two) times daily. 180 tablet 1   No facility-administered medications prior to visit.    ROS Review of Systems  Constitutional: Negative.  Negative for chills, diaphoresis and fatigue.  HENT: Negative.   Eyes: Negative.  Negative for visual disturbance.  Respiratory: Negative for cough, chest tightness, shortness of breath and wheezing.   Cardiovascular: Positive for palpitations. Negative for chest pain and leg swelling.  Gastrointestinal: Negative for abdominal pain, constipation, diarrhea, nausea and vomiting.  Endocrine: Negative.     Genitourinary: Negative.  Negative for difficulty urinating, penile swelling, scrotal swelling, testicular pain and urgency.  Musculoskeletal: Negative.  Negative for arthralgias and myalgias.  Skin: Negative.  Negative for color change and pallor.  Neurological: Negative for dizziness, weakness, light-headedness and headaches.  Hematological: Negative for adenopathy. Does not bruise/bleed easily.  Psychiatric/Behavioral: Negative.     Objective:  BP (!) 142/76 (BP Location: Left Arm, Patient Position: Sitting, Cuff Size: Normal)   Pulse 88   Temp 98.3 F (36.8 C) (Oral)   Resp 18   Ht 5\' 8"  (1.727 m)   Wt 143 lb (64.9 kg)   SpO2 98%   BMI 21.74 kg/m   BP Readings from Last 3 Encounters:  03/18/19 135/60  03/17/19 (!) 142/76  08/20/18 (!) 142/78    Wt Readings from Last 3 Encounters:  03/18/19 147 lb 6.4 oz (66.9 kg)  03/17/19 143 lb (64.9 kg)  08/20/18 148 lb (67.1 kg)    Physical Exam Vitals reviewed.  Constitutional:      Appearance: Normal appearance.  HENT:     Nose: Nose normal.     Mouth/Throat:     Pharynx: No oropharyngeal exudate.  Eyes:     General: No scleral icterus.    Conjunctiva/sclera: Conjunctivae normal.  Cardiovascular:     Rate and Rhythm: Normal rate. Frequent extrasystoles are present.    Heart sounds: Normal heart sounds, S1 normal and S2 normal. No murmur. No gallop.      Comments: EKG -  Sinus rhythm with  frequent PVC's Low voltage in aVR - ? Septal infarct No acute ST/T wave changes Pulmonary:     Breath sounds: No stridor. No wheezing, rhonchi or rales.  Abdominal:     General: Abdomen is flat. Bowel sounds are normal. There is no distension.     Palpations: Abdomen is soft. There is no hepatomegaly, splenomegaly or mass.     Tenderness: There is no abdominal tenderness.     Hernia: No hernia is present. There is no hernia in the left inguinal area or right inguinal area.  Genitourinary:    Pubic Area: No rash.      Penis:  Normal. No discharge, swelling or lesions.      Testes: Normal.        Right: Mass or tenderness not present.        Left: Mass or tenderness not present.     Epididymis:     Right: Normal. No mass.     Left: Normal. No mass.     Prostate: Normal. Not enlarged, not tender and no nodules present.     Rectum: Normal. Guaiac result negative. No mass, tenderness, anal fissure, external hemorrhoid or internal hemorrhoid. Normal anal tone.  Musculoskeletal:        General: Normal range of motion.     Cervical back: Neck supple.     Right lower leg: No edema.     Left lower leg: No edema.  Lymphadenopathy:     Cervical: No cervical adenopathy.     Lower Body: No right inguinal adenopathy. No left inguinal adenopathy.  Skin:    General: Skin is warm.  Neurological:     General: No focal deficit present.     Mental Status: He is alert.  Psychiatric:        Mood and Affect: Mood normal.        Behavior: Behavior normal.     Lab Results  Component Value Date   WBC 5.8 03/17/2019   HGB 14.5 03/17/2019   HCT 42.2 03/17/2019   PLT 128.0 (L) 03/17/2019   GLUCOSE 102 (H) 03/17/2019   CHOL 145 03/17/2019   TRIG 99.0 03/17/2019   HDL 70.20 03/17/2019   LDLCALC 55 03/17/2019   ALT 22 03/17/2019   AST 40 (H) 03/17/2019   NA 136 03/17/2019   K 3.4 (L) 03/17/2019   CL 98 03/17/2019   CREATININE 1.12 03/17/2019   BUN 12 03/17/2019   CO2 29 03/17/2019   TSH 1.60 03/17/2019   PSA 0.00 (L) 03/17/2019    No results found.  Assessment & Plan:   Semaj was seen today for annual exam.  Diagnoses and all orders for this visit:  Prostate cancer [facility])- His PSA is undetectable which is a reassuring sign that there is no recurrence of prostate cancer. -     PSA  Dietary folate deficiency anemia- His H&H are normal but his platelet count is slightly low.  Will continue the current folate supplement. -     CBC with Differential/Platelet -     Folate  Drug-induced hypokalemia- Will  discontinue indapamide and start a potassium supplement. -     Basic metabolic panel -     Discontinue: potassium chloride SA (KLOR-CON) 20 MEQ tablet; Take 1 tablet (20 mEq total) by mouth 2 (two) times daily.  Hyperlipidemia LDL goal <100- He has achieved his LDL goal and doing well on the statin. -     Lipid panel -     Hepatic function  panel  Thiamine deficiency- I will monitor his thiamine level. -     Vitamin B1  Essential hypertension- His blood pressure is adequately well controlled but he has developed hypokalemia and hypomagnesemia as a result of the indapamide.  Will discontinue indapamide and start potassium and magnesium supplements. -     EKG 12-Lead -     TSH -     Discontinue: potassium chloride SA (KLOR-CON) 20 MEQ tablet; Take 1 tablet (20 mEq total) by mouth 2 (two) times daily.  Symptomatic PVCs- I will treat the electrolyte disturbances.  I have asked him to see cardiology to see if this requires treatment. -     Ambulatory referral to Cardiology -     TSH -     Magnesium  Hypomagnesemia -     Magnesium Oxide 400 MG CAPS; Take 1 capsule (400 mg total) by mouth daily.   I have discontinued Nettie Facenda. Wanamaker's potassium chloride SA. I am also having him start on Magnesium Oxide. Additionally, I am having him maintain his thiamine, cloNIDine, folic acid, and atorvastatin.  Meds ordered this encounter  Medications  . DISCONTD: potassium chloride SA (KLOR-CON) 20 MEQ tablet    Sig: Take 1 tablet (20 mEq total) by mouth 2 (two) times daily.    Dispense:  180 tablet    Refill:  1  . Magnesium Oxide 400 MG CAPS    Sig: Take 1 capsule (400 mg total) by mouth daily.    Dispense:  90 capsule    Refill:  1     Follow-up: Return in about 3 months (around 06/17/2019).  Scarlette Calico, MD

## 2019-03-17 NOTE — Patient Instructions (Signed)

## 2019-03-18 ENCOUNTER — Ambulatory Visit (INDEPENDENT_AMBULATORY_CARE_PROVIDER_SITE_OTHER): Payer: Medicare Other | Admitting: Cardiology

## 2019-03-18 ENCOUNTER — Ambulatory Visit: Payer: Medicare Other | Admitting: Cardiology

## 2019-03-18 ENCOUNTER — Encounter: Payer: Self-pay | Admitting: Cardiology

## 2019-03-18 VITALS — BP 135/60 | HR 93 | Temp 97.2°F | Ht 68.0 in | Wt 147.4 lb

## 2019-03-18 DIAGNOSIS — I10 Essential (primary) hypertension: Secondary | ICD-10-CM | POA: Diagnosis not present

## 2019-03-18 DIAGNOSIS — E785 Hyperlipidemia, unspecified: Secondary | ICD-10-CM

## 2019-03-18 DIAGNOSIS — E876 Hypokalemia: Secondary | ICD-10-CM | POA: Diagnosis not present

## 2019-03-18 DIAGNOSIS — I493 Ventricular premature depolarization: Secondary | ICD-10-CM | POA: Diagnosis not present

## 2019-03-18 MED ORDER — LOSARTAN POTASSIUM 50 MG PO TABS: 50.0000 mg | ORAL_TABLET | Freq: Every day | ORAL | 3 refills | Status: DC

## 2019-03-18 NOTE — Progress Notes (Signed)
Cardiology Office Note:    Date:  03/18/2019   ID:  [name], DOB 12/13/1955, MRN [id number]  PCP:  Scott Lima, MD  Cardiologist:  No primary care provider on file.  Electrophysiologist:  None   Referring MD: Scott Lima, MD   Chief Complaint  Patient presents with  . Abnormal ECG    History of Present Illness:    Scott Lindsey is a 64 y.o. male with a hx of prostate cancer, hyperlipidemia, hypertension who is referred by Dr. Ronnald Lindsey for evaluation of PVCs.  Patient saw Dr. Ronnald Lindsey for his annual physical yesterday.  Noted on EKG to have frequent PVCs.  He reports he has been asymptomatic.  Denies any palpitations, chest pain, dyspnea, lower extremity edema. Labs yesterday notable for hypokalemia (3.4), hypomagnesemia (1.3).  He was started on magnesium supplements in addition to his potassium supplements.   Past Medical History:  Diagnosis Date  . Back pain     Past Surgical History:  Procedure Laterality Date  . MULTIPLE TOOTH EXTRACTIONS    . ROBOT ASSISTED LAPAROSCOPIC RADICAL PROSTATECTOMY  12/13/2010   Procedure: ROBOTIC ASSISTED LAPAROSCOPIC RADICAL PROSTATECTOMY;  Surgeon: Scott Amass, MD;  Location: [facility];  Service: Urology;  Laterality: N/A;  with Bilateral Pelivic Lymph Node Dissection    Current Medications: Current Meds  Medication Sig  . atorvastatin (LIPITOR) 20 MG tablet Take 1 tablet by mouth once daily  . cloNIDine (CATAPRES) 0.1 MG tablet Take 1 tablet (0.1 mg total) by mouth 3 (three) times daily.  . folic acid (FOLVITE) 1 MG tablet Take 1 tablet by mouth once daily  . Magnesium Oxide 400 MG CAPS Take 1 capsule (400 mg total) by mouth daily.  Marland Kitchen thiamine (VITAMIN B-1) 50 MG tablet Take 1 tablet (50 mg total) by mouth daily.  . [DISCONTINUED] indapamide (LOZOL) 1.25 MG tablet Take 1 tablet by mouth once daily  . [DISCONTINUED] potassium chloride SA (KLOR-CON) 20 MEQ tablet Take 1 tablet (20 mEq total) by mouth 2 (two) times daily.      Allergies:   Patient has no known allergies.   Social History   Socioeconomic History  . Marital status: Married    Spouse name: Not on file  . Number of children: Not on file  . Years of education: Not on file  . Highest education level: Not on file  Occupational History  . Not on file  Tobacco Use  . Smoking status: Current Every Day Smoker    Packs/day: 0.50    Years: 25.00    Pack years: 12.50    Types: Cigarettes  . Smokeless tobacco: Never Used  Substance and Sexual Activity  . Alcohol use: Yes    Alcohol/week: 15.0 standard drinks    Types: 15 Shots of liquor per week    Comment: occasional  . Drug use: No  . Sexual activity: Yes    Partners: Female  Other Topics Concern  . Not on file  Social History Narrative  . Not on file   Social Determinants of Health   Financial Resource Strain:   . Difficulty of Paying Living Expenses: Not on file  Food Insecurity:   . Worried About Charity fundraiser in the Last Year: Not on file  . Ran Out of Food in the Last Year: Not on file  Transportation Needs:   . Lack of Transportation (Medical): Not on file  . Lack of Transportation (Non-Medical): Not on file  Physical Activity:   .  Days of Exercise per Week: Not on file  . Minutes of Exercise per Session: Not on file  Stress:   . Feeling of Stress : Not on file  Social Connections:   . Frequency of Communication with Friends and Family: Not on file  . Frequency of Social Gatherings with Friends and Family: Not on file  . Attends Religious Services: Not on file  . Active Member of Clubs or Organizations: Not on file  . Attends Archivist Meetings: Not on file  . Marital Status: Not on file     Family History: The patient's family history includes Prostate cancer in his brother.  ROS:   Please see the history of present illness.     All other systems reviewed and are negative.  EKGs/Labs/Other Studies Reviewed:    The following studies were  reviewed today:   EKG:  EKG is  ordered today.  The ekg ordered today demonstrates normal sinus rhythm, frequent PVCs, no ST/T abnormalities  Recent Labs: 03/17/2019: ALT 22; BUN 12; Creatinine, Ser 1.12; Hemoglobin 14.5; Magnesium 1.3; Platelets 128.0; Potassium 3.4; Sodium 136; TSH 1.60  Recent Lipid Panel    Component Value Date/Time   CHOL 145 03/17/2019 1002   TRIG 99.0 03/17/2019 1002   HDL 70.20 03/17/2019 1002   CHOLHDL 2 03/17/2019 1002   VLDL 19.8 03/17/2019 1002   LDLCALC 55 03/17/2019 1002    Physical Exam:    VS:  BP 135/60   Pulse 93   Temp (!) 97.2 F (36.2 C)   Ht 5\' 8"  (1.727 m)   Wt 147 lb 6.4 oz (66.9 kg)   BMI 22.41 kg/m     Wt Readings from Last 3 Encounters:  03/18/19 147 lb 6.4 oz (66.9 kg)  03/17/19 143 lb (64.9 kg)  08/20/18 148 lb (67.1 kg)     GEN: in no acute distress HEENT: Normal NECK: No JVD LYMPHATICS: No lymphadenopathy CARDIAC: Irregular, normal rate, no murmurs RESPIRATORY:  Clear to auscultation without rales, wheezing or rhonchi  ABDOMEN: Soft, non-tender, non-distended MUSCULOSKELETAL: Trace edema SKIN: Warm and dry NEUROLOGIC:  Alert and oriented x 3 PSYCHIATRIC:  Normal affect   ASSESSMENT:    1. Frequent PVCs   2. Essential hypertension   3. Hypomagnesemia   4. Hypokalemia   5. Hyperlipidemia, unspecified hyperlipidemia type    PLAN:    Frequent PVCs: Suspect due to hypomagnesemia/hypokalemia.  Recommend discontinuing indapamide, as likely causing electrolyte abnormalities.  We will instead start on losartan 50 mg daily for hypertension and recheck BMET/magnesium in 1 week.  If PVCs persist once electrolytes normalize, will consider further evaluation with Zio patch to quantify PVC burden and TTE to rule out structural heart disease  Hypertension: On clonidine 0.1 mg twice daily and indapamide daily.  Discontinue indapamide as above and will start losartan  Hypokalemia: Potassium 3.4 on labs 03/17/2019.  Likely due to  indapamide use, will discontinue indapamide.  Can discontinue potassium supplements with stopping indapamide.  Hypomagnesemia: Magnesium 1.3 on labs 03/17/2019.  Continue magnesium supplementation with 400 mg daily, will repeat labs next week.  If normalized, can likely stop magnesium supplements since discontinuing indapamide  Hyperlipidemia: LDL 55.  On atorvastatin 20 mg daily  RTC in 2 to 3 weeks   Medication Adjustments/Labs and Tests Ordered: Current medicines are reviewed at length with the patient today.  Concerns regarding medicines are outlined above.  Orders Placed This Encounter  Procedures  . Basic metabolic panel  . Magnesium  . EKG  12-Lead   Meds ordered this encounter  Medications  . losartan (COZAAR) 50 MG tablet    Sig: Take 1 tablet (50 mg total) by mouth daily.    Dispense:  90 tablet    Refill:  3    Patient Instructions  Medication Instructions:  STOP indapamide (Lozol) STOP potassium START Losartan 50 mg daily  *If you need a refill on your cardiac medications before your next appointment, please call your pharmacy*   Lab Work: RETURN IN 1 WEEK (BMET, MAG)  If you have labs (blood work) drawn today and your tests are completely normal, you will receive your results only by: Marland Kitchen MyChart Message (if you have MyChart) OR . A paper copy in the mail If you have any lab test that is abnormal or we need to change your treatment, we will call you to review the results.   Testing/Procedures: NONE  Follow-Up: At [facility], you and your health needs are our priority.  As part of our continuing mission to provide you with exceptional heart care, we have created designated Provider Care Teams.  These Care Teams include your primary Cardiologist (physician) and Advanced Practice Providers (APPs -  Physician Assistants and Nurse Practitioners) who all work together to provide you with the care you need, when you need it.  We recommend signing up for the  patient portal called "MyChart".  Sign up information is provided on this After Visit Summary.  MyChart is used to connect with patients for Virtual Visits (Telemedicine).  Patients are able to view lab/test results, encounter notes, upcoming appointments, etc.  Non-urgent messages can be sent to your provider as well.   To learn more about what you can do with MyChart, go to [email or web address].    Your next appointment:   2-3 week(s)  The format for your next appointment:   In Person  Provider:   Oswaldo Milian, MD        Signed, [name], MD  03/18/2019 3:18 PM    [place]

## 2019-03-18 NOTE — Patient Instructions (Signed)
Medication Instructions:  STOP indapamide (Lozol) STOP potassium START Losartan 50 mg daily  *If you need a refill on your cardiac medications before your next appointment, please call your pharmacy*   Lab Work: RETURN IN 1 WEEK (BMET, MAG)  If you have labs (blood work) drawn today and your tests are completely normal, you will receive your results only by: Marland Kitchen MyChart Message (if you have MyChart) OR . A paper copy in the mail If you have any lab test that is abnormal or we need to change your treatment, we will call you to review the results.   Testing/Procedures: NONE  Follow-Up: At [facility], you and your health needs are our priority.  As part of our continuing mission to provide you with exceptional heart care, we have created designated Provider Care Teams.  These Care Teams include your primary Cardiologist (physician) and Advanced Practice Providers (APPs -  Physician Assistants and Nurse Practitioners) who all work together to provide you with the care you need, when you need it.  We recommend signing up for the patient portal called "MyChart".  Sign up information is provided on this After Visit Summary.  MyChart is used to connect with patients for Virtual Visits (Telemedicine).  Patients are able to view lab/test results, encounter notes, upcoming appointments, etc.  Non-urgent messages can be sent to your provider as well.   To learn more about what you can do with MyChart, go to [email or web address].    Your next appointment:   2-3 week(s)  The format for your next appointment:   In Person  Provider:   Oswaldo Milian, MD

## 2019-03-21 LAB — VITAMIN B1: Vitamin B1 (Thiamine): 28 nmol/L (ref 8–30)

## 2019-03-24 DIAGNOSIS — E876 Hypokalemia: Secondary | ICD-10-CM | POA: Diagnosis not present

## 2019-03-24 DIAGNOSIS — I10 Essential (primary) hypertension: Secondary | ICD-10-CM | POA: Diagnosis not present

## 2019-03-25 LAB — BASIC METABOLIC PANEL
BUN/Creatinine Ratio: 9 — ABNORMAL LOW (ref 10–24)
BUN: 11 mg/dL (ref 8–27)
CO2: 23 mmol/L (ref 20–29)
Calcium: 9.7 mg/dL (ref 8.6–10.2)
Chloride: 100 mmol/L (ref 96–106)
Creatinine, Ser: 1.19 mg/dL (ref 0.76–1.27)
GFR calc Af Amer: 74 mL/min/{1.73_m2} (ref >59)
GFR calc non Af Amer: 64 mL/min/{1.73_m2} (ref >59)
Glucose: 112 mg/dL — ABNORMAL HIGH (ref 65–99)
Potassium: 4.3 mmol/L (ref 3.5–5.2)
Sodium: 138 mmol/L (ref 134–144)

## 2019-03-25 LAB — MAGNESIUM: Magnesium: 1.6 mg/dL (ref 1.6–2.3)

## 2019-04-08 ENCOUNTER — Encounter: Payer: Self-pay | Admitting: *Deleted

## 2019-04-08 ENCOUNTER — Other Ambulatory Visit: Payer: Self-pay

## 2019-04-08 ENCOUNTER — Ambulatory Visit (INDEPENDENT_AMBULATORY_CARE_PROVIDER_SITE_OTHER): Payer: Medicare Other | Admitting: Cardiology

## 2019-04-08 ENCOUNTER — Encounter: Payer: Self-pay | Admitting: Cardiology

## 2019-04-08 VITALS — BP 138/72 | HR 62 | Ht 68.0 in | Wt 150.4 lb

## 2019-04-08 DIAGNOSIS — I493 Ventricular premature depolarization: Secondary | ICD-10-CM | POA: Diagnosis not present

## 2019-04-08 DIAGNOSIS — E785 Hyperlipidemia, unspecified: Secondary | ICD-10-CM

## 2019-04-08 DIAGNOSIS — I10 Essential (primary) hypertension: Secondary | ICD-10-CM | POA: Diagnosis not present

## 2019-04-08 DIAGNOSIS — Z79899 Other long term (current) drug therapy: Secondary | ICD-10-CM

## 2019-04-08 DIAGNOSIS — E876 Hypokalemia: Secondary | ICD-10-CM | POA: Diagnosis not present

## 2019-04-08 LAB — BASIC METABOLIC PANEL
BUN/Creatinine Ratio: 7 — ABNORMAL LOW (ref 10–24)
BUN: 8 mg/dL (ref 8–27)
CO2: 22 mmol/L (ref 20–29)
Calcium: 9.6 mg/dL (ref 8.6–10.2)
Chloride: 101 mmol/L (ref 96–106)
Creatinine, Ser: 1.19 mg/dL (ref 0.76–1.27)
GFR calc Af Amer: 74 mL/min/{1.73_m2} (ref >59)
GFR calc non Af Amer: 64 mL/min/{1.73_m2} (ref >59)
Glucose: 96 mg/dL (ref 65–99)
Potassium: 4.2 mmol/L (ref 3.5–5.2)
Sodium: 137 mmol/L (ref 134–144)

## 2019-04-08 LAB — MAGNESIUM: Magnesium: 1.6 mg/dL (ref 1.6–2.3)

## 2019-04-08 NOTE — Patient Instructions (Signed)
Medication Instructions:  CONTINUE WITH CURRENT MEDICATIONS. NO CHANGES.  *If you need a refill on your cardiac medications before your next appointment, please call your pharmacy*   Lab Work: TODAY: BMET AND MAG  If you have labs (blood work) drawn today and your tests are completely normal, you will receive your results only by: Marland Kitchen MyChart Message (if you have MyChart) OR . A paper copy in the mail If you have any lab test that is abnormal or we need to change your treatment, we will call you to review the results.   Testing/Procedures: Your physician has requested that you have an echocardiogram. Echocardiography is a painless test that uses sound waves to create images of your heart. It provides your doctor with information about the size and shape of your heart and how well your heart's chambers and valves are working. This procedure takes approximately one hour. There are no restrictions for this procedure.  [place] Monitor Instructions   Your physician has requested you wear your ZIO patch monitor__3__days.   This is a single patch monitor.  [employer] supplies one patch monitor per enrollment.  Additional stickers are not available.   Please do not apply patch if you will be having a Nuclear Stress Test, Echocardiogram, Cardiac CT, MRI, or Chest Xray during the time frame you would be wearing the monitor. The patch cannot be worn during these tests.  You cannot remove and re-apply the ZIO XT patch monitor.   Your ZIO patch monitor will be sent USPS Priority mail from [facility] directly to your home address. The monitor may also be mailed to a PO BOX if home delivery is not available.   It may take 3-5 days to receive your monitor after you have been enrolled.   Once you have received you monitor, please review enclosed instructions.  Your monitor has already been registered assigning a specific monitor serial # to you.   Applying the  monitor   Shave hair from upper left chest.   Hold abrader disc by orange tab.  Rub abrader in 40 strokes over left upper chest as indicated in your monitor instructions.   Clean area with 4 enclosed alcohol pads .  Use all pads to assure are is cleaned thoroughly.  Let dry.   Apply patch as indicated in monitor instructions.  Patch will be place under collarbone on left side of chest with arrow pointing upward.   Rub patch adhesive wings for 2 minutes.Remove white label marked "1".  Remove white label marked "2".  Rub patch adhesive wings for 2 additional minutes.   While looking in a mirror, press and release button in center of patch.  A small green light will flash 3-4 times .  This will be your only indicator the monitor has been turned on.     Do not shower for the first 24 hours.  You may shower after the first 24 hours.   Press button if you feel a symptom. You will hear a small click.  Record Date, Time and Symptom in the Patient Log Book.   When you are ready to remove patch, follow instructions on last 2 pages of Patient Log Book.  Stick patch monitor onto last page of Patient Log Book.   Place Patient Log Book in [place] box.  Use locking tab on box and tape box closed securely.  The [employer] and [employer] has [employer] on it.  Please place  in mailbox as soon as possible.  Your physician should have your test results approximately 7 days after the monitor has been mailed back to [facility].   Call [place] at [phone] if you have questions regarding your ZIO XT patch monitor.  Call them immediately if you see an orange light blinking on your monitor.   If your monitor falls off in less than 4 days contact our Monitor department at [phone].  If your monitor becomes loose or falls off after 4 days call [employer] at [phone] for suggestions on securing your monitor.     Follow-Up: At [facility], you and your health needs are our  priority.  As part of our continuing mission to provide you with exceptional heart care, we have created designated Provider Care Teams.  These Care Teams include your primary Cardiologist (physician) and Advanced Practice Providers (APPs -  Physician Assistants and Nurse Practitioners) who all work together to provide you with the care you need, when you need it.  We recommend signing up for the patient portal called "MyChart".  Sign up information is provided on this After Visit Summary.  MyChart is used to connect with patients for Virtual Visits (Telemedicine).  Patients are able to view lab/test results, encounter notes, upcoming appointments, etc.  Non-urgent messages can be sent to your provider as well.   To learn more about what you can do with MyChart, go to [email or web address].    Your next appointment:   3 month(s)  The format for your next appointment:   Either In Person or Virtual  Provider:   Oswaldo Milian, MD

## 2019-04-08 NOTE — Progress Notes (Signed)
Patient ID: Scott Lindsey, male   DOB: [birth year], 64 y.o.   MRN: [id number] Patient enrolled for [employer] to mail a 3 day ZIO XT long term holter monitor to his home.

## 2019-04-08 NOTE — Progress Notes (Signed)
Cardiology Office Note:    Date:  04/08/2019   ID:  MCDANIEL STONEBURG, DOB [birth year], MRN [id number]  PCP:  [name], MD  Cardiologist:  No primary care provider on file.  Electrophysiologist:  None   Referring MD: [name], MD   Chief Complaint  Patient presents with  . Abnormal ECG    History of Present Illness:    Scott Lindsey is a 64 y.o. male with a hx of prostate cancer, hyperlipidemia, hypertension who presents for follow-up.  He was referred by Dr. Ronnald Ramp for evaluation of PVCs.  Patient saw Dr. Ronnald Ramp for his annual physical and noted on EKG to have frequent PVCs.  He reports he has been asymptomatic.  Denies any palpitations, chest pain, dyspnea, lower extremity edema. Labs 03/17/2019 notable for hypokalemia (3.4), hypomagnesemia (1.3).  He was started on magnesium supplements in addition to his potassium supplements.  At initial visit on 03/18/2019, it was recommended to discontinue indapamide, as suspected that was causing his electrolyte abnormalities.  He was switched to losartan 50 mg daily for hypertension.  Repeat labs on 03/24/2019 showed potassium 4.3, mag 1.6.  Since last clinic visit, he reports that he has been doing well.  Continues to deny any palpitations, but does state when he checks his BP at home his monitor will say his heart rate is irregular at times.  He denies any palpitations, lightheadedness, syncope, chest pain, or dyspnea.  He has not been exercising regularly but walks up the stairs in his home with no exertional symptoms.   Past Medical History:  Diagnosis Date  . Back pain     Past Surgical History:  Procedure Laterality Date  . MULTIPLE TOOTH EXTRACTIONS    . ROBOT ASSISTED LAPAROSCOPIC RADICAL PROSTATECTOMY  12/13/2010   Procedure: ROBOTIC ASSISTED LAPAROSCOPIC RADICAL PROSTATECTOMY;  Surgeon: Bernestine Amass, MD;  Location: [facility];  Service: Urology;  Laterality: N/A;  with Bilateral Pelivic Lymph Node Dissection    Current  Medications: Current Meds  Medication Sig  . atorvastatin (LIPITOR) 20 MG tablet Take 1 tablet by mouth once daily  . cloNIDine (CATAPRES) 0.1 MG tablet Take 1 tablet (0.1 mg total) by mouth 3 (three) times daily.  . folic acid (FOLVITE) 1 MG tablet Take 1 tablet by mouth once daily  . losartan (COZAAR) 50 MG tablet Take 1 tablet (50 mg total) by mouth daily.  . Magnesium Oxide 400 MG CAPS Take 1 capsule (400 mg total) by mouth daily.  Marland Kitchen thiamine (VITAMIN B-1) 50 MG tablet Take 1 tablet (50 mg total) by mouth daily.     Allergies:   Patient has no known allergies.   Social History   Socioeconomic History  . Marital status: Married    Spouse name: Not on file  . Number of children: Not on file  . Years of education: Not on file  . Highest education level: Not on file  Occupational History  . Not on file  Tobacco Use  . Smoking status: Current Every Day Smoker    Packs/day: 0.50    Years: 25.00    Pack years: 12.50    Types: Cigarettes  . Smokeless tobacco: Never Used  Substance and Sexual Activity  . Alcohol use: Yes    Alcohol/week: 15.0 standard drinks    Types: 15 Shots of liquor per week    Comment: occasional  . Drug use: No  . Sexual activity: Yes    Partners: Female  Other Topics Concern  .  Not on file  Social History Narrative  . Not on file   Social Determinants of Health   Financial Resource Strain:   . Difficulty of Paying Living Expenses:   Food Insecurity:   . Worried About Charity fundraiser in the Last Year:   . Arboriculturist in the Last Year:   Transportation Needs:   . Film/video editor (Medical):   Marland Kitchen Lack of Transportation (Non-Medical):   Physical Activity:   . Days of Exercise per Week:   . Minutes of Exercise per Session:   Stress:   . Feeling of Stress :   Social Connections:   . Frequency of Communication with Friends and Family:   . Frequency of Social Gatherings with Friends and Family:   . Attends Religious Services:   .  Active Member of Clubs or Organizations:   . Attends Archivist Meetings:   Marland Kitchen Marital Status:      Family History: The patient's family history includes Prostate cancer in his brother.  ROS:   Please see the history of present illness.     All other systems reviewed and are negative.  EKGs/Labs/Other Studies Reviewed:    The following studies were reviewed today:   EKG:  EKG is  ordered today.  The ekg ordered today demonstrates normal sinus rhythm, rate 62, no PVCs  Recent Labs: 03/17/2019: ALT 22; Hemoglobin 14.5; Platelets 128.0; TSH 1.60 03/24/2019: BUN 11; Creatinine, Ser 1.19; Magnesium 1.6; Potassium 4.3; Sodium 138  Recent Lipid Panel    Component Value Date/Time   CHOL 145 03/17/2019 1002   TRIG 99.0 03/17/2019 1002   HDL 70.20 03/17/2019 1002   CHOLHDL 2 03/17/2019 1002   VLDL 19.8 03/17/2019 1002   LDLCALC 55 03/17/2019 1002    Physical Exam:    VS:  BP 138/72   Pulse 62   Ht 5\' 8"  (1.727 m)   Wt 150 lb 6.4 oz (68.2 kg)   BMI 22.87 kg/m     Wt Readings from Last 3 Encounters:  04/08/19 150 lb 6.4 oz (68.2 kg)  03/18/19 147 lb 6.4 oz (66.9 kg)  03/17/19 143 lb (64.9 kg)     GEN: in no acute distress HEENT: Normal NECK: No JVD LYMPHATICS: No lymphadenopathy CARDIAC: Irregular, normal rate, no murmurs RESPIRATORY:  Clear to auscultation without rales, wheezing or rhonchi  ABDOMEN: Soft, non-tender, non-distended MUSCULOSKELETAL: Trace edema SKIN: Warm and dry NEUROLOGIC:  Alert and oriented x 3 PSYCHIATRIC:  Normal affect   ASSESSMENT:    1. Frequent PVCs   2. Hypokalemia   3. Medication management   4. Hypomagnesemia   5. Essential hypertension   6. Hyperlipidemia, unspecified hyperlipidemia type    PLAN:    Frequent PVCs: Suspect primarily due to hypomagnesemia/hypokalemia, has improved since discontinuing indapamide.  EKG today shows no PVCs, but irregular rhythm during auscultation on exam suggest he is continuing to have  intermittent PVCs.  Will check BMP/magnesium.  Will check TTE to rule out structural heart disease.  Will order Zio patch x3 days to quantify PVC burden.  Hypertension: On clonidine 0.1 mg twice daily and losartan 50 mg daily.  Appears controlled.  Recently discontinud indapamide as above and started losartan  Hypokalemia/hypomagnesemia: Likely due to indapamide use as above, improving since switching to losartan.  Recheck BMP/magnesium  Hyperlipidemia: LDL 55.  On atorvastatin 20 mg daily  RTC in 3 months   Medication Adjustments/Labs and Tests Ordered: Current medicines are reviewed at length  with the patient today.  Concerns regarding medicines are outlined above.  Orders Placed This Encounter  Procedures  . Basic metabolic panel  . Magnesium  . LONG TERM MONITOR (3-14 DAYS)  . EKG 12-Lead  . ECHOCARDIOGRAM COMPLETE   No orders of the defined types were placed in this encounter.   Patient Instructions  Medication Instructions:  CONTINUE WITH CURRENT MEDICATIONS. NO CHANGES.  *If you need a refill on your cardiac medications before your next appointment, please call your pharmacy*   Lab Work: TODAY: BMET AND MAG  If you have labs (blood work) drawn today and your tests are completely normal, you will receive your results only by: Marland Kitchen MyChart Message (if you have MyChart) OR . A paper copy in the mail If you have any lab test that is abnormal or we need to change your treatment, we will call you to review the results.   Testing/Procedures: Your physician has requested that you have an echocardiogram. Echocardiography is a painless test that uses sound waves to create images of your heart. It provides your doctor with information about the size and shape of your heart and how well your heart's chambers and valves are working. This procedure takes approximately one hour. There are no restrictions for this procedure.  [place] Monitor  Instructions   Your physician has requested you wear your ZIO patch monitor__3__days.   This is a single patch monitor.  [employer] supplies one patch monitor per enrollment.  Additional stickers are not available.   Please do not apply patch if you will be having a Nuclear Stress Test, Echocardiogram, Cardiac CT, MRI, or Chest Xray during the time frame you would be wearing the monitor. The patch cannot be worn during these tests.  You cannot remove and re-apply the ZIO XT patch monitor.   Your ZIO patch monitor will be sent USPS Priority mail from [facility] directly to your home address. The monitor Scott also be mailed to a PO BOX if home delivery is not available.   It Scott take 3-5 days to receive your monitor after you have been enrolled.   Once you have received you monitor, please review enclosed instructions.  Your monitor has already been registered assigning a specific monitor serial # to you.   Applying the monitor   Shave hair from upper left chest.   Hold abrader disc by orange tab.  Rub abrader in 40 strokes over left upper chest as indicated in your monitor instructions.   Clean area with 4 enclosed alcohol pads .  Use all pads to assure are is cleaned thoroughly.  Let dry.   Apply patch as indicated in monitor instructions.  Patch will be place under collarbone on left side of chest with arrow pointing upward.   Rub patch adhesive wings for 2 minutes.Remove white label marked "1".  Remove white label marked "2".  Rub patch adhesive wings for 2 additional minutes.   While looking in a mirror, press and release button in center of patch.  A small green light will flash 3-4 times .  This will be your only indicator the monitor has been turned on.     Do not shower for the first 24 hours.  You Scott shower after the first 24 hours.   Press button if you feel a symptom. You will hear a small click.  Record Date, Time and Symptom in the Patient Log Book.   When you are  ready to remove patch, follow instructions on last 2 pages of Patient Log Book.  Stick patch monitor onto last page of Patient Log Book.   Place Patient Log Book in [place] box.  Use locking tab on box and tape box closed securely.  The [employer] and [employer] has [employer] on it.  Please place in mailbox as soon as possible.  Your physician should have your test results approximately 7 days after the monitor has been mailed back to [facility].   Call [place] at [phone] if you have questions regarding your ZIO XT patch monitor.  Call them immediately if you see an orange light blinking on your monitor.   If your monitor falls off in less than 4 days contact our Monitor department at [phone].  If your monitor becomes loose or falls off after 4 days call [employer] at [phone] for suggestions on securing your monitor.     Follow-Up: At [facility], you and your health needs are our priority.  As part of our continuing mission to provide you with exceptional heart care, we have created designated Provider Care Teams.  These Care Teams include your primary Cardiologist (physician) and Advanced Practice Providers (APPs -  Physician Assistants and Nurse Practitioners) who all work together to provide you with the care you need, when you need it.  We recommend signing up for the patient portal called "MyChart".  Sign up information is provided on this After Visit Summary.  MyChart is used to connect with patients for Virtual Visits (Telemedicine).  Patients are able to view lab/test results, encounter notes, upcoming appointments, etc.  Non-urgent messages can be sent to your provider as well.   To learn more about what you can do with MyChart, go to [email or web address].    Your next appointment:   3 month(s)  The format for your next appointment:   Either In Person or Virtual  Provider:   Oswaldo Milian, MD        Signed, [name], MD  04/08/2019 10:51 AM    [place]

## 2019-04-14 ENCOUNTER — Ambulatory Visit (INDEPENDENT_AMBULATORY_CARE_PROVIDER_SITE_OTHER): Payer: Medicare Other

## 2019-04-14 DIAGNOSIS — I493 Ventricular premature depolarization: Secondary | ICD-10-CM | POA: Diagnosis not present

## 2019-04-14 DIAGNOSIS — E876 Hypokalemia: Secondary | ICD-10-CM

## 2019-04-14 DIAGNOSIS — Z79899 Other long term (current) drug therapy: Secondary | ICD-10-CM

## 2019-04-27 ENCOUNTER — Telehealth: Payer: Self-pay | Admitting: Cardiology

## 2019-04-27 NOTE — Telephone Encounter (Signed)
Spoke to patient advised ok for him to mow grass this afternoon.Advised echo is scheduled 4/20 at 9:30 am.No prep is required.

## 2019-04-27 NOTE — Telephone Encounter (Signed)
New Message  Pt called he wants to know if he can cut his grass this afternoon due to him have an ECHO done tomorrow morning. he said its a riding lawnmower.  Please call back to discuss

## 2019-04-28 ENCOUNTER — Ambulatory Visit (HOSPITAL_COMMUNITY): Payer: Medicare Other | Attending: Cardiovascular Disease

## 2019-04-28 ENCOUNTER — Other Ambulatory Visit: Payer: Self-pay

## 2019-04-28 DIAGNOSIS — E876 Hypokalemia: Secondary | ICD-10-CM | POA: Diagnosis not present

## 2019-04-28 DIAGNOSIS — I493 Ventricular premature depolarization: Secondary | ICD-10-CM

## 2019-04-28 DIAGNOSIS — Z79899 Other long term (current) drug therapy: Secondary | ICD-10-CM | POA: Insufficient documentation

## 2019-04-29 ENCOUNTER — Other Ambulatory Visit: Payer: Self-pay | Admitting: Internal Medicine

## 2019-04-29 DIAGNOSIS — E876 Hypokalemia: Secondary | ICD-10-CM | POA: Diagnosis not present

## 2019-04-29 DIAGNOSIS — I493 Ventricular premature depolarization: Secondary | ICD-10-CM | POA: Diagnosis not present

## 2019-04-29 DIAGNOSIS — D520 Dietary folate deficiency anemia: Secondary | ICD-10-CM

## 2019-05-04 ENCOUNTER — Other Ambulatory Visit: Payer: Self-pay | Admitting: *Deleted

## 2019-05-04 MED ORDER — METOPROLOL SUCCINATE ER 25 MG PO TB24
25.0000 mg | ORAL_TABLET | Freq: Every day | ORAL | 3 refills | Status: DC
Start: 2019-05-04 — End: 2019-09-16

## 2019-05-05 ENCOUNTER — Other Ambulatory Visit: Payer: Self-pay | Admitting: Internal Medicine

## 2019-05-05 DIAGNOSIS — E785 Hyperlipidemia, unspecified: Secondary | ICD-10-CM

## 2019-05-27 DIAGNOSIS — Z23 Encounter for immunization: Secondary | ICD-10-CM | POA: Diagnosis not present

## 2019-06-02 ENCOUNTER — Other Ambulatory Visit: Payer: Self-pay | Admitting: Internal Medicine

## 2019-06-02 DIAGNOSIS — I10 Essential (primary) hypertension: Secondary | ICD-10-CM

## 2019-06-23 ENCOUNTER — Ambulatory Visit (INDEPENDENT_AMBULATORY_CARE_PROVIDER_SITE_OTHER): Payer: Medicare Other | Admitting: Internal Medicine

## 2019-06-23 ENCOUNTER — Other Ambulatory Visit: Payer: Self-pay

## 2019-06-23 ENCOUNTER — Encounter: Payer: Self-pay | Admitting: Internal Medicine

## 2019-06-23 VITALS — BP 126/74 | HR 62 | Temp 97.8°F | Resp 12 | Ht 68.0 in | Wt 146.0 lb

## 2019-06-23 DIAGNOSIS — I10 Essential (primary) hypertension: Secondary | ICD-10-CM

## 2019-06-23 DIAGNOSIS — E876 Hypokalemia: Secondary | ICD-10-CM

## 2019-06-23 DIAGNOSIS — T50905A Adverse effect of unspecified drugs, medicaments and biological substances, initial encounter: Secondary | ICD-10-CM

## 2019-06-23 DIAGNOSIS — E519 Thiamine deficiency, unspecified: Secondary | ICD-10-CM

## 2019-06-23 DIAGNOSIS — Z1211 Encounter for screening for malignant neoplasm of colon: Secondary | ICD-10-CM

## 2019-06-23 DIAGNOSIS — D520 Dietary folate deficiency anemia: Secondary | ICD-10-CM

## 2019-06-23 LAB — BASIC METABOLIC PANEL
BUN: 10 mg/dL (ref 6–23)
CO2: 26 mEq/L (ref 19–32)
Calcium: 9.2 mg/dL (ref 8.4–10.5)
Chloride: 102 mEq/L (ref 96–112)
Creatinine, Ser: 1.09 mg/dL (ref 0.40–1.50)
GFR: 81.88 mL/min (ref >60.00)
Glucose, Bld: 104 mg/dL — ABNORMAL HIGH (ref 70–99)
Potassium: 3.8 mEq/L (ref 3.5–5.1)
Sodium: 135 mEq/L (ref 135–145)

## 2019-06-23 LAB — FOLATE: Folate: >24.8 ng/mL

## 2019-06-23 LAB — CBC WITH DIFFERENTIAL/PLATELET
Basophils Absolute: 0 10*3/uL (ref 0.0–0.1)
Basophils Relative: 0.4 % (ref 0.0–3.0)
Eosinophils Absolute: 0.1 10*3/uL (ref 0.0–0.7)
Eosinophils Relative: 1.9 % (ref 0.0–5.0)
HCT: 37.7 % — ABNORMAL LOW (ref 39.0–52.0)
Hemoglobin: 13 g/dL (ref 13.0–17.0)
Lymphocytes Relative: 33.6 % (ref 12.0–46.0)
Lymphs Abs: 1.9 10*3/uL (ref 0.7–4.0)
MCHC: 34.4 g/dL (ref 30.0–36.0)
MCV: 98.5 fl (ref 78.0–100.0)
Monocytes Absolute: 0.8 10*3/uL (ref 0.1–1.0)
Monocytes Relative: 14.4 % — ABNORMAL HIGH (ref 3.0–12.0)
Neutro Abs: 2.9 10*3/uL (ref 1.4–7.7)
Neutrophils Relative %: 49.7 % (ref 43.0–77.0)
Platelets: 138 10*3/uL — ABNORMAL LOW (ref 150.0–400.0)
RBC: 3.83 Mil/uL — ABNORMAL LOW (ref 4.22–5.81)
RDW: 14.3 % (ref 11.5–15.5)
WBC: 5.8 10*3/uL (ref 4.0–10.5)

## 2019-06-23 LAB — MAGNESIUM: Magnesium: 1.6 mg/dL (ref 1.5–2.5)

## 2019-06-23 NOTE — Patient Instructions (Signed)

## 2019-06-23 NOTE — Progress Notes (Signed)
Subjective:  Patient ID: Scott Lindsey, male    DOB: 10/01/1955  Age: 64 y.o. MRN: [id number]  CC: Hypertension  This visit occurred during the SARS-CoV-2 public health emergency.  Safety protocols were in place, including screening questions prior to the visit, additional usage of staff PPE, and extensive cleaning of exam room while observing appropriate contact time as indicated for disinfecting solutions.    HPI Scott Lindsey presents for f/up - He tells me his blood pressure has been well controlled.  He continues to consume a moderate amount of alcohol.  In the past he has had a low platelet count and mildly elevated LFTs.  He is active and denies any recent episodes of chest pain, shortness of breath, palpitations, edema, or fatigue.  Outpatient Medications Prior to Visit  Medication Sig Dispense Refill  . atorvastatin (LIPITOR) 20 MG tablet Take 1 tablet by mouth once daily 90 tablet 1  . cloNIDine (CATAPRES) 0.1 MG tablet TAKE 1 TABLET BY MOUTH THREE TIMES DAILY 696 tablet 0  . folic acid (FOLVITE) 1 MG tablet Take 1 tablet by mouth once daily 90 tablet 1  . Magnesium Oxide 400 MG CAPS Take 1 capsule (400 mg total) by mouth daily. 90 capsule 1  . metoprolol succinate (TOPROL XL) 25 MG 24 hr tablet Take 1 tablet (25 mg total) by mouth daily. 30 tablet 3  . thiamine (VITAMIN B-1) 50 MG tablet Take 1 tablet (50 mg total) by mouth daily. 90 tablet 1  . losartan (COZAAR) 50 MG tablet Take 1 tablet (50 mg total) by mouth daily. 90 tablet 3   No facility-administered medications prior to visit.    ROS Review of Systems  Constitutional: Negative.  Negative for appetite change, diaphoresis, fatigue and unexpected weight change.  HENT: Negative.   Eyes: Negative for visual disturbance.  Respiratory: Negative for cough, chest tightness, shortness of breath and wheezing.   Cardiovascular: Negative for chest pain, palpitations and leg swelling.  Gastrointestinal: Negative for abdominal  pain, blood in stool, constipation, diarrhea, nausea and vomiting.  Endocrine: Negative.   Genitourinary: Negative.  Negative for difficulty urinating.  Musculoskeletal: Negative.  Negative for arthralgias and myalgias.  Skin: Negative.  Negative for color change, pallor and rash.  Neurological: Negative.  Negative for dizziness, weakness, light-headedness and numbness.  Hematological: Negative for adenopathy. Does not bruise/bleed easily.  Psychiatric/Behavioral: Negative.     Objective:  BP 126/74 (BP Location: Left Arm, Patient Position: Sitting, Cuff Size: Normal)   Pulse 62   Temp 97.8 F (36.6 C) (Oral)   Resp 12   Ht 5\' 8"  (1.727 m)   Wt 146 lb (66.2 kg)   SpO2 98%   BMI 22.20 kg/m   BP Readings from Last 3 Encounters:  06/23/19 126/74  04/08/19 138/72  03/18/19 135/60    Wt Readings from Last 3 Encounters:  06/23/19 146 lb (66.2 kg)  04/08/19 150 lb 6.4 oz (68.2 kg)  03/18/19 147 lb 6.4 oz (66.9 kg)    Physical Exam Vitals reviewed.  Constitutional:      Appearance: Normal appearance.  HENT:     Nose: Nose normal.     Mouth/Throat:     Mouth: Mucous membranes are moist.  Eyes:     General: No scleral icterus.    Conjunctiva/sclera: Conjunctivae normal.  Cardiovascular:     Rate and Rhythm: Normal rate and regular rhythm.     Heart sounds: No murmur heard.   Pulmonary:  Effort: Pulmonary effort is normal.     Breath sounds: No stridor. No wheezing, rhonchi or rales.  Abdominal:     General: Abdomen is flat. Bowel sounds are normal. There is no distension.     Palpations: Abdomen is soft. There is no hepatomegaly, splenomegaly or mass.     Tenderness: There is no abdominal tenderness.  Musculoskeletal:        General: Normal range of motion.     Cervical back: Neck supple.     Right lower leg: No edema.     Left lower leg: No edema.  Lymphadenopathy:     Cervical: No cervical adenopathy.  Skin:    General: Skin is warm and dry.  Neurological:       General: No focal deficit present.     Mental Status: He is alert.     Lab Results  Component Value Date   WBC 5.8 06/23/2019   HGB 13.0 06/23/2019   HCT 37.7 (L) 06/23/2019   PLT 138.0 (L) 06/23/2019   GLUCOSE 104 (H) 06/23/2019   CHOL 145 03/17/2019   TRIG 99.0 03/17/2019   HDL 70.20 03/17/2019   LDLCALC 55 03/17/2019   ALT 22 03/17/2019   AST 40 (H) 03/17/2019   NA 135 06/23/2019   K 3.8 06/23/2019   CL 102 06/23/2019   CREATININE 1.09 06/23/2019   BUN 10 06/23/2019   CO2 26 06/23/2019   TSH 1.60 03/17/2019   PSA 0.00 (L) 03/17/2019    No results found.  Assessment & Plan:   Scott Lindsey was seen today for hypertension.  Diagnoses and all orders for this visit:  Screen for colon cancer -     Cologuard  Dietary folate deficiency anemia- He is mildly anemic but his folate level is normal.  There is likely an anemia associated with alcohol intake.  I have asked him to continue his vitamin supplements. -     CBC with Differential/Platelet; Future -     Folate; Future -     Folate -     CBC with Differential/Platelet  Hypomagnesemia- His magnesium level is normal now. -     Magnesium; Future -     Magnesium  Drug-induced hypokalemia- His potassium level is normal now. -     Basic metabolic panel; Future -     Basic metabolic panel  Essential hypertension- His blood pressure is adequately well controlled. -     Basic metabolic panel; Future -     Basic metabolic panel  Thiamine deficiency   I am having Scott Lindsey. Durnin maintain his thiamine, Magnesium Oxide, losartan, folic acid, metoprolol succinate, atorvastatin, and cloNIDine.  No orders of the defined types were placed in this encounter.    Follow-up: Return in about 6 months (around 12/23/2019).  Scarlette Calico, MD

## 2019-06-24 DIAGNOSIS — Z23 Encounter for immunization: Secondary | ICD-10-CM | POA: Diagnosis not present

## 2019-07-01 ENCOUNTER — Other Ambulatory Visit: Payer: Self-pay

## 2019-07-01 ENCOUNTER — Ambulatory Visit (INDEPENDENT_AMBULATORY_CARE_PROVIDER_SITE_OTHER): Payer: Medicare Other | Admitting: Cardiology

## 2019-07-01 ENCOUNTER — Encounter: Payer: Self-pay | Admitting: Cardiology

## 2019-07-01 VITALS — BP 162/70 | HR 53 | Ht 68.0 in | Wt 144.8 lb

## 2019-07-01 DIAGNOSIS — I493 Ventricular premature depolarization: Secondary | ICD-10-CM

## 2019-07-01 DIAGNOSIS — I10 Essential (primary) hypertension: Secondary | ICD-10-CM

## 2019-07-01 DIAGNOSIS — E876 Hypokalemia: Secondary | ICD-10-CM

## 2019-07-01 DIAGNOSIS — Z7289 Other problems related to lifestyle: Secondary | ICD-10-CM | POA: Diagnosis not present

## 2019-07-01 DIAGNOSIS — Z79899 Other long term (current) drug therapy: Secondary | ICD-10-CM | POA: Diagnosis not present

## 2019-07-01 DIAGNOSIS — F1090 Alcohol use, unspecified, uncomplicated: Secondary | ICD-10-CM

## 2019-07-01 DIAGNOSIS — E785 Hyperlipidemia, unspecified: Secondary | ICD-10-CM

## 2019-07-01 DIAGNOSIS — Z789 Other specified health status: Secondary | ICD-10-CM

## 2019-07-01 MED ORDER — LOSARTAN POTASSIUM 100 MG PO TABS
100.0000 mg | ORAL_TABLET | Freq: Every day | ORAL | 3 refills | Status: DC
Start: 2019-07-01 — End: 2020-09-21

## 2019-07-01 NOTE — Progress Notes (Signed)
Cardiology Office Note:    Date:  07/01/2019   ID:  Scott Lindsey, DOB [birth year], MRN [id number]  PCP:  [name], MD  Cardiologist:  No primary care provider on file.  Electrophysiologist:  None   Referring MD: [name], MD   Chief Complaint  Patient presents with  . Abnormal ECG    History of Present Illness:    Scott Lindsey is a 64 y.o. male with a hx of prostate cancer, hyperlipidemia, hypertension who presents for follow-up.  He was referred by Dr. Ronnald Ramp for evaluation of PVCs.  Patient saw Dr. Ronnald Ramp for his annual physical and noted on EKG to have frequent PVCs.  He reports he has been asymptomatic.  Denies any palpitations, chest pain, dyspnea, lower extremity edema. Labs 03/17/2019 notable for hypokalemia (3.4), hypomagnesemia (1.3).  He was started on magnesium supplements in addition to his potassium supplements.  At initial visit on 03/18/2019, it was recommended to discontinue indapamide, as suspected that was causing his electrolyte abnormalities.  He was switched to losartan 50 mg daily for hypertension.  Repeat labs on 03/24/2019 showed potassium 4.3, mag 1.6.  Echocardiogram on 04/28/2019 showed normal biventricular function, mild mitral regurgitation.  Zio patch x3 days on 04/30/2019 showed frequent PVCs (10.4% of beats).  He was started on Toprol-XL 25 mg daily  Since last clinic visit, he reports that he has been doing well.  He denies any chest pain, palpitations, shortness of breath, lightheadedness, or syncope.  States that the most exertion he does is walking upstairs and is however doing yard work.  He denies any exertional symptoms.  He drinks 1 cup of coffee per day.  Rare soda intake.  Drinks 2-3 mini bottles of vodka daily.    Past Medical History:  Diagnosis Date  . Back pain     Past Surgical History:  Procedure Laterality Date  . MULTIPLE TOOTH EXTRACTIONS    . ROBOT ASSISTED LAPAROSCOPIC RADICAL PROSTATECTOMY  12/13/2010   Procedure: ROBOTIC  ASSISTED LAPAROSCOPIC RADICAL PROSTATECTOMY;  Surgeon: Bernestine Amass, MD;  Location: [facility];  Service: Urology;  Laterality: N/A;  with Bilateral Pelivic Lymph Node Dissection    Current Medications: Current Meds  Medication Sig  . atorvastatin (LIPITOR) 20 MG tablet Take 1 tablet by mouth once daily  . cloNIDine (CATAPRES) 0.1 MG tablet TAKE 1 TABLET BY MOUTH THREE TIMES DAILY  . folic acid (FOLVITE) 1 MG tablet Take 1 tablet by mouth once daily  . losartan (COZAAR) 100 MG tablet Take 1 tablet (100 mg total) by mouth daily.  . Magnesium Oxide 400 MG CAPS Take 1 capsule (400 mg total) by mouth daily.  . metoprolol succinate (TOPROL XL) 25 MG 24 hr tablet Take 1 tablet (25 mg total) by mouth daily.  Marland Kitchen thiamine (VITAMIN B-1) 50 MG tablet Take 1 tablet (50 mg total) by mouth daily.  . [DISCONTINUED] losartan (COZAAR) 50 MG tablet Take 1 tablet (50 mg total) by mouth daily.     Allergies:   Patient has no known allergies.   Social History   Socioeconomic History  . Marital status: Married    Spouse name: Not on file  . Number of children: Not on file  . Years of education: Not on file  . Highest education level: Not on file  Occupational History  . Not on file  Tobacco Use  . Smoking status: Current Every Day Smoker    Packs/day: 0.50    Years: 25.00  Pack years: 12.50    Types: Cigarettes  . Smokeless tobacco: Never Used  Substance and Sexual Activity  . Alcohol use: Yes    Alcohol/week: 15.0 standard drinks    Types: 15 Shots of liquor per week    Comment: occasional  . Drug use: No  . Sexual activity: Yes    Partners: Female  Other Topics Concern  . Not on file  Social History Narrative  . Not on file   Social Determinants of Health   Financial Resource Strain:   . Difficulty of Paying Living Expenses:   Food Insecurity:   . Worried About Charity fundraiser in the Last Year:   . Arboriculturist in the Last Year:   Transportation Needs:   . Lexicographer (Medical):   Marland Kitchen Lack of Transportation (Non-Medical):   Physical Activity:   . Days of Exercise per Week:   . Minutes of Exercise per Session:   Stress:   . Feeling of Stress :   Social Connections:   . Frequency of Communication with Friends and Family:   . Frequency of Social Gatherings with Friends and Family:   . Attends Religious Services:   . Active Member of Clubs or Organizations:   . Attends Archivist Meetings:   Marland Kitchen Marital Status:      Family History: The patient's family history includes Prostate cancer in his brother.  ROS:   Please see the history of present illness.     All other systems reviewed and are negative.  EKGs/Labs/Other Studies Reviewed:    The following studies were reviewed today:   EKG:  EKG is  ordered today.  The ekg ordered today demonstrates normal sinus rhythm, rate 53, no PVCs, poor R wave progression  Recent Labs: 03/17/2019: ALT 22; TSH 1.60 06/23/2019: BUN 10; Creatinine, Ser 1.09; Hemoglobin 13.0; Magnesium 1.6; Platelets 138.0; Potassium 3.8; Sodium 135  Recent Lipid Panel    Component Value Date/Time   CHOL 145 03/17/2019 1002   TRIG 99.0 03/17/2019 1002   HDL 70.20 03/17/2019 1002   CHOLHDL 2 03/17/2019 1002   VLDL 19.8 03/17/2019 1002   LDLCALC 55 03/17/2019 1002    Physical Exam:    VS:  BP (!) 162/70   Pulse (!) 53   Ht 5\' 8"  (1.727 m)   Wt 144 lb 12.8 oz (65.7 kg)   BMI 22.02 kg/m     Wt Readings from Last 3 Encounters:  07/01/19 144 lb 12.8 oz (65.7 kg)  06/23/19 146 lb (66.2 kg)  04/08/19 150 lb 6.4 oz (68.2 kg)     GEN: in no acute distress HEENT: Normal NECK: No JVD LYMPHATICS: No lymphadenopathy CARDIAC: Irregular, normal rate, no murmurs RESPIRATORY:  Clear to auscultation without rales, wheezing or rhonchi  ABDOMEN: Soft, non-tender, non-distended MUSCULOSKELETAL: Trace edema SKIN: Warm and dry NEUROLOGIC:  Alert and oriented x 3 PSYCHIATRIC:  Normal affect   ASSESSMENT:     1. Frequent PVCs   2. Medication management   3. Essential hypertension   4. Alcohol use   5. Hypokalemia   6. Hypomagnesemia   7. Hyperlipidemia, unspecified hyperlipidemia type    PLAN:    Frequent PVCs: 10.4% of beats on Zio patch x3 days on 04/30/2019.  Suspect alcohol use is driving his PVCs, as he drinks vodka daily.  Suspect this is also causing his hypomagnesemia, which is also likely contributing to his PVCs. -Continue Toprol-XL 25 mg daily -Recommend alcohol cessation.  He has a history of withdrawal (DTs).  Recommend he discuss plan to wean off alcohol with his PCP, as we will need to go slowly to avoid withdrawal -Plan repeat monitor to ensure improvement in PVCs once on metoprolol and off alcohol  Hypertension: On clonidine 0.1 mg three times daily, losartan 50 mg daily, and Toprol-XL 25 mg daily.  BP elevated in clinic today, will increase losartan to 100 mg daily.  Check BMP in 1 week.  Asked patient to check BP daily for next 2 weeks and call with results.  Hypokalemia/hypomagnesemia: Initially suspected due to indapamide use, which was likely contributing.  Also suspect alcohol use is playing significant role.  Recommend weaning off alcohol as above.  Hyperlipidemia: LDL 55.  On atorvastatin 20 mg daily  Alcohol use: history of history of withdrawal (DTs).  Recommend he discuss plan to wean off alcohol with his PCP, as we will need to go slowly to avoid withdrawal  RTC in 3 months   Medication Adjustments/Labs and Tests Ordered: Current medicines are reviewed at length with the patient today.  Concerns regarding medicines are outlined above.  Orders Placed This Encounter  Procedures  . Basic metabolic panel  . Magnesium  . EKG 12-Lead   Meds ordered this encounter  Medications  . losartan (COZAAR) 100 MG tablet    Sig: Take 1 tablet (100 mg total) by mouth daily.    Dispense:  90 tablet    Refill:  3    Dose increase    Patient Instructions  Medication  Instructions:  INCREASE losartan to 100 mg daily  *If you need a refill on your cardiac medications before your next appointment, please call your pharmacy*   Lab Work: Return for labs in 1 week (6/30) BMET, Mag-No appointment needed.  If you have labs (blood work) drawn today and your tests are completely normal, you will receive your results only by: Marland Kitchen MyChart Message (if you have MyChart) OR . A paper copy in the mail If you have any lab test that is abnormal or we need to change your treatment, we will call you to review the results.  Follow-Up: At [facility], you and your health needs are our priority.  As part of our continuing mission to provide you with exceptional heart care, we have created designated Provider Care Teams.  These Care Teams include your primary Cardiologist (physician) and Advanced Practice Providers (APPs -  Physician Assistants and Nurse Practitioners) who all work together to provide you with the care you need, when you need it.  We recommend signing up for the patient portal called "MyChart".  Sign up information is provided on this After Visit Summary.  MyChart is used to connect with patients for Virtual Visits (Telemedicine).  Patients are able to view lab/test results, encounter notes, upcoming appointments, etc.  Non-urgent messages can be sent to your provider as well.   To learn more about what you can do with MyChart, go to [email or web address].    Your next appointment:   3 month(s)  The format for your next appointment:   In Person  Provider:   Oswaldo Milian, MD   Other Instructions Please check your blood pressure at home daily, write it down.  Call the office or send message via Mychart with the readings in 2 weeks for Dr. Gardiner Rhyme to review.   Please talk with your primary care doctor about a alcohol cessation plan.    Signed, [name], MD  07/01/2019 10:44  AM    [place]

## 2019-07-01 NOTE — Patient Instructions (Signed)
Medication Instructions:  INCREASE losartan to 100 mg daily  *If you need a refill on your cardiac medications before your next appointment, please call your pharmacy*   Lab Work: Return for labs in 1 week (6/30) BMET, Mag-No appointment needed.  If you have labs (blood work) drawn today and your tests are completely normal, you will receive your results only by: Marland Kitchen MyChart Message (if you have MyChart) OR . A paper copy in the mail If you have any lab test that is abnormal or we need to change your treatment, we will call you to review the results.  Follow-Up: At [facility], you and your health needs are our priority.  As part of our continuing mission to provide you with exceptional heart care, we have created designated Provider Care Teams.  These Care Teams include your primary Cardiologist (physician) and Advanced Practice Providers (APPs -  Physician Assistants and Nurse Practitioners) who all work together to provide you with the care you need, when you need it.  We recommend signing up for the patient portal called "MyChart".  Sign up information is provided on this After Visit Summary.  MyChart is used to connect with patients for Virtual Visits (Telemedicine).  Patients are able to view lab/test results, encounter notes, upcoming appointments, etc.  Non-urgent messages can be sent to your provider as well.   To learn more about what you can do with MyChart, go to [email or web address].    Your next appointment:   3 month(s)  The format for your next appointment:   In Person  Provider:   Oswaldo Milian, MD   Other Instructions Please check your blood pressure at home daily, write it down.  Call the office or send message via Mychart with the readings in 2 weeks for Dr. Gardiner Rhyme to review.   Please talk with your primary care doctor about a alcohol cessation plan.

## 2019-07-07 ENCOUNTER — Telehealth: Payer: Self-pay | Admitting: Internal Medicine

## 2019-07-07 LAB — COLOGUARD: Cologuard: NEGATIVE

## 2019-07-07 NOTE — Telephone Encounter (Signed)
Called Charlett Nose (pt spouse). Informed spouse that PCP ordered the Cologuard. Spouse stated understanding and will have pt complete the test.

## 2019-07-07 NOTE — Telephone Encounter (Signed)
New message:    Pt's wife is calling and states they have received a Cologuard box in the mail and would like to know does he need to repeat this. Please advise.

## 2019-07-09 LAB — BASIC METABOLIC PANEL
BUN/Creatinine Ratio: 8 — ABNORMAL LOW (ref 10–24)
BUN: 9 mg/dL (ref 8–27)
CO2: 23 mmol/L (ref 20–29)
Calcium: 9.7 mg/dL (ref 8.6–10.2)
Chloride: 100 mmol/L (ref 96–106)
Creatinine, Ser: 1.1 mg/dL (ref 0.76–1.27)
GFR calc Af Amer: 80 mL/min/{1.73_m2} (ref >59)
GFR calc non Af Amer: 70 mL/min/{1.73_m2} (ref >59)
Glucose: 96 mg/dL (ref 65–99)
Potassium: 4 mmol/L (ref 3.5–5.2)
Sodium: 138 mmol/L (ref 134–144)

## 2019-07-09 LAB — MAGNESIUM: Magnesium: 1.7 mg/dL (ref 1.6–2.3)

## 2019-07-15 ENCOUNTER — Encounter: Payer: Self-pay | Admitting: Internal Medicine

## 2019-07-24 ENCOUNTER — Telehealth: Payer: Self-pay | Admitting: *Deleted

## 2019-07-24 MED ORDER — AMLODIPINE BESYLATE 5 MG PO TABS
5.0000 mg | ORAL_TABLET | Freq: Every day | ORAL | 3 refills | Status: DC
Start: 2019-07-24 — End: 2019-09-30

## 2019-07-24 NOTE — Telephone Encounter (Signed)
Received blood pressure log via mail from patient:  147/77 149/79 149/80 140/75 143/84 143/79  Per Dr. Rudell Cobb amlodipine 5 mg daily  Check BP daily for 2 weeks and call with readings.    Spoke to patient, aware of recommendations and verbalized understanding.   rx sent to pharmacy

## 2019-08-12 ENCOUNTER — Telehealth: Payer: Self-pay | Admitting: Cardiology

## 2019-08-12 NOTE — Telephone Encounter (Signed)
Patient called to return Alisha's call, transferred this call to [facility].

## 2019-08-12 NOTE — Telephone Encounter (Signed)
Patient states that Dr. Gardiner Rhyme had mentioned upgrading the dosage on losartan (COZAAR) 100 MG tablet, but wants to ask that since he isn't having an irregular heart rate anymore, if Dr. Gardiner Rhyme is ok with sticking with the current dosage or wants to go ahead and upgrade. Patient states he only has 2 pills left.

## 2019-08-12 NOTE — Telephone Encounter (Signed)
Left message to call back  

## 2019-08-12 NOTE — Telephone Encounter (Signed)
Pt instructed per chart review, MD recommend increased losartan to 100 mg daily due to BP. Pt also questioning if he should start amlodipine. Nurse advised that MD recommended to start amlodipine based on recent BP report. Pt verbalized understanding.

## 2019-09-15 ENCOUNTER — Other Ambulatory Visit: Payer: Self-pay | Admitting: Cardiology

## 2019-09-15 NOTE — Telephone Encounter (Signed)
This is Dr. Schumann's pt 

## 2019-09-24 ENCOUNTER — Other Ambulatory Visit: Payer: Self-pay | Admitting: Internal Medicine

## 2019-09-26 NOTE — Progress Notes (Signed)
Cardiology Office Note:    Date:  09/30/2019   ID:  Scott Lindsey, DOB [birth year], MRN [id number]  PCP:  [name], MD  Cardiologist:  No primary care provider on file.  Electrophysiologist:  None   Referring MD: [name], MD   Chief Complaint  Patient presents with   Irregular Heart Beat    History of Present Illness:    Scott Lindsey is a 64 y.o. male with a hx of prostate cancer, hyperlipidemia, hypertension who presents for follow-up.  He was referred by Dr. Ronnald Ramp for evaluation of PVCs.  Patient saw Dr. Ronnald Ramp for his annual physical and noted on EKG to have frequent PVCs.  He reports he has been asymptomatic.  Denies any palpitations, chest pain, dyspnea, lower extremity edema.  Labs 03/17/2019 notable for hypokalemia (3.4), hypomagnesemia (1.3).  He was started on magnesium supplements in addition to his potassium supplements.  At initial visit on 03/18/2019, it was recommended to discontinue indapamide, as suspected that was causing his electrolyte abnormalities.  He was switched to losartan 50 mg daily for hypertension.  Repeat labs on 03/24/2019 showed potassium 4.3, mag 1.6.  Echocardiogram on 04/28/2019 showed normal biventricular function, mild mitral regurgitation.  Zio patch x3 days on 04/30/2019 showed frequent PVCs (10.4% of beats).  He was started on Toprol-XL 25 mg daily.  Since last clinic visit, he reports that he has been doing well.  States that he goes for walks for 10 to 15 minutes with his grandson in [employer], children's.  He denies any chest pain, dyspnea, or palpitations.  States that he felt off balance when taking amlodipine, stopped taking and symptoms resolved.  He continues drink 2 bottles of vodka daily.  Has not been checking BP at home.  He continues to smoke 0.5 packs/day.   Past Medical History:  Diagnosis Date   Back pain     Past Surgical History:  Procedure Laterality Date   MULTIPLE TOOTH EXTRACTIONS     ROBOT ASSISTED LAPAROSCOPIC RADICAL  PROSTATECTOMY  12/13/2010   Procedure: ROBOTIC ASSISTED LAPAROSCOPIC RADICAL PROSTATECTOMY;  Surgeon: Bernestine Amass, MD;  Location: [facility];  Service: Urology;  Laterality: N/A;  with Bilateral Pelivic Lymph Node Dissection    Current Medications: Current Meds  Medication Sig   atorvastatin (LIPITOR) 20 MG tablet Take 1 tablet by mouth once daily   cloNIDine (CATAPRES) 0.1 MG tablet TAKE 1 TABLET BY MOUTH THREE TIMES DAILY   folic acid (FOLVITE) 1 MG tablet Take 1 tablet by mouth once daily   losartan (COZAAR) 100 MG tablet Take 1 tablet (100 mg total) by mouth daily.   magnesium oxide (MAG-OX) 400 (241.3 Mg) MG tablet Take 1 tablet by mouth once daily   metoprolol succinate (TOPROL-XL) 25 MG 24 hr tablet Take 1 tablet by mouth once daily   thiamine (VITAMIN B-1) 50 MG tablet Take 1 tablet (50 mg total) by mouth daily.     Allergies:   Patient has no known allergies.   Social History   Socioeconomic History   Marital status: Married    Spouse name: Not on file   Number of children: Not on file   Years of education: Not on file   Highest education level: Not on file  Occupational History   Not on file  Tobacco Use   Smoking status: Current Every Day Smoker    Packs/day: 0.50    Years: 25.00    Pack years: 12.50    Types: Cigarettes  Smokeless tobacco: Never Used  Substance and Sexual Activity   Alcohol use: Yes    Alcohol/week: 15.0 standard drinks    Types: 15 Shots of liquor per week    Comment: occasional   Drug use: No   Sexual activity: Yes    Partners: Female  Other Topics Concern   Not on file  Social History Narrative   Not on file   Social Determinants of Health   Financial Resource Strain:    Difficulty of Paying Living Expenses: Not on file  Food Insecurity:    Worried About Charity fundraiser in the Last Year: Not on file   [employer] of Food in the Last Year: Not on file  Transportation Needs:    Lack of Transportation  (Medical): Not on file   Lack of Transportation (Non-Medical): Not on file  Physical Activity:    Days of Exercise per Week: Not on file   Minutes of Exercise per Session: Not on file  Stress:    Feeling of Stress : Not on file  Social Connections:    Frequency of Communication with Friends and Family: Not on file   Frequency of Social Gatherings with Friends and Family: Not on file   Attends Religious Services: Not on file   Active Member of Clubs or Organizations: Not on file   Attends Archivist Meetings: Not on file   Marital Status: Not on file     Family History: The patient's family history includes Prostate cancer in his brother.  ROS:   Please see the history of present illness.     All other systems reviewed and are negative.  EKGs/Labs/Other Studies Reviewed:    The following studies were reviewed today:   EKG:  EKG is  ordered today.  The ekg ordered today demonstrates normal sinus rhythm, rate 65, no PVCs, poor R wave progression  Recent Labs: 03/17/2019: ALT 22; TSH 1.60 06/23/2019: Hemoglobin 13.0; Platelets 138.0 09/30/2019: BUN 13; Creatinine, Ser 1.22; Magnesium 1.7; Potassium 4.8; Sodium 140  Recent Lipid Panel    Component Value Date/Time   CHOL 145 03/17/2019 1002   TRIG 99.0 03/17/2019 1002   HDL 70.20 03/17/2019 1002   CHOLHDL 2 03/17/2019 1002   VLDL 19.8 03/17/2019 1002   LDLCALC 55 03/17/2019 1002    Physical Exam:    VS:  BP (!) 184/82    Pulse 65    Ht 5\' 8"  (1.727 m)    Wt 144 lb 3.2 oz (65.4 kg)    BMI 21.93 kg/m     Wt Readings from Last 3 Encounters:  09/30/19 144 lb 3.2 oz (65.4 kg)  07/01/19 144 lb 12.8 oz (65.7 kg)  06/23/19 146 lb (66.2 kg)     GEN: in no acute distress HEENT: Normal NECK: No JVD LYMPHATICS: No lymphadenopathy CARDIAC: Irregular, normal rate, no murmurs RESPIRATORY:  Clear to auscultation without rales, wheezing or rhonchi  ABDOMEN: Soft, non-tender, non-distended MUSCULOSKELETAL: Trace  edema SKIN: Warm and dry NEUROLOGIC:  Alert and oriented x 3 PSYCHIATRIC:  Normal affect   ASSESSMENT:    1. Frequent PVCs   2. Essential hypertension   3. Hypokalemia   4. Hypomagnesemia   5. Hyperlipidemia, unspecified hyperlipidemia type   6. Alcohol use   7. Tobacco use    PLAN:    Frequent PVCs: 10.4% of beats on Zio patch x3 days on 04/30/2019.  Suspect alcohol use is driving his PVCs, as he drinks vodka daily.  Suspect this  is also causing his hypomagnesemia, which is also likely contributing to his PVCs.  Echocardiogram on 04/28/2019 showed normal biventricular function, mild mitral regurgitation. -Continue Toprol-XL 25 mg daily -Recommend alcohol cessation.  He has a history of withdrawal (DTs).  Recommend he discuss plan to wean off alcohol with his PCP, as we will need to go slowly to avoid withdrawal -Plan repeat monitor to ensure improvement in PVCs once on metoprolol and off alcohol  Hypertension: On clonidine 0.1 mg three times daily, losartan 100 mg daily, and Toprol-XL 25 mg daily.  Elevated in clinic today, reports he did not take any of his medications this morning.  Recommended taking medications, monitoring BP daily for next week, and calling with results.  Hypokalemia/hypomagnesemia: Initially suspected due to indapamide use, which was likely contributing.  Also suspect alcohol use is playing significant role.  Recommend weaning off alcohol as above.  Hyperlipidemia: LDL 55.  On atorvastatin 20 mg daily  Alcohol use: history of history of withdrawal (DTs).  Recommend he discuss plan to wean off alcohol with his PCP, as we will need to go slowly to avoid withdrawal  Tobacco use: smokes 0.5 ppd.  Patient counseled on the risks of tobacco use and cessation strongly encouraged.  RTC in 3 month   Medication Adjustments/Labs and Tests Ordered: Current medicines are reviewed at length with the patient today.  Concerns regarding medicines are outlined above.  Orders  Placed This Encounter  Procedures   Magnesium   Basic metabolic panel   EKG 19-JYNW   No orders of the defined types were placed in this encounter.   Patient Instructions  Medication Instructions:  The current medical regimen is effective;  continue present plan and medications.  *If you need a refill on your cardiac medications before your next appointment, please call your pharmacy*   Lab Work: BMET, MAG today   If you have labs (blood work) drawn today and your tests are completely normal, you will receive your results only by:  [place] (if you have MyChart) OR  A paper copy in the mail If you have any lab test that is abnormal or we need to change your treatment, we will call you to review the results.   Follow-Up: At [facility], you and your health needs are our priority.  As part of our continuing mission to provide you with exceptional heart care, we have created designated Provider Care Teams.  These Care Teams include your primary Cardiologist (physician) and Advanced Practice Providers (APPs -  Physician Assistants and Nurse Practitioners) who all work together to provide you with the care you need, when you need it.  We recommend signing up for the patient portal called "MyChart".  Sign up information is provided on this After Visit Summary.  MyChart is used to connect with patients for Virtual Visits (Telemedicine).  Patients are able to view lab/test results, encounter notes, upcoming appointments, etc.  Non-urgent messages can be sent to your provider as well.   To learn more about what you can do with MyChart, go to [email or web address].    Your next appointment:   3 month(s)  The format for your next appointment:   In Person  Provider:   Oswaldo Milian, MD   Other Instructions Take your blood pressure daily and call us in 1 week to let us know how this is.  Talk to your primary care doctor regarding how to wean off the alcohol.       Signed, [name],  MD  09/30/2019 11:15 PM    [place]

## 2019-09-30 ENCOUNTER — Other Ambulatory Visit: Payer: Self-pay

## 2019-09-30 ENCOUNTER — Encounter: Payer: Self-pay | Admitting: Cardiology

## 2019-09-30 ENCOUNTER — Ambulatory Visit (INDEPENDENT_AMBULATORY_CARE_PROVIDER_SITE_OTHER): Payer: Medicare Other | Admitting: Cardiology

## 2019-09-30 VITALS — BP 184/82 | HR 65 | Ht 68.0 in | Wt 144.2 lb

## 2019-09-30 DIAGNOSIS — I493 Ventricular premature depolarization: Secondary | ICD-10-CM | POA: Diagnosis not present

## 2019-09-30 DIAGNOSIS — Z720 Tobacco use: Secondary | ICD-10-CM | POA: Diagnosis not present

## 2019-09-30 DIAGNOSIS — F1090 Alcohol use, unspecified, uncomplicated: Secondary | ICD-10-CM

## 2019-09-30 DIAGNOSIS — E785 Hyperlipidemia, unspecified: Secondary | ICD-10-CM

## 2019-09-30 DIAGNOSIS — Z7289 Other problems related to lifestyle: Secondary | ICD-10-CM

## 2019-09-30 DIAGNOSIS — I10 Essential (primary) hypertension: Secondary | ICD-10-CM | POA: Diagnosis not present

## 2019-09-30 DIAGNOSIS — Z789 Other specified health status: Secondary | ICD-10-CM

## 2019-09-30 DIAGNOSIS — E876 Hypokalemia: Secondary | ICD-10-CM | POA: Diagnosis not present

## 2019-09-30 LAB — BASIC METABOLIC PANEL
BUN/Creatinine Ratio: 11 (ref 10–24)
BUN: 13 mg/dL (ref 8–27)
CO2: 25 mmol/L (ref 20–29)
Calcium: 10 mg/dL (ref 8.6–10.2)
Chloride: 99 mmol/L (ref 96–106)
Creatinine, Ser: 1.22 mg/dL (ref 0.76–1.27)
GFR calc Af Amer: 71 mL/min/{1.73_m2} (ref >59)
GFR calc non Af Amer: 61 mL/min/{1.73_m2} (ref >59)
Glucose: 96 mg/dL (ref 65–99)
Potassium: 4.8 mmol/L (ref 3.5–5.2)
Sodium: 140 mmol/L (ref 134–144)

## 2019-09-30 LAB — MAGNESIUM: Magnesium: 1.7 mg/dL (ref 1.6–2.3)

## 2019-09-30 NOTE — Patient Instructions (Signed)
Medication Instructions:  The current medical regimen is effective;  continue present plan and medications.  *If you need a refill on your cardiac medications before your next appointment, please call your pharmacy*   Lab Work: BMET, MAG today   If you have labs (blood work) drawn today and your tests are completely normal, you will receive your results only by: Marland Kitchen MyChart Message (if you have MyChart) OR . A paper copy in the mail If you have any lab test that is abnormal or we need to change your treatment, we will call you to review the results.   Follow-Up: At [facility], you and your health needs are our priority.  As part of our continuing mission to provide you with exceptional heart care, we have created designated Provider Care Teams.  These Care Teams include your primary Cardiologist (physician) and Advanced Practice Providers (APPs -  Physician Assistants and Nurse Practitioners) who all work together to provide you with the care you need, when you need it.  We recommend signing up for the patient portal called "MyChart".  Sign up information is provided on this After Visit Summary.  MyChart is used to connect with patients for Virtual Visits (Telemedicine).  Patients are able to view lab/test results, encounter notes, upcoming appointments, etc.  Non-urgent messages can be sent to your provider as well.   To learn more about what you can do with MyChart, go to [email or web address].    Your next appointment:   3 month(s)  The format for your next appointment:   In Person  Provider:   Oswaldo Milian, MD   Other Instructions Take your blood pressure daily and call us in 1 week to let us know how this is.  Talk to your primary care doctor regarding how to wean off the alcohol.

## 2019-10-12 ENCOUNTER — Other Ambulatory Visit: Payer: Self-pay | Admitting: Internal Medicine

## 2019-10-12 DIAGNOSIS — D520 Dietary folate deficiency anemia: Secondary | ICD-10-CM

## 2019-11-02 ENCOUNTER — Other Ambulatory Visit: Payer: Self-pay | Admitting: Internal Medicine

## 2019-11-02 DIAGNOSIS — I10 Essential (primary) hypertension: Secondary | ICD-10-CM

## 2019-12-08 ENCOUNTER — Other Ambulatory Visit: Payer: Self-pay | Admitting: Internal Medicine

## 2019-12-08 DIAGNOSIS — E785 Hyperlipidemia, unspecified: Secondary | ICD-10-CM

## 2020-01-03 NOTE — Progress Notes (Signed)
Cardiology Office Note:    Date:  01/05/2020   ID:  Scott Lindsey, DOB [birth year], MRN [id number]  PCP:  [name], MD  Cardiologist:  No primary care provider on file.  Electrophysiologist:  None   Referring MD: [name], MD   Chief Complaint  Patient presents with  . Irregular Heart Beat    History of Present Illness:    Scott Lindsey is a 64 y.o. male with a hx of prostate cancer, hyperlipidemia, hypertension who presents for follow-up.  He was referred by Dr. Ronnald Ramp for evaluation of PVCs.  Patient saw Dr. Ronnald Ramp for his annual physical and noted on EKG to have frequent PVCs.  He reports he has been asymptomatic.  Denies any palpitations, chest pain, dyspnea, lower extremity edema.  Labs 03/17/2019 notable for hypokalemia (3.4), hypomagnesemia (1.3).  He was started on magnesium supplements in addition to his potassium supplements.  At initial visit on 03/18/2019, it was recommended to discontinue indapamide, as suspected that was causing his electrolyte abnormalities.  He was switched to losartan 50 mg daily for hypertension.  Repeat labs on 03/24/2019 showed potassium 4.3, mag 1.6.  Echocardiogram on 04/28/2019 showed normal biventricular function, mild mitral regurgitation.  Zio patch x3 days on 04/30/2019 showed frequent PVCs (10.4% of beats).  He was started on Toprol-XL 25 mg daily.  Since last clinic visit, he reports that he has been doing okay.  He has not been walking as much since the weather became colder.  He does walk his grandson around his home.  Continues to drink vodka and smokes 0.5 packs/day. Denies any chest pain, dyspnea, lightheadedness, syncope, lower extremity edema, or palpitations.    Past Medical History:  Diagnosis Date  . Back pain     Past Surgical History:  Procedure Laterality Date  . MULTIPLE TOOTH EXTRACTIONS    . ROBOT ASSISTED LAPAROSCOPIC RADICAL PROSTATECTOMY  12/13/2010   Procedure: ROBOTIC ASSISTED LAPAROSCOPIC RADICAL  PROSTATECTOMY;  Surgeon: Bernestine Amass, MD;  Location: [facility];  Service: Urology;  Laterality: N/A;  with Bilateral Pelivic Lymph Node Dissection    Current Medications: Current Meds  Medication Sig  . atorvastatin (LIPITOR) 20 MG tablet Take 1 tablet by mouth once daily  . cloNIDine (CATAPRES) 0.1 MG tablet TAKE 1 TABLET BY MOUTH THREE TIMES DAILY  . folic acid (FOLVITE) 1 MG tablet Take 1 tablet by mouth once daily  . losartan (COZAAR) 100 MG tablet Take 1 tablet (100 mg total) by mouth daily.  . magnesium oxide (MAG-OX) 400 (241.3 Mg) MG tablet Take 1 tablet by mouth once daily  . metoprolol succinate (TOPROL-XL) 25 MG 24 hr tablet Take 1 tablet by mouth once daily  . thiamine (VITAMIN B-1) 50 MG tablet Take 1 tablet (50 mg total) by mouth daily.     Allergies:   Patient has no known allergies.   Social History   Socioeconomic History  . Marital status: Married    Spouse name: Not on file  . Number of children: Not on file  . Years of education: Not on file  . Highest education level: Not on file  Occupational History  . Not on file  Tobacco Use  . Smoking status: Current Every Day Smoker    Packs/day: 0.50    Years: 25.00    Pack years: 12.50    Types: Cigarettes  . Smokeless tobacco: Never Used  Substance and Sexual Activity  . Alcohol use: Yes    Alcohol/week: 15.0 standard  drinks    Types: 15 Shots of liquor per week    Comment: occasional  . Drug use: No  . Sexual activity: Yes    Partners: Female  Other Topics Concern  . Not on file  Social History Narrative  . Not on file   Social Determinants of Health   Financial Resource Strain: Not on file  Food Insecurity: Not on file  Transportation Needs: Not on file  Physical Activity: Not on file  Stress: Not on file  Social Connections: Not on file     Family History: The patient's family history includes Prostate cancer in his brother.  ROS:   Please see the history of present illness.     All other  systems reviewed and are negative.  EKGs/Labs/Other Studies Reviewed:    The following studies were reviewed today:   EKG:  EKG is  ordered today.  The ekg ordered today demonstrates normal sinus rhythm, rate 61, no PVCs  Recent Labs: 03/17/2019: ALT 22; TSH 1.60 06/23/2019: Hemoglobin 13.0; Platelets 138.0 09/30/2019: BUN 13; Creatinine, Ser 1.22; Magnesium 1.7; Potassium 4.8; Sodium 140  Recent Lipid Panel    Component Value Date/Time   CHOL 145 03/17/2019 1002   TRIG 99.0 03/17/2019 1002   HDL 70.20 03/17/2019 1002   CHOLHDL 2 03/17/2019 1002   VLDL 19.8 03/17/2019 1002   LDLCALC 55 03/17/2019 1002    Physical Exam:    VS:  BP (!) 170/80   Pulse 61   Ht 5\' 8"  (1.727 m)   Wt 147 lb 9.6 oz (67 kg)   BMI 22.44 kg/m     Wt Readings from Last 3 Encounters:  01/05/20 147 lb 9.6 oz (67 kg)  09/30/19 144 lb 3.2 oz (65.4 kg)  07/01/19 144 lb 12.8 oz (65.7 kg)     GEN: in no acute distress HEENT: Normal NECK: No JVD LYMPHATICS: No lymphadenopathy CARDIAC: Irregular, normal rate, no murmurs RESPIRATORY:  Clear to auscultation without rales, wheezing or rhonchi  ABDOMEN: Soft, non-tender, non-distended MUSCULOSKELETAL: Trace edema SKIN: Warm and dry NEUROLOGIC:  Alert and oriented x 3 PSYCHIATRIC:  Normal affect   ASSESSMENT:    1. Frequent PVCs   2. Essential hypertension   3. Hypokalemia   4. Hypomagnesemia   5. Hyperlipidemia, unspecified hyperlipidemia type   6. Alcohol use   7. Tobacco use    PLAN:    Frequent PVCs: 10.4% of beats on Zio patch x3 days on 04/30/2019.  Suspect alcohol use is driving his PVCs, as he drinks vodka daily.  Suspect this is also causing his hypomagnesemia, which is also likely contributing to his PVCs.  Echocardiogram on 04/28/2019 showed normal biventricular function, mild mitral regurgitation. -Continue Toprol-XL 25 mg daily -Recommend alcohol cessation.  He has a history of withdrawal (DTs).  Recommend he discuss plan to wean off  alcohol with his PCP, as we will need to go slowly to avoid withdrawal -Will repeat Zio patch x 3 days to evaluate for improvement in PVCs  Hypertension: On clonidine 0.1 mg three times daily, losartan 100 mg daily, and Toprol-XL 25 mg daily.  Elevated in clinic today, reports he has been eating ham since Christmas and also had sausage this morning.  Counseled on avoiding salty foods.  Asked to check BP twice daily for next week and call with results  Hypokalemia/hypomagnesemia: Initially suspected due to indapamide use, which was likely contributing.  Also suspect alcohol use is playing significant role.  Recommend weaning off alcohol as above.  Hyperlipidemia: LDL 55.  On atorvastatin 20 mg daily  Alcohol use: history of history of withdrawal (DTs).  Recommend he discuss plan to wean off alcohol with his PCP, as we will need to go slowly to avoid withdrawal  Tobacco use: smokes 0.5 ppd.  Patient counseled on the risks of tobacco use and cessation strongly encouraged.  Will ask care guide to work with patient to assist with smoking cessation  RTC in 3 months   Medication Adjustments/Labs and Tests Ordered: Current medicines are reviewed at length with the patient today.  Concerns regarding medicines are outlined above.  Orders Placed This Encounter  Procedures  . LONG TERM MONITOR (3-14 DAYS)  . EKG 12-Lead   No orders of the defined types were placed in this encounter.   Patient Instructions  Medication Instructions:  Your physician recommends that you continue on your current medications as directed. Please refer to the Current Medication list given to you today.  *If you need a refill on your cardiac medications before your next appointment, please call your pharmacy*  Testing/Procedures:  [place] Monitor Instructions   Your physician has requested you wear your ZIO patch monitor 3 days.   This is a single patch monitor.  [employer] supplies one patch monitor per  enrollment.  Additional stickers are not available.   Please do not apply patch if you will be having a Nuclear Stress Test, Echocardiogram, Cardiac CT, MRI, or Chest Xray during the time frame you would be wearing the monitor. The patch cannot be worn during these tests.  You cannot remove and re-apply the ZIO XT patch monitor.   Your ZIO patch monitor will be sent USPS Priority mail from [facility] directly to your home address. The monitor may also be mailed to a PO BOX if home delivery is not available.   It may take 3-5 days to receive your monitor after you have been enrolled.   Once you have received you monitor, please review enclosed instructions.  Your monitor has already been registered assigning a specific monitor serial # to you.   Applying the monitor   Shave hair from upper left chest.   Hold abrader disc by orange tab.  Rub abrader in 40 strokes over left upper chest as indicated in your monitor instructions.   Clean area with 4 enclosed alcohol pads .  Use all pads to assure are is cleaned thoroughly.  Let dry.   Apply patch as indicated in monitor instructions.  Patch will be place under collarbone on left side of chest with arrow pointing upward.   Rub patch adhesive wings for 2 minutes.Remove white label marked "1".  Remove white label marked "2".  Rub patch adhesive wings for 2 additional minutes.   While looking in a mirror, press and release button in center of patch.  A small green light will flash 3-4 times .  This will be your only indicator the monitor has been turned on.     Do not shower for the first 24 hours.  You may shower after the first 24 hours.   Press button if you feel a symptom. You will hear a small click.  Record Date, Time and Symptom in the Patient Log Book.   When you are ready to remove patch, follow instructions on last 2 pages of Patient Log Book.  Stick patch monitor onto last page of Patient Log Book.   Place Patient Log Book in  [place] box.  Use locking  tab on box and tape box closed securely.  The [employer] and [employer] has [employer] on it.  Please place in mailbox as soon as possible.  Your physician should have your test results approximately 7 days after the monitor has been mailed back to [facility].   Call [facility] at [phone] if you have questions regarding your ZIO XT patch monitor.  Call them immediately if you see an orange light blinking on your monitor.   If your monitor falls off in less than 4 days contact our Monitor department at [phone].  If your monitor becomes loose or falls off after 4 days call [employer] at [phone] for suggestions on securing your monitor.   Follow-Up: At [facility], you and your health needs are our priority.  As part of our continuing mission to provide you with exceptional heart care, we have created designated Provider Care Teams.  These Care Teams include your primary Cardiologist (physician) and Advanced Practice Providers (APPs -  Physician Assistants and Nurse Practitioners) who all work together to provide you with the care you need, when you need it.  We recommend signing up for the patient portal called "MyChart".  Sign up information is provided on this After Visit Summary.  MyChart is used to connect with patients for Virtual Visits (Telemedicine).  Patients are able to view lab/test results, encounter notes, upcoming appointments, etc.  Non-urgent messages can be sent to your provider as well.   To learn more about what you can do with MyChart, go to [email or web address].    Your next appointment:   3 month(s)  The format for your next appointment:   In Person  Provider:   Oswaldo Milian, MD   Other Instructions Please check your blood pressure at home daily, write it down.  Call the office or send message via Mychart with the readings in 2 weeks for Dr. Gardiner Rhyme to review.       Signed, [name], MD  01/05/2020 11:55 PM    [place]

## 2020-01-05 ENCOUNTER — Ambulatory Visit (INDEPENDENT_AMBULATORY_CARE_PROVIDER_SITE_OTHER): Payer: Medicare Other | Admitting: Cardiology

## 2020-01-05 ENCOUNTER — Encounter: Payer: Self-pay | Admitting: Cardiology

## 2020-01-05 ENCOUNTER — Ambulatory Visit (INDEPENDENT_AMBULATORY_CARE_PROVIDER_SITE_OTHER): Payer: Medicare Other

## 2020-01-05 ENCOUNTER — Other Ambulatory Visit: Payer: Self-pay

## 2020-01-05 ENCOUNTER — Encounter: Payer: Self-pay | Admitting: *Deleted

## 2020-01-05 VITALS — BP 170/80 | HR 61 | Ht 68.0 in | Wt 147.6 lb

## 2020-01-05 DIAGNOSIS — E785 Hyperlipidemia, unspecified: Secondary | ICD-10-CM

## 2020-01-05 DIAGNOSIS — I493 Ventricular premature depolarization: Secondary | ICD-10-CM

## 2020-01-05 DIAGNOSIS — Z7289 Other problems related to lifestyle: Secondary | ICD-10-CM | POA: Diagnosis not present

## 2020-01-05 DIAGNOSIS — E876 Hypokalemia: Secondary | ICD-10-CM | POA: Diagnosis not present

## 2020-01-05 DIAGNOSIS — Z789 Other specified health status: Secondary | ICD-10-CM

## 2020-01-05 DIAGNOSIS — I10 Essential (primary) hypertension: Secondary | ICD-10-CM | POA: Diagnosis not present

## 2020-01-05 DIAGNOSIS — Z720 Tobacco use: Secondary | ICD-10-CM

## 2020-01-05 NOTE — Patient Instructions (Signed)
Medication Instructions:  Your physician recommends that you continue on your current medications as directed. Please refer to the Current Medication list given to you today.  *If you need a refill on your cardiac medications before your next appointment, please call your pharmacy*  Testing/Procedures:  [place] Monitor Instructions   Your physician has requested you wear your ZIO patch monitor 3 days.   This is a single patch monitor.  [employer] supplies one patch monitor per enrollment.  Additional stickers are not available.   Please do not apply patch if you will be having a Nuclear Stress Test, Echocardiogram, Cardiac CT, MRI, or Chest Xray during the time frame you would be wearing the monitor. The patch cannot be worn during these tests.  You cannot remove and re-apply the ZIO XT patch monitor.   Your ZIO patch monitor will be sent USPS Priority mail from [facility] directly to your home address. The monitor may also be mailed to a PO BOX if home delivery is not available.   It may take 3-5 days to receive your monitor after you have been enrolled.   Once you have received you monitor, please review enclosed instructions.  Your monitor has already been registered assigning a specific monitor serial # to you.   Applying the monitor   Shave hair from upper left chest.   Hold abrader disc by orange tab.  Rub abrader in 40 strokes over left upper chest as indicated in your monitor instructions.   Clean area with 4 enclosed alcohol pads .  Use all pads to assure are is cleaned thoroughly.  Let dry.   Apply patch as indicated in monitor instructions.  Patch will be place under collarbone on left side of chest with arrow pointing upward.   Rub patch adhesive wings for 2 minutes.Remove white label marked "1".  Remove white label marked "2".  Rub patch adhesive wings for 2 additional minutes.   While looking in a mirror, press and release button in center of patch.  A  small green light will flash 3-4 times .  This will be your only indicator the monitor has been turned on.     Do not shower for the first 24 hours.  You may shower after the first 24 hours.   Press button if you feel a symptom. You will hear a small click.  Record Date, Time and Symptom in the Patient Log Book.   When you are ready to remove patch, follow instructions on last 2 pages of Patient Log Book.  Stick patch monitor onto last page of Patient Log Book.   Place Patient Log Book in [place] box.  Use locking tab on box and tape box closed securely.  The [employer] has [employer] on it.  Please place in mailbox as soon as possible.  Your physician should have your test results approximately 7 days after the monitor has been mailed back to [facility].   Call [facility] at [phone] if you have questions regarding your ZIO XT patch monitor.  Call them immediately if you see an orange light blinking on your monitor.   If your monitor falls off in less than 4 days contact our Monitor department at [phone].  If your monitor becomes loose or falls off after 4 days call [employer] at [phone] for suggestions on securing your monitor.   Follow-Up: At [facility], you and your health needs are our priority.  As part of  our continuing mission to provide you with exceptional heart care, we have created designated Provider Care Teams.  These Care Teams include your primary Cardiologist (physician) and Advanced Practice Providers (APPs -  Physician Assistants and Nurse Practitioners) who all work together to provide you with the care you need, when you need it.  We recommend signing up for the patient portal called "MyChart".  Sign up information is provided on this After Visit Summary.  MyChart is used to connect with patients for Virtual Visits (Telemedicine).  Patients are able to view lab/test results, encounter notes, upcoming appointments, etc.   Non-urgent messages can be sent to your provider as well.   To learn more about what you can do with MyChart, go to [email or web address].    Your next appointment:   3 month(s)  The format for your next appointment:   In Person  Provider:   Epifanio Lesches, MD   Other Instructions Please check your blood pressure at home daily, write it down.  Call the office or send message via Mychart with the readings in 2 weeks for Dr. Bjorn Pippin to review.

## 2020-01-05 NOTE — Progress Notes (Signed)
Patient ID: Scott Lindsey, male   DOB: 18-Sep-1955, 64 y.o.   MRN: [id number] Patient enrolled for [employer] to ship a 3 day ZIO XT long term holter monitor to his home.

## 2020-01-11 ENCOUNTER — Other Ambulatory Visit: Payer: Self-pay | Admitting: Internal Medicine

## 2020-01-11 DIAGNOSIS — I493 Ventricular premature depolarization: Secondary | ICD-10-CM | POA: Diagnosis not present

## 2020-01-11 DIAGNOSIS — D520 Dietary folate deficiency anemia: Secondary | ICD-10-CM

## 2020-01-12 ENCOUNTER — Telehealth: Payer: Self-pay

## 2020-01-12 DIAGNOSIS — Z0000 Encounter for general adult medical examination without abnormal findings: Secondary | ICD-10-CM

## 2020-01-12 NOTE — Telephone Encounter (Signed)
Called patient to discuss health coaching for smoking cessation and possible referral for alcohol cessation. Left message for patient to call Care Guide/Health Coach back at [phone].

## 2020-01-21 DIAGNOSIS — I493 Ventricular premature depolarization: Secondary | ICD-10-CM | POA: Diagnosis not present

## 2020-03-20 ENCOUNTER — Other Ambulatory Visit: Payer: Self-pay | Admitting: Internal Medicine

## 2020-03-20 DIAGNOSIS — I10 Essential (primary) hypertension: Secondary | ICD-10-CM

## 2020-04-03 NOTE — Progress Notes (Signed)
Cardiology Office Note:    Date:  04/04/2020   ID:  Scott Lindsey, DOB [birth year], MRN [id number]  PCP:  [name], MD  Cardiologist:  No primary care provider on file.  Electrophysiologist:  None   Referring MD: [name], MD   Chief Complaint  Patient presents with  . Irregular Heart Beat    History of Present Illness:    Scott Lindsey is a 65 y.o. male with a hx of prostate cancer, hyperlipidemia, hypertension who presents for follow-up.  He was referred by Dr. Ronnald Ramp for evaluation of PVCs.  Patient saw Dr. Ronnald Ramp for his annual physical and noted on EKG to have frequent PVCs.  He reports he has been asymptomatic.  Denies any palpitations, chest pain, dyspnea, lower extremity edema.  Labs 03/17/2019 notable for hypokalemia (3.4), hypomagnesemia (1.3).  He was started on magnesium supplements in addition to his potassium supplements.  At initial visit on 03/18/2019, it was recommended to discontinue indapamide, as suspected that was causing his electrolyte abnormalities.  He was switched to losartan 50 mg daily for hypertension.  Repeat labs on 03/24/2019 showed potassium 4.3, mag 1.6.  Echocardiogram on 04/28/2019 showed normal biventricular function, mild mitral regurgitation.  Zio patch x3 days on 04/30/2019 showed frequent PVCs (10.4% of beats).  He was started on Toprol-XL 25 mg daily.  Repeat Zio patch x3 days on 01/21/2020 showed PVC burden less than 1%.  Since last clinic visit, he reports that he has been doing well.  Denies any chest pain, dyspnea, lightheadedness, syncope, lower extremity edema, or palpitations.  Smoking 6-7 cigarretes per day.  Continues to drink vodka daily.  BP elevated in clinic today, reports he did not take his meds.    Past Medical History:  Diagnosis Date  . Back pain     Past Surgical History:  Procedure Laterality Date  . MULTIPLE TOOTH EXTRACTIONS    . ROBOT ASSISTED LAPAROSCOPIC RADICAL PROSTATECTOMY  12/13/2010   Procedure: ROBOTIC  ASSISTED LAPAROSCOPIC RADICAL PROSTATECTOMY;  Surgeon: Bernestine Amass, MD;  Location: [facility];  Service: Urology;  Laterality: N/A;  with Bilateral Pelivic Lymph Node Dissection    Current Medications: Current Meds  Medication Sig  . atorvastatin (LIPITOR) 20 MG tablet Take 1 tablet by mouth once daily  . cloNIDine (CATAPRES) 0.1 MG tablet TAKE 1 TABLET BY MOUTH THREE TIMES DAILY  . folic acid (FOLVITE) 1 MG tablet Take 1 tablet by mouth once daily  . magnesium oxide (MAG-OX) 400 (241.3 Mg) MG tablet Take 1 tablet by mouth once daily  . metoprolol succinate (TOPROL-XL) 25 MG 24 hr tablet Take 1 tablet by mouth once daily  . thiamine (VITAMIN B-1) 50 MG tablet Take 1 tablet (50 mg total) by mouth daily.     Allergies:   Patient has no known allergies.   Social History   Socioeconomic History  . Marital status: Married    Spouse name: Not on file  . Number of children: Not on file  . Years of education: Not on file  . Highest education level: Not on file  Occupational History  . Not on file  Tobacco Use  . Smoking status: Current Every Day Smoker    Packs/day: 0.50    Years: 25.00    Pack years: 12.50    Types: Cigarettes  . Smokeless tobacco: Never Used  Substance and Sexual Activity  . Alcohol use: Yes    Alcohol/week: 15.0 standard drinks    Types: 15 Shots  of liquor per week    Comment: occasional  . Drug use: No  . Sexual activity: Yes    Partners: Female  Other Topics Concern  . Not on file  Social History Narrative  . Not on file   Social Determinants of Health   Financial Resource Strain: Not on file  Food Insecurity: Not on file  Transportation Needs: Not on file  Physical Activity: Not on file  Stress: Not on file  Social Connections: Not on file     Family History: The patient's family history includes Prostate cancer in his brother.  ROS:   Please see the history of present illness.     All other systems reviewed and are  negative.  EKGs/Labs/Other Studies Reviewed:    The following studies were reviewed today:   EKG:  EKG is  ordered today.  The ekg ordered today demonstrates normal sinus rhythm, rate 60, no PVCs  Recent Labs: 06/23/2019: Hemoglobin 13.0; Platelets 138.0 09/30/2019: BUN 13; Creatinine, Ser 1.22; Magnesium 1.7; Potassium 4.8; Sodium 140  Recent Lipid Panel    Component Value Date/Time   CHOL 145 03/17/2019 1002   TRIG 99.0 03/17/2019 1002   HDL 70.20 03/17/2019 1002   CHOLHDL 2 03/17/2019 1002   VLDL 19.8 03/17/2019 1002   LDLCALC 55 03/17/2019 1002    Physical Exam:    VS:  BP (!) 168/90   Pulse 60   Ht 5\' 8"  (1.727 m)   Wt 146 lb 9.6 oz (66.5 kg)   BMI 22.29 kg/m     Wt Readings from Last 3 Encounters:  04/04/20 146 lb 9.6 oz (66.5 kg)  01/05/20 147 lb 9.6 oz (67 kg)  09/30/19 144 lb 3.2 oz (65.4 kg)     GEN: in no acute distress HEENT: Normal NECK: No JVD LYMPHATICS: No lymphadenopathy CARDIAC: Irregular, normal rate, no murmurs RESPIRATORY:  Clear to auscultation without rales, wheezing or rhonchi  ABDOMEN: Soft, non-tender, non-distended MUSCULOSKELETAL: Trace edema SKIN: Warm and dry NEUROLOGIC:  Alert and oriented x 3 PSYCHIATRIC:  Normal affect   ASSESSMENT:    1. Frequent PVCs   2. Essential hypertension   3. Medication management   4. Hyperlipidemia, unspecified hyperlipidemia type    PLAN:    Frequent PVCs: 10.4% of beats on Zio patch x3 days on 04/30/2019.  Suspect alcohol use is driving his PVCs, as he drinks vodka daily.  Suspect this is also causing his hypomagnesemia, which is also likely contributing to his PVCs.  Echocardiogram on 04/28/2019 showed normal biventricular function, mild mitral regurgitation.  Repeat Zio patch x3 days on 01/21/2020 showed PVC burden less than 1%. -Continue Toprol-XL 25 mg daily -Recommend alcohol cessation.  He has a history of withdrawal (DTs).  Recommend he discuss plan to wean off alcohol with his PCP, as we  will need to go slowly to avoid withdrawal  Hypertension: On clonidine 0.1 mg three times daily, losartan 100 mg daily, and Toprol-XL 25 mg daily.  BP elevated in clinic today, reports he did not take his meds.  Also reports high salt intake.  Advised on low-salt diet, asked him to check BP daily for next week and call with results.    Hypokalemia/hypomagnesemia: Initially suspected due to indapamide use, which was likely contributing.  Also suspect alcohol use is playing significant role.  Recommend weaning off alcohol as above.  Will check BMP/magnesium  Hyperlipidemia: LDL 55 on 03/17/2019.  On atorvastatin 20 mg daily.  Will check lipid panel  Alcohol use:  history of history of withdrawal (DTs).  Recommend he discuss plan to wean off alcohol with his PCP, as we will need to go slowly to avoid withdrawal  Tobacco use: smokes 0.5 ppd.  Patient counseled on the risks of tobacco use and cessation strongly encouraged.  Will ask care guide to work with patient to assist with smoking cessation  RTC in 6 months  Medication Adjustments/Labs and Tests Ordered: Current medicines are reviewed at length with the patient today.  Concerns regarding medicines are outlined above.  Orders Placed This Encounter  Procedures  . Basic metabolic panel  . Magnesium  . Lipid panel  . EKG 12-Lead   No orders of the defined types were placed in this encounter.   Patient Instructions  Medication Instructions:  Your physician recommends that you continue on your current medications as directed. Please refer to the Current Medication list given to you today.  *If you need a refill on your cardiac medications before your next appointment, please call your pharmacy*   Lab Work: BMET, Mag, Lipid today  If you have labs (blood work) drawn today and your tests are completely normal, you will receive your results only by: Marland Kitchen MyChart Message (if you have MyChart) OR . A paper copy in the mail If you have any lab  test that is abnormal or we need to change your treatment, we will call you to review the results.  Follow-Up: At [facility], you and your health needs are our priority.  As part of our continuing mission to provide you with exceptional heart care, we have created designated Provider Care Teams.  These Care Teams include your primary Cardiologist (physician) and Advanced Practice Providers (APPs -  Physician Assistants and Nurse Practitioners) who all work together to provide you with the care you need, when you need it.  We recommend signing up for the patient portal called "MyChart".  Sign up information is provided on this After Visit Summary.  MyChart is used to connect with patients for Virtual Visits (Telemedicine).  Patients are able to view lab/test results, encounter notes, upcoming appointments, etc.  Non-urgent messages can be sent to your provider as well.   To learn more about what you can do with MyChart, go to [email or web address].    Your next appointment:   6 month(s)  The format for your next appointment:   In Person  Provider:   Oswaldo Milian, MD   Other Instructions Please check your blood pressure at home daily, write it down.  Call the office or send message via Mychart with the readings in 1 week for Dr. Gardiner Rhyme to review.    Call Amy: Care Guide/Health Coach back at [phone].     Signed, [name], MD  04/04/2020 12:34 PM    [place]

## 2020-04-04 ENCOUNTER — Other Ambulatory Visit: Payer: Self-pay

## 2020-04-04 ENCOUNTER — Ambulatory Visit (INDEPENDENT_AMBULATORY_CARE_PROVIDER_SITE_OTHER): Payer: Medicare Other | Admitting: Cardiology

## 2020-04-04 ENCOUNTER — Encounter: Payer: Self-pay | Admitting: Cardiology

## 2020-04-04 VITALS — BP 168/90 | HR 60 | Ht 68.0 in | Wt 146.6 lb

## 2020-04-04 DIAGNOSIS — I10 Essential (primary) hypertension: Secondary | ICD-10-CM

## 2020-04-04 DIAGNOSIS — E785 Hyperlipidemia, unspecified: Secondary | ICD-10-CM

## 2020-04-04 DIAGNOSIS — Z79899 Other long term (current) drug therapy: Secondary | ICD-10-CM

## 2020-04-04 DIAGNOSIS — I493 Ventricular premature depolarization: Secondary | ICD-10-CM

## 2020-04-04 LAB — MAGNESIUM: Magnesium: 1.5 mg/dL — ABNORMAL LOW (ref 1.6–2.3)

## 2020-04-04 LAB — LIPID PANEL
Chol/HDL Ratio: 1.7 ratio (ref 0.0–5.0)
Cholesterol, Total: 152 mg/dL (ref 100–199)
HDL: 91 mg/dL (ref >39)
LDL Chol Calc (NIH): 49 mg/dL (ref 0–99)
Triglycerides: 55 mg/dL (ref 0–149)
VLDL Cholesterol Cal: 12 mg/dL (ref 5–40)

## 2020-04-04 LAB — BASIC METABOLIC PANEL
BUN/Creatinine Ratio: 10 (ref 10–24)
BUN: 11 mg/dL (ref 8–27)
CO2: 22 mmol/L (ref 20–29)
Calcium: 9.7 mg/dL (ref 8.6–10.2)
Chloride: 100 mmol/L (ref 96–106)
Creatinine, Ser: 1.11 mg/dL (ref 0.76–1.27)
Glucose: 116 mg/dL — ABNORMAL HIGH (ref 65–99)
Potassium: 4.3 mmol/L (ref 3.5–5.2)
Sodium: 140 mmol/L (ref 134–144)
eGFR: 73 mL/min/{1.73_m2} (ref >59)

## 2020-04-04 NOTE — Patient Instructions (Addendum)
Medication Instructions:  Your physician recommends that you continue on your current medications as directed. Please refer to the Current Medication list given to you today.  *If you need a refill on your cardiac medications before your next appointment, please call your pharmacy*   Lab Work: BMET, Mag, Lipid today  If you have labs (blood work) drawn today and your tests are completely normal, you will receive your results only by: Marland Kitchen MyChart Message (if you have MyChart) OR . A paper copy in the mail If you have any lab test that is abnormal or we need to change your treatment, we will call you to review the results.  Follow-Up: At [facility], you and your health needs are our priority.  As part of our continuing mission to provide you with exceptional heart care, we have created designated Provider Care Teams.  These Care Teams include your primary Cardiologist (physician) and Advanced Practice Providers (APPs -  Physician Assistants and Nurse Practitioners) who all work together to provide you with the care you need, when you need it.  We recommend signing up for the patient portal called "MyChart".  Sign up information is provided on this After Visit Summary.  MyChart is used to connect with patients for Virtual Visits (Telemedicine).  Patients are able to view lab/test results, encounter notes, upcoming appointments, etc.  Non-urgent messages can be sent to your provider as well.   To learn more about what you can do with MyChart, go to [email or web address].    Your next appointment:   6 month(s)  The format for your next appointment:   In Person  Provider:   Oswaldo Milian, MD   Other Instructions Please check your blood pressure at home daily, write it down.  Call the office or send message via Mychart with the readings in 1 week for Dr. Gardiner Rhyme to review.    Call Amy: Care Guide/Health Coach back at [phone].

## 2020-04-05 ENCOUNTER — Telehealth: Payer: Self-pay

## 2020-04-05 ENCOUNTER — Other Ambulatory Visit: Payer: Self-pay | Admitting: *Deleted

## 2020-04-05 DIAGNOSIS — Z79899 Other long term (current) drug therapy: Secondary | ICD-10-CM

## 2020-04-05 DIAGNOSIS — I493 Ventricular premature depolarization: Secondary | ICD-10-CM

## 2020-04-05 DIAGNOSIS — Z0000 Encounter for general adult medical examination without abnormal findings: Secondary | ICD-10-CM

## 2020-04-05 MED ORDER — MAGNESIUM OXIDE 400 (241.3 MG) MG PO TABS: 1.0000 | ORAL_TABLET | Freq: Two times a day (BID) | ORAL | 3 refills | Status: DC

## 2020-04-05 NOTE — Telephone Encounter (Signed)
Patient called in per Dr. Gardiner Rhyme recommendation for smoking cessation. Patient is interested in health coaching to quit smoking. Patient has been scheduled for initial in-person health coaching session on April 13th at 11:30am.

## 2020-04-13 ENCOUNTER — Other Ambulatory Visit: Payer: Self-pay | Admitting: Internal Medicine

## 2020-04-13 DIAGNOSIS — D520 Dietary folate deficiency anemia: Secondary | ICD-10-CM

## 2020-04-15 ENCOUNTER — Telehealth: Payer: Self-pay | Admitting: Cardiology

## 2020-04-15 NOTE — Telephone Encounter (Signed)
Pt called to give 5 BP readings as requested by Dr. Gardiner Rhyme since last visit on 04/04/2020  04/04/2020 133/78  04/05/2020 119/73  04/06/2020 134/71  04/07/2020 138/79  04/10/2020 136/73

## 2020-04-18 NOTE — Telephone Encounter (Signed)
BP looks good, no changes 

## 2020-04-20 ENCOUNTER — Ambulatory Visit: Payer: Medicare Other

## 2020-04-20 ENCOUNTER — Telehealth: Payer: Self-pay

## 2020-04-20 DIAGNOSIS — Z0000 Encounter for general adult medical examination without abnormal findings: Secondary | ICD-10-CM

## 2020-04-20 NOTE — Telephone Encounter (Signed)
Called patient to inquire about rescheduling his health coaching appointment from today. Left patient a message to call Care Guide back at [phone].

## 2020-04-26 ENCOUNTER — Encounter: Payer: Self-pay | Admitting: *Deleted

## 2020-04-28 NOTE — Telephone Encounter (Signed)
See mychart message  Reminder sent about repeat labs needed.

## 2020-05-02 ENCOUNTER — Other Ambulatory Visit: Payer: Self-pay

## 2020-05-02 DIAGNOSIS — I493 Ventricular premature depolarization: Secondary | ICD-10-CM

## 2020-05-02 DIAGNOSIS — Z79899 Other long term (current) drug therapy: Secondary | ICD-10-CM

## 2020-05-02 LAB — BASIC METABOLIC PANEL
BUN/Creatinine Ratio: 11 (ref 10–24)
BUN: 14 mg/dL (ref 8–27)
CO2: 23 mmol/L (ref 20–29)
Calcium: 9.8 mg/dL (ref 8.6–10.2)
Chloride: 98 mmol/L (ref 96–106)
Creatinine, Ser: 1.31 mg/dL — ABNORMAL HIGH (ref 0.76–1.27)
Glucose: 140 mg/dL — ABNORMAL HIGH (ref 65–99)
Potassium: 4.6 mmol/L (ref 3.5–5.2)
Sodium: 138 mmol/L (ref 134–144)
eGFR: 60 mL/min/{1.73_m2} (ref >59)

## 2020-05-02 LAB — MAGNESIUM: Magnesium: 1.9 mg/dL (ref 1.6–2.3)

## 2020-06-26 ENCOUNTER — Emergency Department (HOSPITAL_COMMUNITY): Payer: Medicare Other

## 2020-06-26 ENCOUNTER — Other Ambulatory Visit: Payer: Self-pay

## 2020-06-26 ENCOUNTER — Encounter (HOSPITAL_COMMUNITY): Payer: Self-pay

## 2020-06-26 ENCOUNTER — Emergency Department (HOSPITAL_COMMUNITY)
Admission: EM | Admit: 2020-06-26 | Discharge: 2020-06-26 | Disposition: A | Discharge status: Home / Self Care | Payer: Medicare Other | Attending: Emergency Medicine | Admitting: Emergency Medicine

## 2020-06-26 DIAGNOSIS — Z8546 Personal history of malignant neoplasm of prostate: Secondary | ICD-10-CM | POA: Insufficient documentation

## 2020-06-26 DIAGNOSIS — R112 Nausea with vomiting, unspecified: Secondary | ICD-10-CM | POA: Diagnosis not present

## 2020-06-26 DIAGNOSIS — F0390 Unspecified dementia without behavioral disturbance: Secondary | ICD-10-CM | POA: Insufficient documentation

## 2020-06-26 DIAGNOSIS — M79605 Pain in left leg: Secondary | ICD-10-CM | POA: Insufficient documentation

## 2020-06-26 DIAGNOSIS — R1013 Epigastric pain: Secondary | ICD-10-CM | POA: Diagnosis not present

## 2020-06-26 DIAGNOSIS — R202 Paresthesia of skin: Secondary | ICD-10-CM | POA: Insufficient documentation

## 2020-06-26 DIAGNOSIS — F17210 Nicotine dependence, cigarettes, uncomplicated: Secondary | ICD-10-CM | POA: Insufficient documentation

## 2020-06-26 DIAGNOSIS — Z79899 Other long term (current) drug therapy: Secondary | ICD-10-CM | POA: Insufficient documentation

## 2020-06-26 DIAGNOSIS — R531 Weakness: Secondary | ICD-10-CM | POA: Insufficient documentation

## 2020-06-26 DIAGNOSIS — I10 Essential (primary) hypertension: Secondary | ICD-10-CM | POA: Diagnosis not present

## 2020-06-26 DIAGNOSIS — I491 Atrial premature depolarization: Secondary | ICD-10-CM | POA: Diagnosis not present

## 2020-06-26 DIAGNOSIS — K8689 Other specified diseases of pancreas: Secondary | ICD-10-CM | POA: Diagnosis not present

## 2020-06-26 DIAGNOSIS — K852 Alcohol induced acute pancreatitis without necrosis or infection: Secondary | ICD-10-CM

## 2020-06-26 DIAGNOSIS — R29810 Facial weakness: Secondary | ICD-10-CM | POA: Diagnosis not present

## 2020-06-26 DIAGNOSIS — I700 Atherosclerosis of aorta: Secondary | ICD-10-CM | POA: Diagnosis not present

## 2020-06-26 DIAGNOSIS — R000 Tachycardia, unspecified: Secondary | ICD-10-CM | POA: Diagnosis not present

## 2020-06-26 LAB — CBC WITH DIFFERENTIAL/PLATELET
Abs Immature Granulocytes: 0.02 10*3/uL (ref 0.00–0.07)
Basophils Absolute: 0 10*3/uL (ref 0.0–0.1)
Basophils Relative: 0 %
Eosinophils Absolute: 0 10*3/uL (ref 0.0–0.5)
Eosinophils Relative: 0 %
HCT: 30.9 % — ABNORMAL LOW (ref 39.0–52.0)
Hemoglobin: 10 g/dL — ABNORMAL LOW (ref 13.0–17.0)
Immature Granulocytes: 0 %
Lymphocytes Relative: 18 %
Lymphs Abs: 1 10*3/uL (ref 0.7–4.0)
MCH: 34.6 pg — ABNORMAL HIGH (ref 26.0–34.0)
MCHC: 32.4 g/dL (ref 30.0–36.0)
MCV: 106.9 fL — ABNORMAL HIGH (ref 80.0–100.0)
Monocytes Absolute: 0.5 10*3/uL (ref 0.1–1.0)
Monocytes Relative: 9 %
Neutro Abs: 3.9 10*3/uL (ref 1.7–7.7)
Neutrophils Relative %: 73 %
Platelets: 100 10*3/uL — ABNORMAL LOW (ref 150–400)
RBC: 2.89 MIL/uL — ABNORMAL LOW (ref 4.22–5.81)
RDW: 14 % (ref 11.5–15.5)
WBC: 5.4 10*3/uL (ref 4.0–10.5)
nRBC: 0 % (ref 0.0–0.2)

## 2020-06-26 LAB — COMPREHENSIVE METABOLIC PANEL
ALT: 41 U/L (ref 0–44)
AST: 77 U/L — ABNORMAL HIGH (ref 15–41)
Albumin: 3.9 g/dL (ref 3.5–5.0)
Alkaline Phosphatase: 103 U/L (ref 38–126)
Anion gap: 16 — ABNORMAL HIGH (ref 5–15)
BUN: 13 mg/dL (ref 8–23)
CO2: 19 mmol/L — ABNORMAL LOW (ref 22–32)
Calcium: 9.4 mg/dL (ref 8.9–10.3)
Chloride: 103 mmol/L (ref 98–111)
Creatinine, Ser: 1.07 mg/dL (ref 0.61–1.24)
GFR, Estimated: >60 mL/min (ref >60)
Glucose, Bld: 84 mg/dL (ref 70–99)
Potassium: 4.1 mmol/L (ref 3.5–5.1)
Sodium: 138 mmol/L (ref 135–145)
Total Bilirubin: 0.3 mg/dL (ref 0.3–1.2)
Total Protein: 8.1 g/dL (ref 6.5–8.1)

## 2020-06-26 LAB — URINALYSIS, ROUTINE W REFLEX MICROSCOPIC
Bilirubin Urine: NEGATIVE
Glucose, UA: NEGATIVE mg/dL
Hgb urine dipstick: NEGATIVE
Ketones, ur: 5 mg/dL — AB
Leukocytes,Ua: NEGATIVE
Nitrite: NEGATIVE
Protein, ur: NEGATIVE mg/dL
Specific Gravity, Urine: 1.008 (ref 1.005–1.030)
pH: 7 (ref 5.0–8.0)

## 2020-06-26 LAB — LIPASE, BLOOD: Lipase: 179 U/L — ABNORMAL HIGH (ref 11–51)

## 2020-06-26 MED ORDER — DIPHENHYDRAMINE HCL 50 MG/ML IJ SOLN
25.0000 mg | Freq: Once | INTRAMUSCULAR | Status: AC
  Administered 2020-06-26: 25 mg via INTRAVENOUS
  Filled 2020-06-26: qty 1

## 2020-06-26 MED ORDER — FENTANYL CITRATE (PF) 100 MCG/2ML IJ SOLN
50.0000 ug | Freq: Once | INTRAMUSCULAR | Status: AC
Start: 2020-06-26 — End: 2020-06-26
  Administered 2020-06-26: 50 ug via INTRAVENOUS
  Filled 2020-06-26: qty 2

## 2020-06-26 MED ORDER — OXYCODONE-ACETAMINOPHEN 5-325 MG PO TABS: 1.0000 | ORAL_TABLET | Freq: Four times a day (QID) | ORAL | 0 refills | Status: DC | PRN

## 2020-06-26 MED ORDER — SODIUM CHLORIDE 0.9 % IV SOLN
1000.0000 mL | Freq: Once | INTRAVENOUS | Status: AC
  Administered 2020-06-26: 1000 mL via INTRAVENOUS

## 2020-06-26 MED ORDER — ONDANSETRON HCL 4 MG PO TABS: 4.0000 mg | ORAL_TABLET | Freq: Four times a day (QID) | ORAL | 0 refills | Status: DC

## 2020-06-26 MED ORDER — PROCHLORPERAZINE EDISYLATE 10 MG/2ML IJ SOLN
10.0000 mg | INTRAMUSCULAR | Status: DC | PRN
  Administered 2020-06-26: 10 mg via INTRAVENOUS
  Filled 2020-06-26: qty 2

## 2020-06-26 MED ORDER — ONDANSETRON HCL 4 MG/2ML IJ SOLN
4.0000 mg | Freq: Once | INTRAMUSCULAR | Status: AC
  Administered 2020-06-26: 4 mg via INTRAVENOUS
  Filled 2020-06-26: qty 2

## 2020-06-26 MED ORDER — PROCHLORPERAZINE MALEATE 10 MG PO TABS: 10.0000 mg | ORAL_TABLET | Freq: Two times a day (BID) | ORAL | 0 refills | Status: DC | PRN

## 2020-06-26 NOTE — Discharge Instructions (Addendum)
During your work-up today, we discovered you have pancreatitis.  This is a disease where the pancreas is inflamed.  I am writing you a prescription for Compazine.  Please take this twice a day as needed for nausea and vomiting.  I will also send you home with narcotic pain medicine.  Please take that as prescribed for pain.  Stick to a clear liquid diet for the next 3 to 5 days.  Instructions are attached.  Please discontinue using alcohol and smoking cigarettes while recovering.  If conditions worsen, you are unable to keep any fluids or food down, or if the pain becomes too unbearable to handle at home please return back to the emergency department for further evaluation.

## 2020-06-26 NOTE — ED Provider Notes (Signed)
I personally evaluated the patient during the encounter and completed a history, physical, procedures, medical decision making to contribute to the overall care of the patient and decision making for the patient briefly, the patient is a 65 y.o. male is here with abdominal pain, back pain.  History of alcohol use.  History of disc bulge in his back.  Not have any symptoms of cauda equina.  Neurovascular neuromuscularly intact.  Some diffuse abdominal tenderness with most in the upper abdomen.  We will get a CT scan and lab work to evaluate for pancreatitis versus cholecystitis versus colitis.  Overall suspect sciatic type pain and will consider Medrol Dosepak.  Overall will rule out pancreatitis and other intra-abdominal processes and likely treat for sciatica.  See PA note for further results, evaluation, disposition of the patient.  This chart was dictated using voice recognition software.  Despite best efforts to proofread,  errors can occur which can change the documentation meaning.    EKG Interpretation None             Lennice Sites, DO 06/26/20 1229

## 2020-06-26 NOTE — ED Triage Notes (Signed)
Hx of bulging disc and in back. Pt was mowing grass this morning and developed tingling, weakness, and pain in the left leg.   Hx of prostate CA.    VS per EMS, BG 95, lungs clear, 20G LAC, BP 178/98, HR 59.

## 2020-06-26 NOTE — ED Notes (Signed)
Scott Lindsey, Scott Lindsey made aware that pt daughter had concerns of patient not being able to ambulate. Pt ambulated down the hall with stable ambulation. Okay to d.c patient per [place], Scott Lindsey

## 2020-06-26 NOTE — ED Provider Notes (Signed)
[facility] DEPT Provider Note   CSN: [id number] Arrival date & time: 06/26/20  1137     History Chief Complaint  Patient presents with   Extremity Laceration   Leg Pain   Tingling   Emesis    [name] is a 65 y.o. male.  HPI  Patient presents with weakness, tingling, numbness to his left leg all the way down to his foot.  Started this morning after mowing the lawn.  He does have a history of a herniated disc.  He also has had 3-4 bouts of vomiting today.  He says he is having crampy epigastric abdominal pain which constant. He is not sure if it is related to his back pain.  He does smoke cigarettes, drinks alcohol, has history of prostate cancer.  He is not on any blood thinners, he has not had any strokes in the past.    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 ([place]) 09/26/2017   Thiamine deficiency 05/01/2016   Tobacco abuse disorder 04/26/2016   Hyperlipidemia LDL goal <100 04/26/2016   Erectile dysfunction due to arterial insufficiency 04/26/2016   Alcohol abuse 04/26/2016   Essential hypertension 02/27/2016   Prostate cancer ([place]) 12/12/2010    Past Surgical History:  Procedure Laterality Date   MULTIPLE TOOTH EXTRACTIONS     ROBOT ASSISTED LAPAROSCOPIC RADICAL PROSTATECTOMY  12/13/2010   Procedure: ROBOTIC ASSISTED LAPAROSCOPIC RADICAL PROSTATECTOMY;  Surgeon: Bernestine Amass, MD;  Location: [facility];  Service: Urology;  Laterality: N/A;  with Bilateral Pelivic Lymph Node Dissection       Family History  Problem Relation Age of Onset   Prostate cancer Brother     Social History   Tobacco Use   Smoking status: Every Day    Packs/day: 0.50    Years:  25.00    Pack years: 12.50    Types: Cigarettes   Smokeless tobacco: Never  Substance Use Topics   Alcohol use: Yes    Alcohol/week: 15.0 standard drinks    Types: 15 Shots of liquor per week    Comment: occasional   Drug use: No    Home Medications Prior to Admission medications   Medication Sig Start Date End Date Taking? Authorizing Provider  atorvastatin (LIPITOR) 20 MG tablet Take 1 tablet by mouth once daily 12/08/19   [name], MD  cloNIDine (CATAPRES) 0.1 MG tablet TAKE 1 TABLET BY MOUTH THREE TIMES DAILY 03/20/20   [name], MD  folic acid (FOLVITE) 1 MG tablet Take 1 tablet by mouth once daily 04/13/20   [name], MD  losartan (COZAAR) 100 MG tablet Take 1 tablet (100 mg total) by mouth daily. 07/01/19 09/30/19  [name], MD  magnesium oxide (MAG-OX) 400 (241.3 Mg) MG tablet Take 1 tablet (400 mg total) by mouth 2 (two) times daily. 04/05/20   [name], MD  metoprolol succinate (TOPROL-XL) 25 MG 24 hr tablet Take 1 tablet by mouth once daily 09/16/19   [name], MD  thiamine (VITAMIN B-1) 50 MG tablet Take 1 tablet (50 mg total) by mouth daily. 09/26/17   [name], MD    Allergies    Patient has no known  allergies.  Review of Systems   Review of Systems  Constitutional:  Negative for chills, fatigue and fever.  HENT:  Negative for ear pain and sore throat.   Eyes:  Negative for pain and visual disturbance.  Respiratory:  Negative for cough and shortness of breath.   Cardiovascular:  Negative for chest pain and palpitations.  Gastrointestinal:  Positive for abdominal pain, nausea and vomiting.  Genitourinary:  Negative for dysuria, frequency and hematuria.  Musculoskeletal:  Negative for arthralgias and back pain.  Skin:  Negative for color change and rash.  Neurological:  Positive for weakness and numbness. Negative for seizures, syncope and headaches.  All other systems reviewed and are  negative.  Physical Exam Updated Vital Signs BP (!) 155/88 (BP Location: Right Arm)   Pulse 63   Temp 98.7 F (37.1 C) (Oral)   Resp (!) 25   Ht 5\' 8"  (1.727 m)   SpO2 99%   BMI 22.29 kg/m   Physical Exam Vitals and nursing note reviewed. Exam conducted with a chaperone present.  Constitutional:      Appearance: Normal appearance.  HENT:     Head: Normocephalic and atraumatic.  Eyes:     General: No scleral icterus.       Right eye: No discharge.        Left eye: No discharge.     Extraocular Movements: Extraocular movements intact.     Pupils: Pupils are equal, round, and reactive to light.  Cardiovascular:     Rate and Rhythm: Normal rate and regular rhythm.     Pulses: Normal pulses.     Heart sounds: Normal heart sounds. No murmur heard.   No friction rub. No gallop.  Pulmonary:     Effort: Pulmonary effort is normal. No respiratory distress.     Breath sounds: Normal breath sounds.     Comments: Speaking in complete sentences, lungs are clear to auscultation bilaterally Abdominal:     General: Abdomen is flat. Bowel sounds are normal. There is no distension.     Palpations: Abdomen is soft.     Tenderness: There is abdominal tenderness in the epigastric area.     Comments: Epigastric tenderness.  No guarding or rebound.  No CVA tenderness.  Skin:    General: Skin is warm and dry.     Coloration: Skin is not jaundiced.  Neurological:     General: No focal deficit present.     Mental Status: He is alert. Mental status is at baseline.     Cranial Nerves: No cranial nerve deficit.     Motor: No weakness.     Coordination: Coordination normal.     Comments: Cranial nerves III through XII are intact bilaterally.  There is some tingling in his back with straight leg raise to the left.  Strength is 5 out of 5 x 4.  Sensation to light touch is intact.    ED Results / Procedures / Treatments   Labs (all labs ordered are listed, but only abnormal results are  displayed) Labs Reviewed - No data to display  EKG None  Radiology No results found.  Procedures Procedures   Medications Ordered in ED Medications - No data to display  ED Course  I have reviewed the triage vital signs and the nursing notes.  Pertinent labs & imaging results that were available during my care of the patient were reviewed by me and considered in my medical decision making (see chart for details).  Clinical Course  as of 06/26/20 1602  Sun Jun 26, 2020  1441 Lipase(!): 179 Border of 3x upper limit of normal for pancreatitis.  [HS]  1441 Hemoglobin(!): 10.0 Anemic. Lower than one year ago but not dramatically enough that I'm worried about GIB. Patient denies bloody stools or black tarry stools  [HS]  1442 Platelets(!): 100 Chronically low platelet count  [HS]  1530 PO challenging the patient.   [HS]    Clinical Course User Index [HS] Sherrill Raring, PA-C   MDM Rules/Calculators/A&P                          Patient is a 65 year old male presenting with abdominal pain, nausea, vomiting, leg weakness.  There are no focal deficits on exam, no trauma although he did feel his lawn recently.  This in combination with his history of a bulging disc makes me believe that the leg numbness/tingling is most likely sciatica.  Do not believe the patient is having a stroke based on exam.  Given his onset of abdominal pain, nausea, vomiting I will assess him for abdominal pain.  Pancreatitis is likely given his alcohol abuse and presentation.  CT shows signs of pancreatitis, this in combination with his epigastric pain and back pain, nausea, and vomiting is consistent with pancreatitis.   He has been struggling to keep fluids down here in the ED but the vomiting is under control now with compazine and benadryl.   Patient reports improvement of his pain.  He was able to pass a p.o. challenge.  Discussed risks and benefits of home treatment versus admission.  Given that patient is  able to keep fluids down, nausea and vomiting are controlled with meds, pain is controlled with medicine, he does not appear acutely ill, he is supported home, I believe he is appropriate for discharge.  Strict return precautions were given.  Appropriate nausea vomiting and pain medicine were prescribed.  Also g gave patient instructions for clear liquid diet as well as additional instructions endocarditis.  Discussed the plan with both the patient and his wife.  They verbalized understanding and agreement with the plan.  Discussed HPI, physical exam and plan of care for this patient with attending Lennice Sites. The attending physician evaluated this patient as part of a shared visit and agrees with plan of care.   Final Clinical Impression(s) / ED Diagnoses Final diagnoses:  None    Rx / DC Orders ED Discharge Orders     None        Sherrill Raring, PA-C 06/26/20 [place], [place], DO 06/29/20 1536

## 2020-06-30 ENCOUNTER — Other Ambulatory Visit: Payer: Self-pay | Admitting: Internal Medicine

## 2020-06-30 DIAGNOSIS — E785 Hyperlipidemia, unspecified: Secondary | ICD-10-CM

## 2020-07-05 ENCOUNTER — Other Ambulatory Visit: Payer: Self-pay

## 2020-07-05 ENCOUNTER — Encounter: Payer: Self-pay | Admitting: Internal Medicine

## 2020-07-05 ENCOUNTER — Ambulatory Visit (INDEPENDENT_AMBULATORY_CARE_PROVIDER_SITE_OTHER): Payer: Medicare Other | Admitting: Internal Medicine

## 2020-07-05 VITALS — BP 136/72 | Temp 98.2°F | Ht 68.0 in | Wt 143.0 lb

## 2020-07-05 DIAGNOSIS — E519 Thiamine deficiency, unspecified: Secondary | ICD-10-CM

## 2020-07-05 DIAGNOSIS — I10 Essential (primary) hypertension: Secondary | ICD-10-CM | POA: Diagnosis not present

## 2020-07-05 DIAGNOSIS — D539 Nutritional anemia, unspecified: Secondary | ICD-10-CM | POA: Diagnosis not present

## 2020-07-05 DIAGNOSIS — K852 Alcohol induced acute pancreatitis without necrosis or infection: Secondary | ICD-10-CM

## 2020-07-05 DIAGNOSIS — D520 Dietary folate deficiency anemia: Secondary | ICD-10-CM

## 2020-07-05 DIAGNOSIS — Z720 Tobacco use: Secondary | ICD-10-CM

## 2020-07-05 DIAGNOSIS — D696 Thrombocytopenia, unspecified: Secondary | ICD-10-CM

## 2020-07-05 DIAGNOSIS — Z23 Encounter for immunization: Secondary | ICD-10-CM | POA: Diagnosis not present

## 2020-07-05 LAB — CBC WITH DIFFERENTIAL/PLATELET
Basophils Absolute: 0 10*3/uL (ref 0.0–0.1)
Basophils Relative: 0.4 % (ref 0.0–3.0)
Eosinophils Absolute: 0.1 10*3/uL (ref 0.0–0.7)
Eosinophils Relative: 1.1 % (ref 0.0–5.0)
HCT: 34.5 % — ABNORMAL LOW (ref 39.0–52.0)
Hemoglobin: 11.7 g/dL — ABNORMAL LOW (ref 13.0–17.0)
Lymphocytes Relative: 27.6 % (ref 12.0–46.0)
Lymphs Abs: 1.5 10*3/uL (ref 0.7–4.0)
MCHC: 34 g/dL (ref 30.0–36.0)
MCV: 100.7 fl — ABNORMAL HIGH (ref 78.0–100.0)
Monocytes Absolute: 0.8 10*3/uL (ref 0.1–1.0)
Monocytes Relative: 15.1 % — ABNORMAL HIGH (ref 3.0–12.0)
Neutro Abs: 3.1 10*3/uL (ref 1.4–7.7)
Neutrophils Relative %: 55.8 % (ref 43.0–77.0)
Platelets: 193 10*3/uL (ref 150.0–400.0)
RBC: 3.42 Mil/uL — ABNORMAL LOW (ref 4.22–5.81)
RDW: 14.5 % (ref 11.5–15.5)
WBC: 5.6 10*3/uL (ref 4.0–10.5)

## 2020-07-05 LAB — HEPATIC FUNCTION PANEL
ALT: 22 U/L (ref 0–53)
AST: 30 U/L (ref 0–37)
Albumin: 4 g/dL (ref 3.5–5.2)
Alkaline Phosphatase: 124 U/L — ABNORMAL HIGH (ref 39–117)
Bilirubin, Direct: 0.1 mg/dL (ref 0.0–0.3)
Total Bilirubin: 0.5 mg/dL (ref 0.2–1.2)
Total Protein: 8.2 g/dL (ref 6.0–8.3)

## 2020-07-05 LAB — FERRITIN: Ferritin: 311.8 ng/mL (ref 22.0–322.0)

## 2020-07-05 LAB — BASIC METABOLIC PANEL
BUN: 9 mg/dL (ref 6–23)
CO2: 27 mEq/L (ref 19–32)
Calcium: 9.9 mg/dL (ref 8.4–10.5)
Chloride: 102 mEq/L (ref 96–112)
Creatinine, Ser: 1.05 mg/dL (ref 0.40–1.50)
GFR: 73.65 mL/min (ref >60.00)
Glucose, Bld: 126 mg/dL — ABNORMAL HIGH (ref 70–99)
Potassium: 3.9 mEq/L (ref 3.5–5.1)
Sodium: 138 mEq/L (ref 135–145)

## 2020-07-05 LAB — AMYLASE: Amylase: 142 U/L — ABNORMAL HIGH (ref 27–131)

## 2020-07-05 LAB — IRON: Iron: 50 ug/dL (ref 42–165)

## 2020-07-05 LAB — VITAMIN B12: Vitamin B-12: 773 pg/mL (ref 211–911)

## 2020-07-05 LAB — LIPASE: Lipase: 24 U/L (ref 11.0–59.0)

## 2020-07-05 LAB — FOLATE: Folate: >24.4 ng/mL (ref >5.9)

## 2020-07-05 NOTE — Patient Instructions (Signed)
Acute Pancreatitis  The pancreas is a gland that is located behind the stomach on the left side of the abdomen. It produces enzymes that help to digest food. The pancreas also releases the hormones glucagon and insulin, which help to regulate blood sugar. Acute pancreatitis happens when inflammation of the pancreas suddenly occursand the pancreas becomes irritated and swollen. Most acute attacks last a few days and cause serious problems. Some people become dehydrated and develop low blood pressure. In severe cases, bleeding in the abdomen can lead to shock and can be life-threatening. The lungs, heart,and kidneys may fail. What are the causes? This condition may be caused by: Alcohol abuse. Drug abuse. Gallstones or other conditions that can block the tube that drains the pancreas (pancreatic duct). A tumor in the pancreas. Other causes include: Certain medicines. Exposure to certain chemicals. Diabetes. An infection in the pancreas. Damage caused by an accident (trauma). The poison (venom) from a scorpion bite. Abdominal surgery. Autoimmune pancreatitis. This is when the body's disease-fighting (immune) system attacks the pancreas. Genes that are passed from parent to child (inherited). In some cases, the cause of this condition is not known. What are the signs or symptoms? Symptoms of this condition include: Pain in the upper abdomen that may radiate to the back. Pain may be severe. Tenderness and swelling of the abdomen. Nausea and vomiting. Fever. How is this diagnosed? This condition may be diagnosed based on: A physical exam. Blood tests. Imaging tests, such as X-rays, CT or MRI scans, or an ultrasound of the abdomen. How is this treated? Treatment for this condition usually requires a stay in the hospital. Treatment for this condition may include: Pain medicine. Fluid replacement through an IV. Placing a tube in the stomach to remove stomach contents and to control  vomiting (NG tube, or nasogastric tube). Not eating for 3-4 days. This gives the pancreas a rest, because enzymes are not being produced that can cause further damage. Antibiotic medicines, if your condition is caused by an infection. Treating any underlying conditions that may be the cause. Steroid medicines, if your condition is caused by your immune system attacking your body's own tissues (autoimmune disease). Surgery on the pancreas or gallbladder. Follow these instructions at home: Eating and drinking  Follow instructions from your health care provider about diet. This may involve avoiding alcohol and decreasing the amount of fat in your diet. Eat smaller, more frequent meals. This reduces the amount of digestive fluids that the pancreas produces. Drink enough fluid to keep your urine pale yellow. Do not drink alcohol if it caused your condition.  General instructions Take over-the-counter and prescription medicines only as told by your health care provider. Do not drive or use heavy machinery while taking prescription pain medicine. Ask your health care provider if the medicine prescribed to you can cause constipation. You may need to take steps to prevent or treat constipation, such as: Take an over-the-counter or prescription medicine for constipation. Eat foods that are high in fiber such as whole grains and beans. Limit foods that are high in fat and processed sugars, such as fried or sweet foods. Do not use any products that contain nicotine or tobacco, such as cigarettes, e-cigarettes, and chewing tobacco. If you need help quitting, ask your health care provider. Get plenty of rest. If directed, check your blood sugar at home as told by your health care provider. Keep all follow-up visits as told by your health care provider. This is important. Contact a health care  provider if you: Do not recover as quickly as expected. Develop new or worsening symptoms. Have persistent  pain, weakness, or nausea. Recover and then have another episode of pain. Have a fever. Get help right away if: You cannot eat or keep fluids down. Your pain becomes severe. Your skin or the white part of your eyes turns yellow (jaundice). You have sudden swelling in your abdomen. You vomit. You feel dizzy or you faint. Your blood sugar is high (over 300 mg/dL). Summary Acute pancreatitis happens when inflammation of the pancreas suddenly occurs and the pancreas becomes irritated and swollen. This condition is typically caused by alcohol abuse, drug abuse, or gallstones. Treatment for this condition usually requires a stay in the hospital. This information is not intended to replace advice given to you by your health care provider. Make sure you discuss any questions you have with your healthcare provider. Document Revised: 10/14/2017 Document Reviewed: 07/01/2017 Elsevier Patient Education  [place].

## 2020-07-05 NOTE — Progress Notes (Signed)
Subjective:  Patient ID: Scott Lindsey, male    DOB: [birth year]  Age: 65 y.o. MRN: [id number]  CC: Anemia  This visit occurred during the SARS-CoV-2 public health emergency.  Safety protocols were in place, including screening questions prior to the visit, additional usage of staff PPE, and extensive cleaning of exam room while observing appropriate contact time as indicated for disinfecting solutions.    HPI Scott Lindsey presents for f/up -  He was recently seen in the ED for abdominal and back pain.  He was found to have pancreatitis secondary to alcohol intake.  He tells me he is abstained from alcohol for least a week.  His appetite is back to normal and he is gaining weight.  He denies abdominal pain, nausea, vomiting, diarrhea, bright red blood per rectum, or melena.  He was anemic.  Outpatient Medications Prior to Visit  Medication Sig Dispense Refill   atorvastatin (LIPITOR) 20 MG tablet Take 1 tablet by mouth once daily 90 tablet 0   cloNIDine (CATAPRES) 0.1 MG tablet TAKE 1 TABLET BY MOUTH THREE TIMES DAILY (Patient taking differently: Take 0.1 mg by mouth 3 (three) times daily.) 115 tablet 0   folic acid (FOLVITE) 1 MG tablet Take 1 tablet by mouth once daily 90 tablet 0   magnesium oxide (MAG-OX) 400 (241.3 Mg) MG tablet Take 1 tablet (400 mg total) by mouth 2 (two) times daily. 180 tablet 3   magnesium oxide (MAG-OX) 400 MG tablet Take 400 mg by mouth 2 (two) times daily.     metoprolol succinate (TOPROL-XL) 25 MG 24 hr tablet Take 1 tablet by mouth once daily 30 tablet 10   Multiple Vitamin (MULTIVITAMIN WITH MINERALS) TABS tablet Take 1 tablet by mouth daily.     thiamine (VITAMIN B-1) 50 MG tablet Take 1 tablet (50 mg total) by mouth daily. 90 tablet 1   oxyCODONE-acetaminophen (PERCOCET/ROXICET) 5-325 MG tablet Take 1 tablet by mouth every 6 (six) hours as needed for up to 10 doses for severe pain. 10 tablet 0   prochlorperazine (COMPAZINE) 10 MG tablet Take 1 tablet (10 mg  total) by mouth 2 (two) times daily as needed for nausea or vomiting. 10 tablet 0   losartan (COZAAR) 100 MG tablet Take 1 tablet (100 mg total) by mouth daily. 90 tablet 3   No facility-administered medications prior to visit.    ROS Review of Systems  Constitutional:  Negative for chills, diaphoresis, fatigue and fever.  HENT: Negative.  Negative for sore throat.   Eyes: Negative.   Respiratory:  Negative for cough, chest tightness, shortness of breath and wheezing.   Cardiovascular:  Negative for chest pain, palpitations and leg swelling.  Gastrointestinal:  Negative for abdominal pain, blood in stool, constipation, diarrhea, nausea and vomiting.  Endocrine: Negative.   Genitourinary:  Negative for difficulty urinating and hematuria.  Musculoskeletal: Negative.  Negative for back pain.  Skin: Negative.   Allergic/Immunologic: Negative.   Neurological: Negative.  Negative for dizziness and light-headedness.  Hematological: Negative.  Negative for adenopathy. Does not bruise/bleed easily.  Psychiatric/Behavioral: Negative.     Objective:  BP 136/72 (BP Location: Left Arm, Patient Position: Sitting, Cuff Size: Large)   Temp 98.2 F (36.8 C) (Oral)   Ht 5\' 8"  (1.727 m)   Wt 143 lb (64.9 kg)   BMI 21.74 kg/m   BP Readings from Last 3 Encounters:  07/05/20 136/72  06/26/20 (!) 155/91  04/04/20 (!) 168/90    Wt Readings from  Last 3 Encounters:  07/05/20 143 lb (64.9 kg)  04/04/20 146 lb 9.6 oz (66.5 kg)  01/05/20 147 lb 9.6 oz (67 kg)    Physical Exam Vitals reviewed.  Constitutional:      Appearance: Normal appearance. He is not ill-appearing.  HENT:     Nose: Nose normal.     Mouth/Throat:     Mouth: Mucous membranes are moist.  Eyes:     General: No scleral icterus.    Conjunctiva/sclera: Conjunctivae normal.  Cardiovascular:     Rate and Rhythm: Normal rate and regular rhythm.     Heart sounds: No murmur heard. Pulmonary:     Effort: Pulmonary effort is  normal.     Breath sounds: No stridor. No wheezing, rhonchi or rales.  Abdominal:     General: Abdomen is flat. Bowel sounds are normal.     Palpations: Abdomen is soft. There is splenomegaly. There is no hepatomegaly or mass.     Tenderness: There is abdominal tenderness in the epigastric area and left upper quadrant. There is no guarding or rebound.  Musculoskeletal:        General: Normal range of motion.     Cervical back: Neck supple.     Right lower leg: No edema.     Left lower leg: No edema.  Lymphadenopathy:     Cervical: No cervical adenopathy.  Skin:    General: Skin is warm and dry.  Neurological:     General: No focal deficit present.     Mental Status: He is alert.  Psychiatric:        Mood and Affect: Mood normal.        Behavior: Behavior normal.    Lab Results  Component Value Date   WBC 5.6 07/05/2020   HGB 11.7 (L) 07/05/2020   HCT 34.5 (L) 07/05/2020   PLT 193.0 07/05/2020   GLUCOSE 126 (H) 07/05/2020   CHOL 152 04/04/2020   TRIG 55 04/04/2020   HDL 91 04/04/2020   LDLCALC 49 04/04/2020   ALT 22 07/05/2020   AST 30 07/05/2020   NA 138 07/05/2020   K 3.9 07/05/2020   CL 102 07/05/2020   CREATININE 1.05 07/05/2020   BUN 9 07/05/2020   CO2 27 07/05/2020   TSH 1.60 03/17/2019   PSA 0.00 (L) 03/17/2019    CT Abdomen Pelvis Wo Contrast  Result Date: 06/26/2020 CLINICAL DATA:  Epigastric pain. EXAM: CT ABDOMEN AND PELVIS WITHOUT CONTRAST TECHNIQUE: Multidetector CT imaging of the abdomen and pelvis was performed following the standard protocol without IV contrast. COMPARISON:  Ultrasound of the abdomen Jun 08, 2014 FINDINGS: Lower chest: Bibasilar chronic interstitial lung changes Hepatobiliary: No focal liver abnormality is seen. No gallstones, gallbladder wall thickening, or biliary dilatation. Pancreas: Diffuse pancreatic and peripancreatic edema. Spleen: Choose once Adrenals/Urinary Tract: Adrenal glands are unremarkable. Kidneys are normal, without  renal calculi, focal lesion, or hydronephrosis. Bladder is unremarkable. Stomach/Bowel: Stomach is within normal limits. Appendix appears normal. No evidence of bowel wall thickening, distention, or inflammatory changes. Vascular/Lymphatic: Aortic atherosclerosis. No enlarged abdominal or pelvic lymph nodes. Reproductive: Post prostatectomy. Other: No abdominal wall hernia or abnormality. No abdominopelvic ascites. Musculoskeletal: Spondylosis of the lower lumbosacral spine. IMPRESSION: 1. Diffuse pancreatic and peripancreatic edema, consistent with acute pancreatitis. 2. Bibasilar chronic interstitial lung changes. 3. Advanced aortic atherosclerosis. Aortic Atherosclerosis (ICD10-I70.0). Electronically Signed   By: Fidela Salisbury M.D.   On: 06/26/2020 15:16    Assessment & Plan:   Blair was seen  today for anemia.  Diagnoses and all orders for this visit:  Dietary folate deficiency anemia -     CBC with Differential/Platelet; Future -     Vitamin B12; Future -     Folate; Future -     Folate -     Vitamin B12 -     CBC with Differential/Platelet  Essential hypertension- His blood pressure is adequately well controlled. -     Basic metabolic panel; Future -     Basic metabolic panel  Thiamine deficiency -     CBC with Differential/Platelet; Future -     Vitamin B1; Future -     Vitamin B1 -     CBC with Differential/Platelet  Alcohol-induced acute pancreatitis without infection or necrosis- By all accounts this has improved.  He agrees to abstain from alcohol intake. -     Lipase; Future -     Amylase; Future -     Hepatic function panel; Future -     Hepatic function panel -     Amylase -     Lipase  Deficiency anemia- His H&H have improved.  I will screen him for vitamin deficiencies. -     CBC with Differential/Platelet; Future -     Iron; Future -     Vitamin B12; Future -     Vitamin B1; Future -     Folate; Future -     Ferritin; Future -     Reticulocytes;  Future -     Reticulocytes -     Ferritin -     Folate -     Vitamin B1 -     Vitamin B12 -     Iron -     CBC with Differential/Platelet  Thrombocytopenia ([place])- His PLT count is normal now. -     CBC with Differential/Platelet; Future -     Vitamin B12; Future -     Folate; Future -     Folate -     Vitamin B12 -     CBC with Differential/Platelet  Tobacco abuse disorder -     Ambulatory Referral for Lung Cancer Scre  Other orders -     Pneumococcal polysaccharide vaccine 23-valent greater than or equal to 2yo subcutaneous/IM  I have discontinued Garen Lah. Follett's prochlorperazine and oxyCODONE-acetaminophen. I am also having him maintain his thiamine, losartan, metoprolol succinate, cloNIDine, magnesium oxide, folic acid, magnesium oxide, multivitamin with minerals, and atorvastatin.  No orders of the defined types were placed in this encounter.    Follow-up: Return in about 3 weeks (around 07/26/2020).  Scarlette Calico, MD

## 2020-07-09 LAB — RETICULOCYTES
ABS Retic: 52500 cells/uL (ref 25000–90000)
Retic Ct Pct: 1.5 %

## 2020-07-09 LAB — VITAMIN B1: Vitamin B1 (Thiamine): 48 nmol/L — ABNORMAL HIGH (ref 8–30)

## 2020-07-19 ENCOUNTER — Other Ambulatory Visit: Payer: Self-pay | Admitting: Internal Medicine

## 2020-07-19 DIAGNOSIS — D520 Dietary folate deficiency anemia: Secondary | ICD-10-CM

## 2020-08-09 ENCOUNTER — Other Ambulatory Visit: Payer: Self-pay

## 2020-08-09 ENCOUNTER — Ambulatory Visit (INDEPENDENT_AMBULATORY_CARE_PROVIDER_SITE_OTHER): Payer: Medicare Other | Admitting: Internal Medicine

## 2020-08-09 ENCOUNTER — Encounter: Payer: Self-pay | Admitting: Internal Medicine

## 2020-08-09 VITALS — BP 138/74 | HR 76 | Temp 98.2°F | Resp 16 | Ht 68.0 in | Wt 144.0 lb

## 2020-08-09 DIAGNOSIS — J410 Simple chronic bronchitis: Secondary | ICD-10-CM | POA: Diagnosis not present

## 2020-08-09 DIAGNOSIS — D520 Dietary folate deficiency anemia: Secondary | ICD-10-CM | POA: Diagnosis not present

## 2020-08-09 DIAGNOSIS — E519 Thiamine deficiency, unspecified: Secondary | ICD-10-CM | POA: Diagnosis not present

## 2020-08-09 DIAGNOSIS — I10 Essential (primary) hypertension: Secondary | ICD-10-CM | POA: Diagnosis not present

## 2020-08-09 DIAGNOSIS — I700 Atherosclerosis of aorta: Secondary | ICD-10-CM | POA: Diagnosis not present

## 2020-08-09 DIAGNOSIS — E785 Hyperlipidemia, unspecified: Secondary | ICD-10-CM

## 2020-08-09 DIAGNOSIS — Z125 Encounter for screening for malignant neoplasm of prostate: Secondary | ICD-10-CM | POA: Insufficient documentation

## 2020-08-09 DIAGNOSIS — C61 Malignant neoplasm of prostate: Secondary | ICD-10-CM | POA: Diagnosis not present

## 2020-08-09 DIAGNOSIS — Z23 Encounter for immunization: Secondary | ICD-10-CM

## 2020-08-09 LAB — CBC WITH DIFFERENTIAL/PLATELET
Basophils Absolute: 0 10*3/uL (ref 0.0–0.1)
Basophils Relative: 0.4 % (ref 0.0–3.0)
Eosinophils Absolute: 0.1 10*3/uL (ref 0.0–0.7)
Eosinophils Relative: 1 % (ref 0.0–5.0)
HCT: 37.5 % — ABNORMAL LOW (ref 39.0–52.0)
Hemoglobin: 12.6 g/dL — ABNORMAL LOW (ref 13.0–17.0)
Lymphocytes Relative: 32 % (ref 12.0–46.0)
Lymphs Abs: 1.6 10*3/uL (ref 0.7–4.0)
MCHC: 33.5 g/dL (ref 30.0–36.0)
MCV: 100.8 fl — ABNORMAL HIGH (ref 78.0–100.0)
Monocytes Absolute: 0.5 10*3/uL (ref 0.1–1.0)
Monocytes Relative: 10.3 % (ref 3.0–12.0)
Neutro Abs: 2.9 10*3/uL (ref 1.4–7.7)
Neutrophils Relative %: 56.3 % (ref 43.0–77.0)
Platelets: 170 10*3/uL (ref 150.0–400.0)
RBC: 3.72 Mil/uL — ABNORMAL LOW (ref 4.22–5.81)
RDW: 14.7 % (ref 11.5–15.5)
WBC: 5.2 10*3/uL (ref 4.0–10.5)

## 2020-08-09 LAB — TSH: TSH: 1.45 u[IU]/mL (ref 0.35–5.50)

## 2020-08-09 LAB — PSA: PSA: 0 ng/mL — ABNORMAL LOW (ref 0.10–4.00)

## 2020-08-09 MED ORDER — SHINGRIX 50 MCG/0.5ML IM SUSR: 0.5000 mL | Freq: Once | INTRAMUSCULAR | 1 refills | Status: AC

## 2020-08-09 NOTE — Patient Instructions (Signed)
[employer] medicine (25th ed., pp. 1059-1068). [place], [place]: Elsevier.">  Anemia  Anemia is a condition in which there is not enough red blood cells or hemoglobin in the blood. Hemoglobin is a substance in red blood cells thatcarries oxygen. When you do not have enough red blood cells or hemoglobin (are anemic), your body cannot get enough oxygen and your organs may not work properly. Asa result, you may feel very tired or have other problems. What are the causes? Common causes of anemia include: Excessive bleeding. Anemia can be caused by excessive bleeding inside or outside the body, including bleeding from the intestines or from heavy menstrual periods in females. Poor nutrition. Long-lasting (chronic) kidney, thyroid, and liver disease. Bone marrow disorders, spleen problems, and blood disorders. Cancer and treatments for cancer. HIV (human immunodeficiency virus) and AIDS (acquired immunodeficiency syndrome). Infections, medicines, and autoimmune disorders that destroy red blood cells. What are the signs or symptoms? Symptoms of this condition include: Minor weakness. Dizziness. Headache, or difficulties concentrating and sleeping. Heartbeats that feel irregular or faster than normal (palpitations). Shortness of breath, especially with exercise. Pale skin, lips, and nails, or cold hands and feet. Indigestion and nausea. Symptoms may occur suddenly or develop slowly. If your anemia is mild, you maynot have symptoms. How is this diagnosed? This condition is diagnosed based on blood tests, your medical history, and a physical exam. In some cases, a test may be needed in which cells are removed from the soft tissue inside of a bone and looked at under a microscope (bone marrow biopsy). Your health care provider may also check your stool (feces) for blood and may do additional testing to look for the cause of yourbleeding. Other tests may include: Imaging tests, such as a CT scan or  MRI. A procedure to see inside your esophagus and stomach (endoscopy). A procedure to see inside your colon and rectum (colonoscopy). How is this treated? Treatment for this condition depends on the cause. If you continue to lose a lot of blood, you may need to be treated at a hospital. Treatment may include: Taking supplements of iron, vitamin B12, or folic acid. Taking a hormone medicine (erythropoietin) that can help to stimulate red blood cell growth. Having a blood transfusion. This may be needed if you lose a lot of blood. Making changes to your diet. Having surgery to remove your spleen. Follow these instructions at home: Take over-the-counter and prescription medicines only as told by your health care provider. Take supplements only as told by your health care provider. Follow any diet instructions that you were given by your health care provider. Keep all follow-up visits as told by your health care provider. This is important. Contact a health care provider if: You develop new bleeding anywhere in the body. Get help right away if: You are very weak. You are short of breath. You have pain in your abdomen or chest. You are dizzy or feel faint. You have trouble concentrating. You have bloody stools, black stools, or tarry stools. You vomit repeatedly or you vomit up blood. These symptoms may represent a serious problem that is an emergency. Do not wait to see if the symptoms will go away. Get medical help right away. Call your local emergency services (911 in the [place] Do not drive yourself to the hospital. Summary Anemia is a condition in which you do not have enough red blood cells or enough of a substance in your red blood cells that carries oxygen (hemoglobin). Symptoms may occur suddenly   or develop slowly. If your anemia is mild, you may not have symptoms. This condition is diagnosed with blood tests, a medical history, and a physical exam. Other tests may be  needed. Treatment for this condition depends on the cause of the anemia. This information is not intended to replace advice given to you by your health care provider. Make sure you discuss any questions you have with your healthcare provider. Document Revised: 12/02/2018 Document Reviewed: 12/02/2018 Elsevier Patient Education  2022 [employer].

## 2020-08-09 NOTE — Progress Notes (Signed)
Subjective:  Patient ID: Scott Lindsey, male    DOB: Dec 14, 1955  Age: 65 y.o. MRN: [id number]  CC: COPD and Anemia  This visit occurred during the SARS-CoV-2 public health emergency.  Safety protocols were in place, including screening questions prior to the visit, additional usage of staff PPE, and extensive cleaning of exam room while observing appropriate contact time as indicated for disinfecting solutions.    HPI Scott Lindsey presents for f/up -   He tells me he is compliant with his vitamin supplements and he is abstaining from alcohol intake.  He continues to smoke cigarettes.  He has a mild intermittent cough that is productive of clear phlegm.  He denies chest pain, hemoptysis, wheezing, or shortness of breath.  Outpatient Medications Prior to Visit  Medication Sig Dispense Refill   atorvastatin (LIPITOR) 20 MG tablet Take 1 tablet by mouth once daily 90 tablet 0   cloNIDine (CATAPRES) 0.1 MG tablet TAKE 1 TABLET BY MOUTH THREE TIMES DAILY (Patient taking differently: Take 0.1 mg by mouth 3 (three) times daily.) [id number] tablet 0   folic acid (FOLVITE) 1 MG tablet Take 1 tablet by mouth once daily 90 tablet 0   magnesium oxide (MAG-OX) 400 (241.3 Mg) MG tablet Take 1 tablet (400 mg total) by mouth 2 (two) times daily. 180 tablet 3   magnesium oxide (MAG-OX) 400 MG tablet Take 400 mg by mouth 2 (two) times daily.     metoprolol succinate (TOPROL-XL) 25 MG 24 hr tablet Take 1 tablet by mouth once daily 30 tablet 10   Multiple Vitamin (MULTIVITAMIN WITH MINERALS) TABS tablet Take 1 tablet by mouth daily.     thiamine (VITAMIN B-1) 50 MG tablet Take 1 tablet (50 mg total) by mouth daily. 90 tablet 1   losartan (COZAAR) 100 MG tablet Take 1 tablet (100 mg total) by mouth daily. 90 tablet 3   No facility-administered medications prior to visit.    ROS Review of Systems  Constitutional:  Negative for appetite change, diaphoresis, fatigue and unexpected weight change.  HENT: Negative.   Negative for sore throat.   Eyes:  Negative for visual disturbance.  Respiratory:  Positive for cough. Negative for shortness of breath and wheezing.   Cardiovascular:  Negative for chest pain, palpitations and leg swelling.  Gastrointestinal:  Negative for abdominal pain, constipation, diarrhea, nausea and vomiting.  Genitourinary: Negative.  Negative for difficulty urinating.  Musculoskeletal:  Negative for arthralgias, back pain, myalgias and neck pain.  Skin: Negative.  Negative for color change and pallor.  Neurological:  Negative for dizziness, weakness and light-headedness.  Hematological:  Negative for adenopathy. Does not bruise/bleed easily.  Psychiatric/Behavioral: Negative.     Objective:  BP 138/74 (BP Location: Left Arm, Patient Position: Sitting, Cuff Size: Large)   Pulse 76   Temp 98.2 F (36.8 C) (Oral)   Resp 16   Ht '5\' 8"'$  (1.727 m)   Wt 144 lb (65.3 kg)   SpO2 96%   BMI 21.90 kg/m   BP Readings from Last 3 Encounters:  08/09/20 138/74  07/05/20 136/72  06/26/20 (!) 155/91    Wt Readings from Last 3 Encounters:  08/09/20 144 lb (65.3 kg)  07/05/20 143 lb (64.9 kg)  04/04/20 146 lb 9.6 oz (66.5 kg)    Physical Exam Vitals reviewed.  HENT:     Nose: Nose normal.     Mouth/Throat:     Mouth: Mucous membranes are moist.  Eyes:     Conjunctiva/sclera:  Conjunctivae normal.  Cardiovascular:     Rate and Rhythm: Normal rate and regular rhythm.     Heart sounds: No murmur heard. Pulmonary:     Effort: Pulmonary effort is normal. No tachypnea.     Breath sounds: Examination of the right-lower field reveals rales. Examination of the left-lower field reveals rales. Rales present. No decreased breath sounds, wheezing or rhonchi.  Abdominal:     General: Abdomen is flat.     Palpations: There is no mass.     Tenderness: There is no abdominal tenderness. There is no guarding.     Hernia: No hernia is present.  Musculoskeletal:        General: Normal range of  motion.     Cervical back: Neck supple.     Right lower leg: No edema.     Left lower leg: No edema.  Lymphadenopathy:     Cervical: No cervical adenopathy.  Skin:    General: Skin is warm and dry.     Findings: No rash.  Neurological:     General: No focal deficit present.  Psychiatric:        Mood and Affect: Mood normal.        Behavior: Behavior normal.    Lab Results  Component Value Date   WBC 5.2 08/09/2020   HGB 12.6 (L) 08/09/2020   HCT 37.5 (L) 08/09/2020   PLT 170.0 08/09/2020   GLUCOSE 126 (H) 07/05/2020   CHOL 152 04/04/2020   TRIG 55 04/04/2020   HDL 91 04/04/2020   LDLCALC 49 04/04/2020   ALT 22 07/05/2020   AST 30 07/05/2020   NA 138 07/05/2020   K 3.9 07/05/2020   CL 102 07/05/2020   CREATININE 1.05 07/05/2020   BUN 9 07/05/2020   CO2 27 07/05/2020   TSH 1.45 08/09/2020   PSA 0.00 (L) 08/09/2020    CT Abdomen Pelvis Wo Contrast  Result Date: 06/26/2020 CLINICAL DATA:  Epigastric pain. EXAM: CT ABDOMEN AND PELVIS WITHOUT CONTRAST TECHNIQUE: Multidetector CT imaging of the abdomen and pelvis was performed following the standard protocol without IV contrast. COMPARISON:  Ultrasound of the abdomen Jun 08, 2014 FINDINGS: Lower chest: Bibasilar chronic interstitial lung changes Hepatobiliary: No focal liver abnormality is seen. No gallstones, gallbladder wall thickening, or biliary dilatation. Pancreas: Diffuse pancreatic and peripancreatic edema. Spleen: Choose once Adrenals/Urinary Tract: Adrenal glands are unremarkable. Kidneys are normal, without renal calculi, focal lesion, or hydronephrosis. Bladder is unremarkable. Stomach/Bowel: Stomach is within normal limits. Appendix appears normal. No evidence of bowel wall thickening, distention, or inflammatory changes. Vascular/Lymphatic: Aortic atherosclerosis. No enlarged abdominal or pelvic lymph nodes. Reproductive: Post prostatectomy. Other: No abdominal wall hernia or abnormality. No abdominopelvic ascites.  Musculoskeletal: Spondylosis of the lower lumbosacral spine. IMPRESSION: 1. Diffuse pancreatic and peripancreatic edema, consistent with acute pancreatitis. 2. Bibasilar chronic interstitial lung changes. 3. Advanced aortic atherosclerosis. Aortic Atherosclerosis (ICD10-I70.0). Electronically Signed   By: Fidela Salisbury M.D.   On: 06/26/2020 15:16    Assessment & Plan:   Scotti was seen today for copd and anemia.  Diagnoses and all orders for this visit:  Thiamine deficiency- His H&H have improved -     CBC with Differential/Platelet; Future -     CBC with Differential/Platelet  Dietary folate deficiency anemia- His H&H have improved -     CBC with Differential/Platelet; Future -     CBC with Differential/Platelet  Atherosclerosis of aorta (HCC)- Risk factor modifications addressed  Prostate cancer screening  Essential hypertension-  His blood pressure is adequately well controlled. -     TSH; Future -     TSH  Hyperlipidemia LDL goal <100 -     TSH; Future -     TSH  Prostate cancer ([place])- There is no evidence of recurrence. -     PSA; Future -     PSA  Need for shingles vaccine -     Zoster Vaccine Adjuvanted [facility]) injection; Inject 0.5 mLs into the muscle once for 1 dose.  I am having Garen Lah. [place] start on Shingrix. I am also having him maintain his thiamine, losartan, metoprolol succinate, cloNIDine, magnesium oxide, magnesium oxide, multivitamin with minerals, atorvastatin, and folic acid.  Meds ordered this encounter  Medications   Zoster Vaccine Adjuvanted [facility]) injection    Sig: Inject 0.5 mLs into the muscle once for 1 dose.    Dispense:  0.5 mL    Refill:  1      Follow-up: Return in about 4 months (around 12/09/2020).  Scarlette Calico, MD

## 2020-08-11 DIAGNOSIS — J410 Simple chronic bronchitis: Secondary | ICD-10-CM | POA: Insufficient documentation

## 2020-08-11 DIAGNOSIS — Z23 Encounter for immunization: Secondary | ICD-10-CM | POA: Insufficient documentation

## 2020-08-11 NOTE — Assessment & Plan Note (Signed)
He is not willing to use an inhaler for this

## 2020-08-14 ENCOUNTER — Other Ambulatory Visit: Payer: Self-pay | Admitting: Internal Medicine

## 2020-08-14 DIAGNOSIS — I10 Essential (primary) hypertension: Secondary | ICD-10-CM

## 2020-09-13 ENCOUNTER — Other Ambulatory Visit: Payer: Self-pay | Admitting: Cardiology

## 2020-09-14 ENCOUNTER — Telehealth: Payer: Self-pay | Admitting: Internal Medicine

## 2020-09-14 NOTE — Telephone Encounter (Signed)
Left message for patient to call me back at [phone] to schedule Medicare Annual Wellness Visit   No hx of AWV eligible as of 09/09/14  Please schedule at anytime with [facility] Advisor if patient calls the office back.    40 Minutes appointment   Any questions, please call me at [phone]

## 2020-09-21 ENCOUNTER — Other Ambulatory Visit: Payer: Self-pay | Admitting: Cardiology

## 2020-09-21 NOTE — Telephone Encounter (Signed)
This is Dr. Schumann's pt 

## 2020-10-02 NOTE — Progress Notes (Deleted)
Cardiology Office Note:    Date:  10/02/2020   ID:  Scott Lindsey, DOB 1955/06/25, MRN [id number]  PCP:  [name], MD  Cardiologist:  None  Electrophysiologist:  None   Referring MD: [name], MD   No chief complaint on file.   History of Present Illness:    Scott Lindsey is a 65 y.o. male with a hx of prostate cancer, hyperlipidemia, hypertension who presents for follow-up.  He was referred by Dr. Ronnald Ramp for evaluation of PVCs.  Patient saw Dr. Ronnald Ramp for his annual physical and noted on EKG to have frequent PVCs.  He reports he has been asymptomatic.  Denies any palpitations, chest pain, dyspnea, lower extremity edema.  Labs 03/17/2019 notable for hypokalemia (3.4), hypomagnesemia (1.3).  He was started on magnesium supplements in addition to his potassium supplements.  At initial visit on 03/18/2019, it was recommended to discontinue indapamide, as suspected that was causing his electrolyte abnormalities.  He was switched to losartan 50 mg daily for hypertension.  Repeat labs on 03/24/2019 showed potassium 4.3, mag 1.6.  Echocardiogram on 04/28/2019 showed normal biventricular function, mild mitral regurgitation.  Zio patch x3 days on 04/30/2019 showed frequent PVCs (10.4% of beats).  He was started on Toprol-XL 25 mg daily.  Repeat Zio patch x3 days on 01/21/2020 showed PVC burden less than 1%.  Since last clinic visit,  he reports that he has been doing well.  Denies any chest pain, dyspnea, lightheadedness, syncope, lower extremity edema, or palpitations.  Smoking 6-7 cigarretes per day.  Continues to drink vodka daily.  BP elevated in clinic today, reports he did not take his meds.    Past Medical History:  Diagnosis Date   Back pain     Past Surgical History:  Procedure Laterality Date   MULTIPLE TOOTH EXTRACTIONS     ROBOT ASSISTED LAPAROSCOPIC RADICAL PROSTATECTOMY  12/13/2010   Procedure: ROBOTIC ASSISTED LAPAROSCOPIC RADICAL PROSTATECTOMY;  Surgeon: Bernestine Amass,  MD;  Location: [facility];  Service: Urology;  Laterality: N/A;  with Bilateral Pelivic Lymph Node Dissection    Current Medications: No outpatient medications have been marked as taking for the 10/03/20 encounter (Appointment) with [name], MD.     Allergies:   Patient has no known allergies.   Social History   Socioeconomic History   Marital status: Married    Spouse name: Not on file   Number of children: Not on file   Years of education: Not on file   Highest education level: Not on file  Occupational History   Not on file  Tobacco Use   Smoking status: Every Day    Packs/day: 0.50    Years: 25.00    Pack years: 12.50    Types: Cigarettes   Smokeless tobacco: Never  Substance and Sexual Activity   Alcohol use: Yes    Alcohol/week: 15.0 standard drinks    Types: 15 Shots of liquor per week    Comment: occasional   Drug use: No   Sexual activity: Yes    Partners: Female  Other Topics Concern   Not on file  Social History Narrative   Not on file   Social Determinants of Health   Financial Resource Strain: Not on file  Food Insecurity: Not on file  Transportation Needs: Not on file  Physical Activity: Not on file  Stress: Not on file  Social Connections: Not on file     Family History: The patient's family history includes  Prostate cancer in his brother.  ROS:   Please see the history of present illness.     All other systems reviewed and are negative.  EKGs/Labs/Other Studies Reviewed:    The following studies were reviewed today:   EKG:  EKG is  ordered today.  The ekg ordered today demonstrates normal sinus rhythm, rate 60, no PVCs  Recent Labs: 05/02/2020: Magnesium 1.9 07/05/2020: ALT 22; BUN 9; Creatinine, Ser 1.05; Potassium 3.9; Sodium 138 08/09/2020: Hemoglobin 12.6; Platelets 170.0; TSH 1.45  Recent Lipid Panel    Component Value Date/Time   CHOL 152 04/04/2020 1141   TRIG 55 04/04/2020 1141   HDL 91 04/04/2020 1141   CHOLHDL  1.7 04/04/2020 1141   CHOLHDL 2 03/17/2019 1002   VLDL 19.8 03/17/2019 1002   LDLCALC 49 04/04/2020 1141    Physical Exam:    VS:  There were no vitals taken for this visit.    Wt Readings from Last 3 Encounters:  08/09/20 144 lb (65.3 kg)  07/05/20 143 lb (64.9 kg)  04/04/20 146 lb 9.6 oz (66.5 kg)     GEN: in no acute distress HEENT: Normal NECK: No JVD LYMPHATICS: No lymphadenopathy CARDIAC: Irregular, normal rate, no murmurs RESPIRATORY:  Clear to auscultation without rales, wheezing or rhonchi  ABDOMEN: Soft, non-tender, non-distended MUSCULOSKELETAL: Trace edema SKIN: Warm and dry NEUROLOGIC:  Alert and oriented x 3 PSYCHIATRIC:  Normal affect   ASSESSMENT:    No diagnosis found.  PLAN:    Frequent PVCs: 10.4% of beats on Zio patch x3 days on 04/30/2019.  Suspect alcohol use is driving his PVCs, as he drinks vodka daily.  Suspect this is also causing his hypomagnesemia, which is also likely contributing to his PVCs.  Echocardiogram on 04/28/2019 showed normal biventricular function, mild mitral regurgitation.  Repeat Zio patch x3 days on 01/21/2020 showed PVC burden less than 1%. -Continue Toprol-XL 25 mg daily -Recommend alcohol cessation.  He has a history of withdrawal (DTs).  Recommend he discuss plan to wean off alcohol with his PCP, as we will need to go slowly to avoid withdrawal  Hypertension: On clonidine 0.1 mg three times daily, losartan 100 mg daily, and Toprol-XL 25 mg daily.  BP elevated in clinic today, reports he did not take his meds.  Also reports high salt intake.  Advised on low-salt diet, asked him to check BP daily for next week and call with results.    Hypokalemia/hypomagnesemia: Initially suspected due to indapamide use, which was likely contributing.  Also suspect alcohol use is playing significant role.  Recommend weaning off alcohol as above.  Will check BMP/magnesium  Hyperlipidemia: LDL 55 on 03/17/2019.  On atorvastatin 20 mg daily.  Will  check lipid panel  Alcohol use: history of history of withdrawal (DTs).  Recommend he discuss plan to wean off alcohol with his PCP, as we will need to go slowly to avoid withdrawal  Tobacco use: smokes 0.5 ppd.  Patient counseled on the risks of tobacco use and cessation strongly encouraged.  Will ask care guide to work with patient to assist with smoking cessation  RTC in ***  Medication Adjustments/Labs and Tests Ordered: Current medicines are reviewed at length with the patient today.  Concerns regarding medicines are outlined above.  No orders of the defined types were placed in this encounter.  No orders of the defined types were placed in this encounter.   There are no Patient Instructions on file for this visit.   Signed, Doreatha Martin  Gardiner Rhyme, MD  10/02/2020 1:29 PM    [facility]

## 2020-10-03 ENCOUNTER — Ambulatory Visit: Payer: Medicare Other | Admitting: Cardiology

## 2020-10-07 ENCOUNTER — Other Ambulatory Visit: Payer: Self-pay | Admitting: Internal Medicine

## 2020-10-07 ENCOUNTER — Other Ambulatory Visit: Payer: Self-pay | Admitting: Cardiology

## 2020-10-07 DIAGNOSIS — I10 Essential (primary) hypertension: Secondary | ICD-10-CM

## 2020-10-07 DIAGNOSIS — E785 Hyperlipidemia, unspecified: Secondary | ICD-10-CM

## 2020-10-07 DIAGNOSIS — D520 Dietary folate deficiency anemia: Secondary | ICD-10-CM

## 2020-10-07 NOTE — Telephone Encounter (Signed)
This is Dr. Schumann's pt 

## 2020-11-16 ENCOUNTER — Other Ambulatory Visit: Payer: Self-pay | Admitting: Cardiology

## 2020-11-16 NOTE — Telephone Encounter (Signed)
This is Dr. Schumann's pt 

## 2020-11-28 NOTE — Progress Notes (Signed)
Cardiology Office Note:    Date:  11/29/2020   ID:  Scott Lindsey, DOB 03-21-1955, MRN [id number]  PCP:  [name], MD [place] Cardiologist: [name], MD   Reason for visit: 14-month follow-up  History of Present Illness:    Scott Lindsey is a 65 y.o. male with a hx of hyperlipidemia, hypertension, PVCs and prostate cancer.  He had a history of hypokalemia and hypomagnesia and therefore indapamide was stopped in 03/2019.  He was switched to losartan for hypertension.  Echocardiogram on 04/28/2019 showed normal biventricular function, mild mitral regurgitation.  Zio patch x3 days on 04/30/2019 showed frequent PVCs (10.4% of beats).  He was started on Toprol-XL 25 mg daily.  Dr. Gardiner Rhyme recommended alcohol cessation.  Repeat Zio patch x3 days on 01/21/2020 showed PVC burden less than 1%.  He saw Dr. Gardiner Rhyme December 2021 stated he been doing okay.  At that time, he was drinking vodka and smoking 0.5 packs/day.  Today, he denies chest pain or shortness of breath when walking up his steep driveway.  He does not notice palpitations.  He still drinks approximately 2 shots per day and is smoking half pack per day.  He states his blood pressures typically well controlled at home though cannot give me average readings at this time.  He will occasionally babysit his 58-year-old grandson.  He states Dr. Gardiner Rhyme recommended he cut back on Kool-Aid and he is drinking approximately 2 cups/day.  He denies PND, orthopnea and lower extremity edema.     Past Medical History:  Diagnosis Date   Back pain     Past Surgical History:  Procedure Laterality Date   MULTIPLE TOOTH EXTRACTIONS     ROBOT ASSISTED LAPAROSCOPIC RADICAL PROSTATECTOMY  12/13/2010   Procedure: ROBOTIC ASSISTED LAPAROSCOPIC RADICAL PROSTATECTOMY;  Surgeon: Bernestine Amass, MD;  Location: [facility];  Service: Urology;  Laterality: N/A;  with Bilateral Pelivic Lymph Node Dissection    Current  Medications: Current Meds  Medication Sig   atorvastatin (LIPITOR) 20 MG tablet Take 1 tablet by mouth once daily   cloNIDine (CATAPRES) 0.1 MG tablet TAKE 1 TABLET BY MOUTH THREE TIMES DAILY   folic acid (FOLVITE) 1 MG tablet Take 1 tablet by mouth once daily   losartan (COZAAR) 100 MG tablet Take 1 tablet by mouth once daily   magnesium oxide (MAG-OX) 400 (241.3 Mg) MG tablet Take 1 tablet (400 mg total) by mouth 2 (two) times daily.   magnesium oxide (MAG-OX) 400 MG tablet Take 400 mg by mouth 2 (two) times daily.   metoprolol succinate (TOPROL-XL) 25 MG 24 hr tablet TAKE 1 TABLET BY MOUTH ONCE DAILY . APPOINTMENT REQUIRED FOR FUTURE REFILLS   Multiple Vitamin (MULTIVITAMIN WITH MINERALS) TABS tablet Take 1 tablet by mouth daily.   thiamine (VITAMIN B-1) 50 MG tablet Take 1 tablet (50 mg total) by mouth daily.     Allergies:   Patient has no known allergies.   Social History   Socioeconomic History   Marital status: Married    Spouse name: Not on file   Number of children: Not on file   Years of education: Not on file   Highest education level: Not on file  Occupational History   Not on file  Tobacco Use   Smoking status: Every Day    Packs/day: 0.50    Years: 25.00    Pack years: 12.50    Types: Cigarettes   Smokeless tobacco: Never  Substance and  Sexual Activity   Alcohol use: Yes    Alcohol/week: 15.0 standard drinks    Types: 15 Shots of liquor per week    Comment: occasional   Drug use: No   Sexual activity: Yes    Partners: Female  Other Topics Concern   Not on file  Social History Narrative   Not on file   Social Determinants of Health   Financial Resource Strain: Not on file  Food Insecurity: Not on file  Transportation Needs: Not on file  Physical Activity: Not on file  Stress: Not on file  Social Connections: Not on file     Family History: The patient's family history includes Prostate cancer in his brother.  ROS:   Please see the history of  present illness.     EKGs/Labs/Other Studies Reviewed:    EKG:  The ekg ordered today demonstrates normal sinus rhythm, septal infarct, heart rate 81, PR interval 158 ms, QRS duration 90 ms.  Recent Labs: 05/02/2020: Magnesium 1.9 07/05/2020: ALT 22; BUN 9; Creatinine, Ser 1.05; Potassium 3.9; Sodium 138 08/09/2020: Hemoglobin 12.6; Platelets 170.0; TSH 1.45   Recent Lipid Panel Lab Results  Component Value Date/Time   CHOL 152 04/04/2020 11:41 AM   TRIG 55 04/04/2020 11:41 AM   HDL 91 04/04/2020 11:41 AM   LDLCALC 49 04/04/2020 11:41 AM    Physical Exam:    VS:  BP (!) 142/98 (BP Location: Left Arm, Patient Position: Sitting, Cuff Size: Normal)   Pulse 81   Ht 5\' 8"  (1.727 m)   Wt 145 lb 2 oz (65.8 kg)   BMI 22.07 kg/m    No data found.  Wt Readings from Last 3 Encounters:  11/29/20 145 lb 2 oz (65.8 kg)  08/09/20 144 lb (65.3 kg)  07/05/20 143 lb (64.9 kg)     GEN:  Well nourished, well developed in no acute distress, slender HEENT: Normal NECK: No JVD; No carotid bruits CARDIAC: RRR with ectopics, no murmurs, rubs, gallops RESPIRATORY:  Clear to auscultation without rales, wheezing or rhonchi  ABDOMEN: Soft, non-tender, non-distended MUSCULOSKELETAL: No edema; No deformity  SKIN: Warm and dry NEUROLOGIC:  Alert and oriented PSYCHIATRIC:  Normal affect     ASSESSMENT AND PLAN   Frequent PVCs, improved -Driven by ETOH use. -Echocardiogram on 04/28/2019 showed normal biventricular function, mild mitral regurgitation. -10.4% of beats on Zio patch x3 days on 04/30/2019.  -Started on Toprol 25mg  daily.  Repeat Zio patch x3 days on 01/21/2020 showed PVC burden less than 1%. -Continue Toprol and magnesium supplement.  Recommended decreasing alcohol use.  Hypertension, elevated today -Continue clonidine 0.1 mg three times daily, losartan 100 mg daily, and Toprol-XL 25 mg daily. -Asked to bring in 1 week log of blood pressure readings.  If blood pressure is still over  130/80, could increase his clonidine or change to stronger ARB.  Alternatively, could add amlodipine. -Recommend decreasing nicotine use. -Goal BP is <130/80.  Recommend DASH diet (high in vegetables, fruits, low-fat dairy products, whole grains, poultry, fish, and nuts and low in sweets, sugar-sweetened beverages, and red meats), salt restriction and increase physical activity.  Hyperlipidemia, well controlled. -LDL 49 in March 2022.  Continue Lipitor. -Discussed cholesterol lowering diets - Mediterranean diet, DASH diet, vegetarian diet, low-carbohydrate diet and avoidance of trans fats.  Discussed healthier choice substitutes.  Nuts, high-fiber foods, and fiber supplements may also improve lipids.    Tobacco use  -Referred to our health coach for tobacco cessation back in December 2021 -  he never followed up on this.  He will let us know when he is ready to quit. -Recommend tobacco cessation.  Reviewed physiologic effects of nicotine and the immediate-eventual benefits of quitting including improvement in cough/breathing and reduction in cardiovascular events.  Discussed quitting tips such as removing triggers and getting support from family/friends and Quitline [place]. -USPSTF recommends one-time screening for abdominal aortic aneurysm (AAA) by ultrasound in men 80 -81 years old who have ever smoked --> will order today.   Disposition - Follow-up in 6 months.    Medication Adjustments/Labs and Tests Ordered: Current medicines are reviewed at length with the patient today.  Concerns regarding medicines are outlined above.  Orders Placed This Encounter  Procedures   EKG 12-Lead   VAS [place] AAA DUPLEX    No orders of the defined types were placed in this encounter.   Patient Instructions  Medication Instructions:  No Changes *If you need a refill on your cardiac medications before your next appointment, please call your pharmacy*   Lab Work: No Labs If you have labs (blood work) drawn  today and your tests are completely normal, you will receive your results only by: [place] (if you have MyChart) OR A paper copy in the mail If you have any lab test that is abnormal or we need to change your treatment, we will call you to review the results.   Testing/Procedures: [place], [address]. Your physician has requested that you have an abdominal aorta duplex. During this test, an ultrasound is used to evaluate the aorta. Allow 30 minutes for this exam. Do not eat after midnight the day before and avoid carbonated beverages    Follow-Up: At [facility], you and your health needs are our priority.  As part of our continuing mission to provide you with exceptional heart care, we have created designated Provider Care Teams.  These Care Teams include your primary Cardiologist (physician) and Advanced Practice Providers (APPs -  Physician Assistants and Nurse Practitioners) who all work together to provide you with the care you need, when you need it.  We recommend signing up for the patient portal called "MyChart".  Sign up information is provided on this After Visit Summary.  MyChart is used to connect with patients for Virtual Visits (Telemedicine).  Patients are able to view lab/test results, encounter notes, upcoming appointments, etc.  Non-urgent messages can be sent to your provider as well.   To learn more about what you can do with MyChart, go to [email or web address].    Your next appointment:   6 month(s)  The format for your next appointment:   In Person  Provider:   [name], MD     Other Instructions Recommend Decreasing Alcohol and Tobacco Use.    Signed, Warren Lacy, PA-C  11/29/2020 10:26 AM    [place]

## 2020-11-29 ENCOUNTER — Ambulatory Visit (INDEPENDENT_AMBULATORY_CARE_PROVIDER_SITE_OTHER): Payer: Medicare Other | Admitting: Physician Assistant

## 2020-11-29 ENCOUNTER — Other Ambulatory Visit: Payer: Self-pay

## 2020-11-29 ENCOUNTER — Encounter: Payer: Self-pay | Admitting: Physician Assistant

## 2020-11-29 VITALS — BP 142/98 | HR 81 | Ht 68.0 in | Wt 145.1 lb

## 2020-11-29 DIAGNOSIS — I10 Essential (primary) hypertension: Secondary | ICD-10-CM

## 2020-11-29 DIAGNOSIS — I493 Ventricular premature depolarization: Secondary | ICD-10-CM | POA: Diagnosis not present

## 2020-11-29 DIAGNOSIS — E785 Hyperlipidemia, unspecified: Secondary | ICD-10-CM | POA: Diagnosis not present

## 2020-11-29 DIAGNOSIS — Z720 Tobacco use: Secondary | ICD-10-CM | POA: Diagnosis not present

## 2020-11-29 DIAGNOSIS — F1090 Alcohol use, unspecified, uncomplicated: Secondary | ICD-10-CM

## 2020-11-29 DIAGNOSIS — Z789 Other specified health status: Secondary | ICD-10-CM | POA: Diagnosis not present

## 2020-11-29 NOTE — Patient Instructions (Signed)
Medication Instructions:  No Changes *If you need a refill on your cardiac medications before your next appointment, please call your pharmacy*   Lab Work: No Labs If you have labs (blood work) drawn today and your tests are completely normal, you will receive your results only by: [place] (if you have MyChart) OR A paper copy in the mail If you have any lab test that is abnormal or we need to change your treatment, we will call you to review the results.   Testing/Procedures: [address], [address]. Your physician has requested that you have an abdominal aorta duplex. During this test, an ultrasound is used to evaluate the aorta. Allow 30 minutes for this exam. Do not eat after midnight the day before and avoid carbonated beverages    Follow-Up: At [facility], you and your health needs are our priority.  As part of our continuing mission to provide you with exceptional heart care, we have created designated Provider Care Teams.  These Care Teams include your primary Cardiologist (physician) and Advanced Practice Providers (APPs -  Physician Assistants and Nurse Practitioners) who all work together to provide you with the care you need, when you need it.  We recommend signing up for the patient portal called "MyChart".  Sign up information is provided on this After Visit Summary.  MyChart is used to connect with patients for Virtual Visits (Telemedicine).  Patients are able to view lab/test results, encounter notes, upcoming appointments, etc.  Non-urgent messages can be sent to your provider as well.   To learn more about what you can do with MyChart, go to [email or web address].    Your next appointment:   6 month(s)  The format for your next appointment:   In Person  Provider:   [name], MD     Other Instructions Recommend Decreasing Alcohol and Tobacco Use.

## 2020-12-09 ENCOUNTER — Other Ambulatory Visit: Payer: Self-pay | Admitting: Physician Assistant

## 2020-12-09 DIAGNOSIS — Z720 Tobacco use: Secondary | ICD-10-CM

## 2020-12-09 DIAGNOSIS — F1090 Alcohol use, unspecified, uncomplicated: Secondary | ICD-10-CM

## 2020-12-09 DIAGNOSIS — I700 Atherosclerosis of aorta: Secondary | ICD-10-CM

## 2020-12-09 DIAGNOSIS — I10 Essential (primary) hypertension: Secondary | ICD-10-CM

## 2020-12-09 DIAGNOSIS — Z789 Other specified health status: Secondary | ICD-10-CM

## 2020-12-09 DIAGNOSIS — E785 Hyperlipidemia, unspecified: Secondary | ICD-10-CM

## 2020-12-20 ENCOUNTER — Other Ambulatory Visit: Payer: Self-pay | Admitting: Cardiology

## 2020-12-21 ENCOUNTER — Other Ambulatory Visit: Payer: Self-pay

## 2020-12-21 ENCOUNTER — Ambulatory Visit (HOSPITAL_COMMUNITY)
Admission: RE | Admit: 2020-12-21 | Discharge: 2020-12-21 | Disposition: A | Discharge status: Home / Self Care | Payer: Medicare Other | Source: Ambulatory Visit | Attending: Cardiology | Admitting: Cardiology

## 2020-12-21 DIAGNOSIS — E785 Hyperlipidemia, unspecified: Secondary | ICD-10-CM

## 2020-12-21 DIAGNOSIS — Z789 Other specified health status: Secondary | ICD-10-CM | POA: Insufficient documentation

## 2020-12-21 DIAGNOSIS — Z720 Tobacco use: Secondary | ICD-10-CM | POA: Diagnosis not present

## 2020-12-21 DIAGNOSIS — I10 Essential (primary) hypertension: Secondary | ICD-10-CM | POA: Diagnosis not present

## 2020-12-21 DIAGNOSIS — I700 Atherosclerosis of aorta: Secondary | ICD-10-CM | POA: Insufficient documentation

## 2020-12-22 ENCOUNTER — Telehealth: Payer: Self-pay

## 2020-12-22 NOTE — Telephone Encounter (Addendum)
Attempted to call patient regarding results. Unable to to leave message.----- Message from Warren Lacy, PA-C sent at 12/21/2020  9:13 PM EST ----- No evidence of abdominal aortic aneurysm. However, there is evidence of plaque in the aorta and high leg arteries.  It is imperative that you quit smoking to avoid having potentially limb threatening blockages (I.e. risk of leg amputation).   --> Continue your cholesterol medicine and add a baby aspirin. --> Is important to continue walking daily. --> If you have leg pain/cramping worse with walking, we can consider referring you to one of our vascular specialists like Dr. Gwenlyn Found.  We can further discuss at your next follow-up.

## 2020-12-28 ENCOUNTER — Telehealth: Payer: Self-pay

## 2020-12-28 NOTE — Telephone Encounter (Addendum)
Spoke with patient regarding results.----- Message from Warren Lacy, PA-C sent at 12/21/2020  9:13 PM EST ----- No evidence of abdominal aortic aneurysm. However, there is evidence of plaque in the aorta and high leg arteries.  It is imperative that you quit smoking to avoid having potentially limb threatening blockages (I.e. risk of leg amputation).   --> Continue your cholesterol medicine and add a baby aspirin. --> Is important to continue walking daily. --> If you have leg pain/cramping worse with walking, we can consider referring you to one of our vascular specialists like Dr. Gwenlyn Found.  We can further discuss at your next follow-up.

## 2021-01-18 ENCOUNTER — Other Ambulatory Visit: Payer: Self-pay | Admitting: Internal Medicine

## 2021-01-18 DIAGNOSIS — D520 Dietary folate deficiency anemia: Secondary | ICD-10-CM

## 2021-01-18 DIAGNOSIS — E785 Hyperlipidemia, unspecified: Secondary | ICD-10-CM

## 2021-02-10 ENCOUNTER — Other Ambulatory Visit: Payer: Self-pay | Admitting: Cardiology

## 2021-03-21 ENCOUNTER — Other Ambulatory Visit: Payer: Self-pay | Admitting: Cardiology

## 2021-03-21 DIAGNOSIS — U071 COVID-19: Secondary | ICD-10-CM | POA: Diagnosis not present

## 2021-04-10 ENCOUNTER — Other Ambulatory Visit: Payer: Self-pay

## 2021-04-10 ENCOUNTER — Emergency Department (HOSPITAL_COMMUNITY): Payer: Medicare Other

## 2021-04-10 ENCOUNTER — Encounter (HOSPITAL_COMMUNITY): Payer: Self-pay | Admitting: Oncology

## 2021-04-10 ENCOUNTER — Inpatient Hospital Stay (HOSPITAL_COMMUNITY)
Admission: EM | Admit: 2021-04-10 | Discharge: 2021-04-12 | DRG: 440 | Disposition: A | Discharge status: Home / Self Care | Payer: Medicare Other | Attending: Internal Medicine | Admitting: Internal Medicine

## 2021-04-10 DIAGNOSIS — D649 Anemia, unspecified: Secondary | ICD-10-CM | POA: Diagnosis present

## 2021-04-10 DIAGNOSIS — I10 Essential (primary) hypertension: Secondary | ICD-10-CM | POA: Diagnosis present

## 2021-04-10 DIAGNOSIS — D696 Thrombocytopenia, unspecified: Secondary | ICD-10-CM | POA: Diagnosis present

## 2021-04-10 DIAGNOSIS — Z9079 Acquired absence of other genital organ(s): Secondary | ICD-10-CM

## 2021-04-10 DIAGNOSIS — K859 Acute pancreatitis without necrosis or infection, unspecified: Secondary | ICD-10-CM | POA: Diagnosis present

## 2021-04-10 DIAGNOSIS — R1032 Left lower quadrant pain: Secondary | ICD-10-CM | POA: Diagnosis not present

## 2021-04-10 DIAGNOSIS — R109 Unspecified abdominal pain: Secondary | ICD-10-CM | POA: Diagnosis not present

## 2021-04-10 DIAGNOSIS — Z79899 Other long term (current) drug therapy: Secondary | ICD-10-CM

## 2021-04-10 DIAGNOSIS — R0902 Hypoxemia: Secondary | ICD-10-CM | POA: Diagnosis not present

## 2021-04-10 DIAGNOSIS — K852 Alcohol induced acute pancreatitis without necrosis or infection: Principal | ICD-10-CM | POA: Diagnosis present

## 2021-04-10 DIAGNOSIS — E519 Thiamine deficiency, unspecified: Secondary | ICD-10-CM

## 2021-04-10 DIAGNOSIS — I700 Atherosclerosis of aorta: Secondary | ICD-10-CM | POA: Diagnosis not present

## 2021-04-10 DIAGNOSIS — Z8546 Personal history of malignant neoplasm of prostate: Secondary | ICD-10-CM

## 2021-04-10 DIAGNOSIS — E785 Hyperlipidemia, unspecified: Secondary | ICD-10-CM | POA: Diagnosis present

## 2021-04-10 DIAGNOSIS — F1010 Alcohol abuse, uncomplicated: Secondary | ICD-10-CM | POA: Diagnosis present

## 2021-04-10 DIAGNOSIS — Z8042 Family history of malignant neoplasm of prostate: Secondary | ICD-10-CM

## 2021-04-10 DIAGNOSIS — F1090 Alcohol use, unspecified, uncomplicated: Secondary | ICD-10-CM | POA: Diagnosis present

## 2021-04-10 DIAGNOSIS — F17210 Nicotine dependence, cigarettes, uncomplicated: Secondary | ICD-10-CM | POA: Diagnosis present

## 2021-04-10 DIAGNOSIS — R112 Nausea with vomiting, unspecified: Secondary | ICD-10-CM | POA: Diagnosis not present

## 2021-04-10 DIAGNOSIS — Z720 Tobacco use: Secondary | ICD-10-CM | POA: Diagnosis present

## 2021-04-10 DIAGNOSIS — R1084 Generalized abdominal pain: Secondary | ICD-10-CM | POA: Diagnosis not present

## 2021-04-10 LAB — COMPREHENSIVE METABOLIC PANEL
ALT: 37 U/L (ref 0–44)
AST: 66 U/L — ABNORMAL HIGH (ref 15–41)
Albumin: 3.9 g/dL (ref 3.5–5.0)
Alkaline Phosphatase: 97 U/L (ref 38–126)
Anion gap: 14 (ref 5–15)
BUN: 14 mg/dL (ref 8–23)
CO2: 22 mmol/L (ref 22–32)
Calcium: 9.3 mg/dL (ref 8.9–10.3)
Chloride: 101 mmol/L (ref 98–111)
Creatinine, Ser: 1.1 mg/dL (ref 0.61–1.24)
GFR, Estimated: >60 mL/min (ref >60)
Glucose, Bld: 116 mg/dL — ABNORMAL HIGH (ref 70–99)
Potassium: 3.9 mmol/L (ref 3.5–5.1)
Sodium: 137 mmol/L (ref 135–145)
Total Bilirubin: 0.6 mg/dL (ref 0.3–1.2)
Total Protein: 8.3 g/dL — ABNORMAL HIGH (ref 6.5–8.1)

## 2021-04-10 LAB — URINALYSIS, ROUTINE W REFLEX MICROSCOPIC
Bilirubin Urine: NEGATIVE
Glucose, UA: NEGATIVE mg/dL
Hgb urine dipstick: NEGATIVE
Ketones, ur: 5 mg/dL — AB
Leukocytes,Ua: NEGATIVE
Nitrite: NEGATIVE
Protein, ur: NEGATIVE mg/dL
Specific Gravity, Urine: 1.01 (ref 1.005–1.030)
pH: 9 — ABNORMAL HIGH (ref 5.0–8.0)

## 2021-04-10 LAB — CBC
HCT: 36.2 % — ABNORMAL LOW (ref 39.0–52.0)
Hemoglobin: 12.4 g/dL — ABNORMAL LOW (ref 13.0–17.0)
MCH: 33.7 pg (ref 26.0–34.0)
MCHC: 34.3 g/dL (ref 30.0–36.0)
MCV: 98.4 fL (ref 80.0–100.0)
Platelets: 145 10*3/uL — ABNORMAL LOW (ref 150–400)
RBC: 3.68 MIL/uL — ABNORMAL LOW (ref 4.22–5.81)
RDW: 14.4 % (ref 11.5–15.5)
WBC: 7.1 10*3/uL (ref 4.0–10.5)
nRBC: 0 % (ref 0.0–0.2)

## 2021-04-10 LAB — LIPASE, BLOOD: Lipase: 474 U/L — ABNORMAL HIGH (ref 11–51)

## 2021-04-10 MED ORDER — OXYCODONE HCL 5 MG PO TABS: 5.0000 mg | ORAL_TABLET | ORAL | Status: DC | PRN

## 2021-04-10 MED ORDER — MORPHINE SULFATE (PF) 4 MG/ML IV SOLN: 4.0000 mg | INTRAVENOUS | Status: DC | PRN

## 2021-04-10 MED ORDER — ONDANSETRON HCL 4 MG/2ML IJ SOLN
4.0000 mg | Freq: Once | INTRAMUSCULAR | Status: AC | PRN
  Administered 2021-04-10: 4 mg via INTRAVENOUS
  Filled 2021-04-10: qty 2

## 2021-04-10 MED ORDER — FOLIC ACID 1 MG PO TABS
1.0000 mg | ORAL_TABLET | Freq: Every day | ORAL | Status: DC
  Administered 2021-04-10 – 2021-04-12 (×3): 1 mg via ORAL
  Filled 2021-04-10 (×3): qty 1

## 2021-04-10 MED ORDER — METOPROLOL SUCCINATE ER 25 MG PO TB24
25.0000 mg | ORAL_TABLET | Freq: Every day | ORAL | Status: DC
  Administered 2021-04-10 – 2021-04-12 (×3): 25 mg via ORAL
  Filled 2021-04-10 (×3): qty 1

## 2021-04-10 MED ORDER — FENTANYL CITRATE PF 50 MCG/ML IJ SOSY
50.0000 ug | PREFILLED_SYRINGE | INTRAMUSCULAR | Status: AC | PRN
  Administered 2021-04-10 (×2): 50 ug via INTRAVENOUS
  Filled 2021-04-10 (×2): qty 1

## 2021-04-10 MED ORDER — LACTATED RINGERS IV BOLUS
1000.0000 mL | Freq: Once | INTRAVENOUS | Status: AC
Start: 2021-04-10 — End: 2021-04-10
  Administered 2021-04-10: 1000 mL via INTRAVENOUS

## 2021-04-10 MED ORDER — CLONIDINE HCL 0.1 MG PO TABS
0.1000 mg | ORAL_TABLET | Freq: Three times a day (TID) | ORAL | Status: DC
  Administered 2021-04-10 – 2021-04-12 (×5): 0.1 mg via ORAL
  Filled 2021-04-10 (×5): qty 1

## 2021-04-10 MED ORDER — LORAZEPAM 2 MG/ML IJ SOLN: 1.0000 mg | INTRAMUSCULAR | Status: DC | PRN

## 2021-04-10 MED ORDER — ADULT MULTIVITAMIN W/MINERALS CH
1.0000 | ORAL_TABLET | Freq: Every day | ORAL | Status: DC
  Administered 2021-04-11 – 2021-04-12 (×2): 1 via ORAL
  Filled 2021-04-10 (×2): qty 1

## 2021-04-10 MED ORDER — THIAMINE HCL 100 MG PO TABS
100.0000 mg | ORAL_TABLET | Freq: Every day | ORAL | Status: DC
  Administered 2021-04-11 – 2021-04-12 (×2): 100 mg via ORAL
  Filled 2021-04-10 (×2): qty 1

## 2021-04-10 MED ORDER — IOHEXOL 300 MG/ML  SOLN
100.0000 mL | Freq: Once | INTRAMUSCULAR | Status: AC | PRN
Start: 2021-04-10 — End: 2021-04-10
  Administered 2021-04-10: 100 mL via INTRAVENOUS

## 2021-04-10 MED ORDER — PROCHLORPERAZINE EDISYLATE 10 MG/2ML IJ SOLN
10.0000 mg | Freq: Four times a day (QID) | INTRAMUSCULAR | Status: DC | PRN
Start: 2021-04-10 — End: 2021-04-12

## 2021-04-10 MED ORDER — LACTATED RINGERS IV SOLN: INTRAVENOUS | Status: DC

## 2021-04-10 MED ORDER — ATORVASTATIN CALCIUM 20 MG PO TABS
20.0000 mg | ORAL_TABLET | Freq: Every day | ORAL | Status: DC
Start: 2021-04-10 — End: 2021-04-12
  Administered 2021-04-10 – 2021-04-12 (×3): 20 mg via ORAL
  Filled 2021-04-10 (×3): qty 1

## 2021-04-10 MED ORDER — MORPHINE SULFATE (PF) 4 MG/ML IV SOLN
4.0000 mg | Freq: Once | INTRAVENOUS | Status: AC
  Administered 2021-04-10: 4 mg via INTRAVENOUS
  Filled 2021-04-10: qty 1

## 2021-04-10 MED ORDER — NICOTINE 14 MG/24HR TD PT24
14.0000 mg | MEDICATED_PATCH | Freq: Every day | TRANSDERMAL | Status: DC
  Administered 2021-04-10 – 2021-04-12 (×3): 14 mg via TRANSDERMAL
  Filled 2021-04-10 (×3): qty 1

## 2021-04-10 MED ORDER — LORAZEPAM 1 MG PO TABS: 1.0000 mg | ORAL_TABLET | ORAL | Status: DC | PRN

## 2021-04-10 MED ORDER — LOSARTAN POTASSIUM 50 MG PO TABS
100.0000 mg | ORAL_TABLET | Freq: Every day | ORAL | Status: DC
  Administered 2021-04-10 – 2021-04-12 (×3): 100 mg via ORAL
  Filled 2021-04-10 (×3): qty 2

## 2021-04-10 MED ORDER — ENOXAPARIN SODIUM 40 MG/0.4ML IJ SOSY
40.0000 mg | PREFILLED_SYRINGE | INTRAMUSCULAR | Status: DC
  Administered 2021-04-10 – 2021-04-11 (×2): 40 mg via SUBCUTANEOUS
  Filled 2021-04-10 (×2): qty 0.4

## 2021-04-10 MED ORDER — THIAMINE HCL 100 MG/ML IJ SOLN
100.0000 mg | Freq: Every day | INTRAMUSCULAR | Status: DC
  Administered 2021-04-10: 100 mg via INTRAVENOUS
  Filled 2021-04-10: qty 2

## 2021-04-10 NOTE — ED Triage Notes (Signed)
Pt bib [facility] from home d/t left abdominal pain.  Per pt's wife pt w/ hx of pancreatitis w/ approximately one episode yearly.  Pt uses ETOH daily.  Pt found to be in bigeminy per EMS on arrival to ED.   ?

## 2021-04-10 NOTE — H&P (Addendum)
?History and Physical  ? ? ?Patient: Scott Lindsey:[id number] DOB: 12/07/1955 ?DOA: 04/10/2021 ?DOS: the patient was seen and examined on 04/10/2021 ?PCP: [name], MD  ?Patient coming from: Home ? ?Chief Complaint:  ?Chief Complaint  ?Patient presents with  ? Abdominal Pain  ? ?HPI: Scott Lindsey is a 66 y.o. male with medical history significant of EtOH abuse, tobacco abuse, HTN, HLD. Presenting with abdominal pain. He woke up with a crampy feeling in his stomach this morning. It was located in the LUQ. It intensified as the day continued. He didn't try any medicines to help. He had N/V, but no fever or diarrhea. When his symptoms didn't improve this morning, he decided to come to the ED for assistance. He reports daily alcohol use. Otherwise, he denies any alleviating or aggravating factors.   ? ?Review of Systems: As mentioned in the history of present illness. All other systems reviewed and are negative. ?Past Medical History:  ?Diagnosis Date  ? Back pain   ? ?Past Surgical History:  ?Procedure Laterality Date  ? MULTIPLE TOOTH EXTRACTIONS    ? ROBOT ASSISTED LAPAROSCOPIC RADICAL PROSTATECTOMY  12/13/2010  ? Procedure: ROBOTIC ASSISTED LAPAROSCOPIC RADICAL PROSTATECTOMY;  Surgeon: Bernestine Amass, MD;  Location: [facility];  Service: Urology;  Laterality: N/A;  with Bilateral Pelivic Lymph Node Dissection  ? ?Social History:  reports that he has been smoking cigarettes. He has a 12.50 pack-year smoking history. He has never used smokeless tobacco. He reports current alcohol use of about 15.0 standard drinks per week. He reports that he does not use drugs. ? ?No Known Allergies ? ?Family History  ?Problem Relation Age of Onset  ? Prostate cancer Brother   ? ? ?Prior to Admission medications   ?Medication Sig Start Date End Date Taking? Authorizing Provider  ?atorvastatin (LIPITOR) 20 MG tablet Take 1 tablet by mouth once daily 01/18/21  Yes [name], MD  ?cloNIDine (CATAPRES) 0.1 MG tablet TAKE 1 TABLET  BY MOUTH THREE TIMES DAILY 10/07/20  Yes [name], MD  ?folic acid (FOLVITE) 1 MG tablet Take 1 tablet by mouth once daily 01/18/21  Yes [name], MD  ?losartan (COZAAR) 100 MG tablet TAKE 1 TABLET BY MOUTH ONCE DAILY . APPOINTMENT REQUIRED FOR FUTURE REFILLS 03/21/21  Yes [name], MD  ?magnesium oxide (MAG-OX) 400 (241.3 Mg) MG tablet Take 1 tablet (400 mg total) by mouth 2 (two) times daily. 04/05/20  Yes [name], MD  ?metoprolol succinate (TOPROL-XL) 25 MG 24 hr tablet Take 1 tablet (25 mg total) by mouth daily. 12/20/20  Yes [name], MD  ?Multiple Vitamin (MULTIVITAMIN WITH MINERALS) TABS tablet Take 1 tablet by mouth daily.   Yes [provider]  ?thiamine (VITAMIN B-1) 50 MG tablet Take 1 tablet (50 mg total) by mouth daily. 09/26/17  Yes [name], MD  ? ? ?Physical Exam: ?Vitals:  ? 04/10/21 1216 04/10/21 1218 04/10/21 1403  ?BP: (!) 171/83  (!) 155/89  ?Pulse: 92  92  ?Resp: (!) 24  20  ?Temp: 98.1 ?F (36.7 ?C)    ?TempSrc: Oral    ?SpO2: 100%  100%  ?Weight:  63.5 kg   ?Height:  '5\' 8"'$  (1.727 m)   ? ?General: 66 y.o. male resting in bed in NAD ?Eyes: PERRL, normal sclera ?ENMT: Nares patent w/o discharge, orophaynx clear, dentition normal, ears w/o discharge/lesions/ulcers ?Neck: Supple, trachea midline ?Cardiovascular: tachy, +S1, S2, no m/g/r, equal pulses  throughout ?Respiratory: CTABL, no w/r/r, normal WOB ?GI: BS+, ND, mild TTP epigastric, no masses noted, no organomegaly noted ?MSK: No e/c/c ?Neuro: A&O x 3, no focal deficits ?Psyc: Appropriate interaction and affect, calm/cooperative ? ?Data Reviewed: ? ?Glucose  116 ?Lipase  474 ?AST  66 ?Hgb 12.4 ?WBC  7.1 ? ?CT ab/pelvis:  ?Acute pancreatitis of the head and proximal body. No pseudocyst or abscess. Chronic emphysematous and fibrotic changes at the lung bases. Aortic atherosclerosis, advanced. ?Small nonobstructing renal calculi and/or vascular calcifications. ? ?Assessment and  Plan: ?No notes have been filed under this hospital service. ?Service: Hospitalist ?Acute pancreatitis ?   - admit to inpt, progressive ?   - fluids, pain control, anti-emetics ?   - NPO for now ?   - EtOH is likely etiology ? ?EtOH abuse ?    - counsel against further use ?    - CIWA protocol ? ?Normocytic anemia ?     - stable, no evidence of blood, follow    ? ?HTN ?    - continue home regimen when off NPO status, will have PRN meds available ? ?HLD ?    - continue home regimen when off NPO status ? ?Tobacco Abuse ?    - counsel against further use ?    - nicotine patch available ? ? Advance Care Planning:   Code Status: FULL ? ?Consults: None ? ?Family Communication: None ? ?Severity of Illness: ?The appropriate patient status for this patient is INPATIENT. Inpatient status is judged to be reasonable and necessary in order to provide the required intensity of service to ensure the patient's safety. The patient's presenting symptoms, physical exam findings, and initial radiographic and laboratory data in the context of their chronic comorbidities is felt to place them at high risk for further clinical deterioration. Furthermore, it is not anticipated that the patient will be medically stable for discharge from the hospital within 2 midnights of admission.  ? ?* I certify that at the point of admission it is my clinical judgment that the patient will require inpatient hospital care spanning beyond 2 midnights from the point of admission due to high intensity of service, high risk for further deterioration and high frequency of surveillance required.* ? ?Author: ?Jonnie Finner, DO ?04/10/2021 3:19 PM ? ?For on call review [email or web address].  ?

## 2021-04-10 NOTE — Progress Notes (Signed)
Pt arrived to unit on stretcher from ED. Ambulated to bed w/ CGA, slightly unsteady. VSS. Oriented to callbell and environment. POC discussed. All questions answered.  ?

## 2021-04-10 NOTE — Plan of Care (Signed)
  Problem: Activity: Goal: Risk for activity intolerance will decrease Outcome: Progressing   Problem: Pain Managment: Goal: General experience of comfort will improve Outcome: Progressing   Problem: Safety: Goal: Ability to remain free from injury will improve Outcome: Progressing   

## 2021-04-10 NOTE — ED Notes (Signed)
Pt aware urine sample needed. Urinal at bedside.

## 2021-04-10 NOTE — ED Provider Notes (Signed)
?[facility] DEPT ?Provider Note ? ? ?CSN: [id number] ?Arrival date & time: 04/10/21  1204 ? ?  ? ?History ? ?Chief Complaint  ?Patient presents with  ? Abdominal Pain  ? ? ?Scott Lindsey is a 66 y.o. male. ? ?Patient is a 66 year old male with a history of hypertension, ongoing alcohol use and prior pancreatitis who is presenting today with complaints of abdominal pain.  Patient reports that the pain started this morning after he had woken up and gradually worsened throughout the day.  He has had some nausea and dry heaving associated with it and denies any diarrhea.  Patient reports that he drinks daily and last had something to drink yesterday.  He typically will drink 1 or 2 beers per day and goes through 1/5 of vodka over 2 or 3 days.  He denies any new medications.  He denies any blood in his emesis.  He has not had new cough or shortness of breath. ? ?The history is provided by the patient and medical records.  ?Abdominal Pain ? ?  ? ?Home Medications ?Prior to Admission medications   ?Medication Sig Start Date End Date Taking? Authorizing Provider  ?atorvastatin (LIPITOR) 20 MG tablet Take 1 tablet by mouth once daily 01/18/21   [name], MD  ?cloNIDine (CATAPRES) 0.1 MG tablet TAKE 1 TABLET BY MOUTH THREE TIMES DAILY 10/07/20   [name], MD  ?folic acid (FOLVITE) 1 MG tablet Take 1 tablet by mouth once daily 01/18/21   [name], MD  ?losartan (COZAAR) 100 MG tablet TAKE 1 TABLET BY MOUTH ONCE DAILY . APPOINTMENT REQUIRED FOR FUTURE REFILLS 03/21/21   [name], MD  ?magnesium oxide (MAG-OX) 400 (241.3 Mg) MG tablet Take 1 tablet (400 mg total) by mouth 2 (two) times daily. 04/05/20   [name], MD  ?magnesium oxide (MAG-OX) 400 MG tablet Take 400 mg by mouth 2 (two) times daily. 04/06/20   [provider]  ?metoprolol succinate (TOPROL-XL) 25 MG 24 hr tablet Take 1 tablet (25 mg total) by mouth daily. 12/20/20   [name], MD  ?Multiple Vitamin (MULTIVITAMIN WITH MINERALS) TABS tablet Take 1 tablet by mouth daily.    [provider]  ?thiamine (VITAMIN B-1) 50 MG tablet Take 1 tablet (50 mg total) by mouth daily. 09/26/17   [name], MD  ?   ? ?Allergies    ?Patient has no known allergies.   ? ?Review of Systems   ?Review of Systems  ?Gastrointestinal:  Positive for abdominal pain.  ? ?Physical Exam ?Updated Vital Signs ?BP (!) 155/89   Pulse 92   Temp 98.1 ?F (36.7 ?C) (Oral)   Resp 20   Ht '5\' 8"'$  (1.727 m)   Wt 63.5 kg   SpO2 100%   BMI 21.29 kg/m?  ?Physical Exam ?Vitals and nursing note reviewed.  ?Constitutional:   ?   General: He is not in acute distress. ?   Appearance: He is well-developed.  ?HENT:  ?   Head: Normocephalic and atraumatic.  ?Eyes:  ?   Conjunctiva/sclera: Conjunctivae normal.  ?   Pupils: Pupils are equal, round, and reactive to light.  ?Cardiovascular:  ?   Rate and Rhythm: Normal rate and regular rhythm.  ?   Pulses: Normal pulses.  ?   Heart sounds: No murmur heard. ?Pulmonary:  ?   Effort: Pulmonary effort is normal. No respiratory distress.  ?   Breath sounds: Normal  breath sounds. No wheezing or rales.  ?Abdominal:  ?   General: There is no distension.  ?   Palpations: Abdomen is soft.  ?   Tenderness: There is abdominal tenderness in the epigastric area and left upper quadrant. There is no guarding or rebound.  ?Musculoskeletal:     ?   General: No tenderness. Normal range of motion.  ?   Cervical back: Normal range of motion and neck supple.  ?   Right lower leg: No edema.  ?   Left lower leg: No edema.  ?Skin: ?   General: Skin is warm and dry.  ?   Findings: No erythema or rash.  ?   Comments: Skin lesions consistent with spider angiomata  ?Neurological:  ?   Mental Status: He is alert and oriented to person, place, and time. Mental status is at baseline.  ?Psychiatric:     ?   Mood and Affect: Mood normal.     ?   Behavior: Behavior normal.  ? ? ?ED Results /  Procedures / Treatments   ?Labs ?(all labs ordered are listed, but only abnormal results are displayed) ?Labs Reviewed  ?LIPASE, BLOOD - Abnormal; Notable for the following components:  ?    Result Value  ? Lipase 474 (*)   ? All other components within normal limits  ?COMPREHENSIVE METABOLIC PANEL - Abnormal; Notable for the following components:  ? Glucose, Bld 116 (*)   ? Total Protein 8.3 (*)   ? AST 66 (*)   ? All other components within normal limits  ?CBC - Abnormal; Notable for the following components:  ? RBC 3.68 (*)   ? Hemoglobin 12.4 (*)   ? HCT 36.2 (*)   ? Platelets 145 (*)   ? All other components within normal limits  ?URINALYSIS, ROUTINE W REFLEX MICROSCOPIC - Abnormal; Notable for the following components:  ? pH 9.0 (*)   ? Ketones, ur 5 (*)   ? All other components within normal limits  ? ? ?EKG ?EKG Interpretation ? ?Date/Time:  Monday April 10 2021 12:17:40 EDT ?Ventricular Rate:  69 ?PR Interval:  163 ?QRS Duration: 100 ?QT Interval:  433 ?QTC Calculation: 464 ?R Axis:   45 ?Text Interpretation: Sinus rhythm Ventricular premature complex No significant change since last tracing Confirmed by Blanchie Dessert [phone]) on 04/10/2021 12:53:50 PM ? ?Radiology ?CT ABDOMEN PELVIS W CONTRAST ? ?Result Date: 04/10/2021 ?CLINICAL DATA:  Abdominal pain, acute, nonlocalized. History of pancreatitis. EXAM: CT ABDOMEN AND PELVIS WITH CONTRAST TECHNIQUE: Multidetector CT imaging of the abdomen and pelvis was performed using the standard protocol following bolus administration of intravenous contrast. RADIATION DOSE REDUCTION: This exam was performed according to the departmental dose-optimization program which includes automated exposure control, adjustment of the mA and/or kV according to patient size and/or use of iterative reconstruction technique. CONTRAST:  162m OMNIPAQUE IOHEXOL 300 MG/ML  SOLN COMPARISON:  06/26/2020 FINDINGS: Lower chest: Chronic fibrotic and emphysematous change at the lung bases,  right worse than left. No evidence of acute consolidation or pleural effusion. Hepatobiliary: Liver parenchyma is normal.  No calcified gallstones. Pancreas: Acute inflammatory change of the body and head of the pancreas with surrounding edema. No evidence of pseudocyst or abscess. Spleen: Normal Adrenals/Urinary Tract: Adrenal glands are normal. Mild atrophic change of the right kidney. Several small calcifications which could be vascular or small nonobstructing stones. The left kidney also shows scattered calcifications that could be vascular or small stones. No hydronephrosis on either side. The  bladder is normal. Stomach/Bowel: Stomach and small intestine are normal. Normal appendix. No colon pathology. Vascular/Lymphatic: Aortic atherosclerosis, pronounced. No aneurysm. IVC is normal. No adenopathy. Reproductive: Normal Other: No free fluid or air. Musculoskeletal: Chronic lumbar degenerative changes. IMPRESSION: Acute pancreatitis of the head and proximal body. No pseudocyst or abscess. Chronic emphysematous and fibrotic changes at the lung bases. Aortic atherosclerosis, advanced. Small nonobstructing renal calculi and/or vascular calcifications. Electronically Signed   By: Nelson Chimes M.D.   On: 04/10/2021 14:43   ? ?Procedures ?Procedures  ? ? ?Medications Ordered in ED ?Medications  ?lactated ringers infusion (has no administration in time range)  ?morphine (PF) 4 MG/ML injection 4 mg (has no administration in time range)  ?ondansetron (ZOFRAN) injection 4 mg (4 mg Intravenous Given 04/10/21 1226)  ?fentaNYL (SUBLIMAZE) injection 50 mcg (50 mcg Intravenous Given 04/10/21 1302)  ?lactated ringers bolus 1,000 mL (1,000 mLs Intravenous New Bag/Given 04/10/21 1416)  ?iohexol (OMNIPAQUE) 300 MG/ML solution 100 mL (100 mLs Intravenous Contrast Given 04/10/21 1423)  ? ? ?ED Course/ Medical Decision Making/ A&P ?  ?                        ?Medical Decision Making ?Amount and/or Complexity of Data Reviewed ?External Data  Reviewed: notes. ?Labs: ordered. Decision-making details documented in ED Course. ?Radiology: ordered and independent interpretation performed. Decision-making details documented in ED Course. ?ECG/medicine tests: ordered an

## 2021-04-11 DIAGNOSIS — I10 Essential (primary) hypertension: Secondary | ICD-10-CM

## 2021-04-11 DIAGNOSIS — K852 Alcohol induced acute pancreatitis without necrosis or infection: Secondary | ICD-10-CM | POA: Diagnosis not present

## 2021-04-11 DIAGNOSIS — E785 Hyperlipidemia, unspecified: Secondary | ICD-10-CM | POA: Diagnosis not present

## 2021-04-11 DIAGNOSIS — D649 Anemia, unspecified: Secondary | ICD-10-CM

## 2021-04-11 DIAGNOSIS — F1010 Alcohol abuse, uncomplicated: Secondary | ICD-10-CM | POA: Diagnosis not present

## 2021-04-11 LAB — COMPREHENSIVE METABOLIC PANEL
ALT: 29 U/L (ref 0–44)
AST: 46 U/L — ABNORMAL HIGH (ref 15–41)
Albumin: 3.6 g/dL (ref 3.5–5.0)
Alkaline Phosphatase: 81 U/L (ref 38–126)
Anion gap: 7 (ref 5–15)
BUN: 13 mg/dL (ref 8–23)
CO2: 26 mmol/L (ref 22–32)
Calcium: 9 mg/dL (ref 8.9–10.3)
Chloride: 103 mmol/L (ref 98–111)
Creatinine, Ser: 1.25 mg/dL — ABNORMAL HIGH (ref 0.61–1.24)
GFR, Estimated: >60 mL/min (ref >60)
Glucose, Bld: 83 mg/dL (ref 70–99)
Potassium: 3.7 mmol/L (ref 3.5–5.1)
Sodium: 136 mmol/L (ref 135–145)
Total Bilirubin: 0.9 mg/dL (ref 0.3–1.2)
Total Protein: 7.6 g/dL (ref 6.5–8.1)

## 2021-04-11 LAB — CBC
HCT: 34.3 % — ABNORMAL LOW (ref 39.0–52.0)
Hemoglobin: 11.7 g/dL — ABNORMAL LOW (ref 13.0–17.0)
MCH: 34.1 pg — ABNORMAL HIGH (ref 26.0–34.0)
MCHC: 34.1 g/dL (ref 30.0–36.0)
MCV: 100 fL (ref 80.0–100.0)
Platelets: 129 10*3/uL — ABNORMAL LOW (ref 150–400)
RBC: 3.43 MIL/uL — ABNORMAL LOW (ref 4.22–5.81)
RDW: 14.7 % (ref 11.5–15.5)
WBC: 6.9 10*3/uL (ref 4.0–10.5)
nRBC: 0 % (ref 0.0–0.2)

## 2021-04-11 LAB — HIV ANTIBODY (ROUTINE TESTING W REFLEX): HIV Screen 4th Generation wRfx: NONREACTIVE

## 2021-04-11 MED ORDER — MORPHINE SULFATE (PF) 4 MG/ML IV SOLN: 4.0000 mg | INTRAVENOUS | Status: DC | PRN

## 2021-04-11 MED ORDER — CHLORDIAZEPOXIDE HCL 25 MG PO CAPS: 25.0000 mg | ORAL_CAPSULE | ORAL | Status: DC

## 2021-04-11 MED ORDER — CHLORDIAZEPOXIDE HCL 25 MG PO CAPS: 25.0000 mg | ORAL_CAPSULE | Freq: Every day | ORAL | Status: DC

## 2021-04-11 MED ORDER — CHLORDIAZEPOXIDE HCL 25 MG PO CAPS
25.0000 mg | ORAL_CAPSULE | Freq: Four times a day (QID) | ORAL | Status: AC
  Administered 2021-04-11 – 2021-04-12 (×4): 25 mg via ORAL
  Filled 2021-04-11: qty 5
  Filled 2021-04-11: qty 1
  Filled 2021-04-11: qty 5
  Filled 2021-04-11: qty 1

## 2021-04-11 MED ORDER — CHLORDIAZEPOXIDE HCL 25 MG PO CAPS
25.0000 mg | ORAL_CAPSULE | Freq: Three times a day (TID) | ORAL | Status: DC
  Administered 2021-04-12: 25 mg via ORAL
  Filled 2021-04-11: qty 1

## 2021-04-11 NOTE — Progress Notes (Signed)
? ?PROGRESS NOTE ? ? ? ?IMER FOXWORTH  WEX:[id number] DOB: 27-Sep-1955 DOA: 04/10/2021 ?PCP: [name], MD ? ? ?Brief Narrative: ?Scott Lindsey is a 66 y.o. male with a history of alcohol abuse, tobacco use, hypertension, hyperlipidemia, prostate cancer s/p radical prostatectomy. Patient presented secondary to abdominal pain and found to have evidence of acute uncomplicated pancreatitis. Bowel rest initiated in addition to analgesic support. ? ? ?Assessment and Plan: ?* Acute pancreatitis ?Likely precipitated by alcohol use. Patient with symptoms, elevated lipase of 474 and CT evidence of uncomplicated pancreatitis. Calcium of 9. Patient made NPO with ice chips on admission and started on analgesics for pain management. Symptoms are improving. ?-Advance to clear liquid diet this morning/afternoon. Possibly to full liquid diet this evening if no recurrent abdominal pain ?-Continue IV fluids until oral intake is consistent ? ?Normocytic anemia ?Mild and chronic. No evidence of macrocytosis. Stable. ? ?Alcohol abuse ?Patient has a history of alcohol withdrawal. Patient is interested in quitting. He reports going into withdrawal with delirium tremens secondary to attempts to quit. Started on CIWA, thiamine, folic acid and a MVI this admission ?-Continue CIWA, thiamine, folic acid and MVI ?-Start Librium with taper ?-TOC consulted for substance abuse resources ? ?Hyperlipidemia LDL goal <100 ?-Continue home atorvastatin ? ?Essential hypertension ?-Continue home clonidine, losartan and Toprol XL ? ? ? ?DVT prophylaxis: Lovenox ?Code Status:   Code Status: Full Code ?Family Communication: None at bedside ?Disposition Plan: Discharge home in 1-2 days pending ability to tolerate advanced diet ? ? ?Consultants:  ?None ? ?Procedures:  ?None ? ?Antimicrobials: ?None  ? ? ?Subjective: ?Abdominal pain is improved this morning. No nausea or vomiting. ? ?Objective: ?BP 138/72   Pulse 64   Temp 98.4 ?F (36.9 ?C) (Oral)   Resp  20   Ht '5\' 8"'$  (1.727 m)   Wt 66.7 kg   SpO2 95%   BMI 22.36 kg/m?  ? ?Examination: ? ?General exam: Appears calm and comfortable ?Respiratory system: Clear to auscultation. Respiratory effort normal. ?Cardiovascular system: S1 & S2 heard, RRR. No murmurs, rubs, gallops or clicks. ?Gastrointestinal system: Abdomen is nondistended, soft and nontender. No organomegaly or masses felt. Normal bowel sounds heard. ?Central nervous system: Alert and oriented. No focal neurological deficits. ?Musculoskeletal: No edema. No calf tenderness ?Skin: No cyanosis. No rashes ?Psychiatry: Judgement and insight appear normal. Mood & affect appropriate.  ? ? ?Data Reviewed: I have personally reviewed following labs and imaging studies ? ?CBC ?Lab Results  ?Component Value Date  ? WBC 6.9 04/11/2021  ? RBC 3.43 (L) 04/11/2021  ? HGB 11.7 (L) 04/11/2021  ? HCT 34.3 (L) 04/11/2021  ? MCV 100.0 04/11/2021  ? MCH 34.1 (H) 04/11/2021  ? PLT 129 (L) 04/11/2021  ? MCHC 34.1 04/11/2021  ? RDW 14.7 04/11/2021  ? LYMPHSABS 1.6 08/09/2020  ? MONOABS 0.5 08/09/2020  ? EOSABS 0.1 08/09/2020  ? BASOSABS 0.0 08/09/2020  ? ? ? ?Last metabolic panel ?Lab Results  ?Component Value Date  ? NA 136 04/11/2021  ? K 3.7 04/11/2021  ? CL 103 04/11/2021  ? CO2 26 04/11/2021  ? BUN 13 04/11/2021  ? CREATININE 1.25 (H) 04/11/2021  ? GLUCOSE 83 04/11/2021  ? GFRNONAA >60 04/11/2021  ? GFRAA 71 09/30/2019  ? CALCIUM 9.0 04/11/2021  ? PROT 7.6 04/11/2021  ? ALBUMIN 3.6 04/11/2021  ? BILITOT 0.9 04/11/2021  ? ALKPHOS 81 04/11/2021  ? AST 46 (H) 04/11/2021  ? ALT 29 04/11/2021  ? ANIONGAP 7 04/11/2021  ? ? ?  GFR: ?Estimated Creatinine Clearance: 54.1 mL/min (A) (by C-G formula based on SCr of 1.25 mg/dL (H)). ? ?No results found for this or any previous visit (from the past 240 hour(s)).  ? ? ?Radiology Studies: ?CT ABDOMEN PELVIS W CONTRAST ? ?Result Date: 04/10/2021 ?CLINICAL DATA:  Abdominal pain, acute, nonlocalized. History of pancreatitis. EXAM: CT ABDOMEN AND  PELVIS WITH CONTRAST TECHNIQUE: Multidetector CT imaging of the abdomen and pelvis was performed using the standard protocol following bolus administration of intravenous contrast. RADIATION DOSE REDUCTION: This exam was performed according to the departmental dose-optimization program which includes automated exposure control, adjustment of the mA and/or kV according to patient size and/or use of iterative reconstruction technique. CONTRAST:  183m OMNIPAQUE IOHEXOL 300 MG/ML  SOLN COMPARISON:  06/26/2020 FINDINGS: Lower chest: Chronic fibrotic and emphysematous change at the lung bases, right worse than left. No evidence of acute consolidation or pleural effusion. Hepatobiliary: Liver parenchyma is normal.  No calcified gallstones. Pancreas: Acute inflammatory change of the body and head of the pancreas with surrounding edema. No evidence of pseudocyst or abscess. Spleen: Normal Adrenals/Urinary Tract: Adrenal glands are normal. Mild atrophic change of the right kidney. Several small calcifications which could be vascular or small nonobstructing stones. The left kidney also shows scattered calcifications that could be vascular or small stones. No hydronephrosis on either side. The bladder is normal. Stomach/Bowel: Stomach and small intestine are normal. Normal appendix. No colon pathology. Vascular/Lymphatic: Aortic atherosclerosis, pronounced. No aneurysm. IVC is normal. No adenopathy. Reproductive: Normal Other: No free fluid or air. Musculoskeletal: Chronic lumbar degenerative changes. IMPRESSION: Acute pancreatitis of the head and proximal body. No pseudocyst or abscess. Chronic emphysematous and fibrotic changes at the lung bases. Aortic atherosclerosis, advanced. Small nonobstructing renal calculi and/or vascular calcifications. Electronically Signed   By: MNelson ChimesM.D.   On: 04/10/2021 14:43   ? ? ? LOS: 1 day  ? ? ?RCordelia Poche MD ?[facility] ?04/11/2021, 1:17 PM ? ? ?If 7PM-7AM, please contact  night-coverage [email or web address] ? ?

## 2021-04-11 NOTE — Assessment & Plan Note (Addendum)
Anemia stable.  Also has mild thrombocytopenia. ?Suspect due to alcohol bone marrow toxic effect. ?No bleeding reported. ?Follow-up CBC closely as outpatient after several days of alcohol abstinence and expect improvement.   ?Mild macrocytosis, MCV 100 > 104.  Outpatient evaluation as deemed necessary. ?

## 2021-04-11 NOTE — Hospital Course (Addendum)
Scott Lindsey is a 66 y.o. male with a history of alcohol use disorder, tobacco use, hypertension, hyperlipidemia, prostate cancer s/p radical prostatectomy. Patient presented secondary to abdominal pain and found to have evidence of acute uncomplicated pancreatitis.  Treated supportively with bowel rest, IV fluids and gradual diet advancement.  Clinically improved.  Alcohol abstinence has been extensively counseled. ?

## 2021-04-11 NOTE — Assessment & Plan Note (Signed)
-   Continue home atorvastatin 

## 2021-04-11 NOTE — Assessment & Plan Note (Addendum)
Patient has a history of alcohol withdrawal. Patient is interested in quitting. He reports going into withdrawal with delirium tremens secondary to attempts to quit. Started on CIWA, Librium taper, thiamine, folic acid and a MVI this admission ?-TOC consulted for substance abuse resources ?CIWA scores consistently 0. ?Due to concern for use of Librium along with alcohol and dangerous side effects related to same, no Librium or Ativan prescribed at time of discharge.  This was discussed in detail with patient.  I also discussed this with MD who rounded on patient yesterday. ?

## 2021-04-11 NOTE — Assessment & Plan Note (Addendum)
-  Continue home clonidine, losartan and Toprol XL ?-Reasonably controlled. ?

## 2021-04-11 NOTE — Assessment & Plan Note (Addendum)
Likely precipitated by alcohol use.  ?Patient with symptoms/abdominal pain, elevated lipase of 474 and CT evidence of uncomplicated pancreatitis.  ?Calcium of 9.  ?He was treated supportively with bowel rest, IV fluids, pain management.  Symptoms improved.  He was advanced to liquid diet yesterday which he tolerated, this was further advanced to soft diet for breakfast today which he has tolerated today without symptoms.  ?Added triglycerides to this morning labs for completion. ?Counseled extensively regarding alcohol abstinence.  He verbalized understanding. ?

## 2021-04-12 DIAGNOSIS — K852 Alcohol induced acute pancreatitis without necrosis or infection: Secondary | ICD-10-CM | POA: Diagnosis not present

## 2021-04-12 LAB — CBC
HCT: 33.8 % — ABNORMAL LOW (ref 39.0–52.0)
Hemoglobin: 11 g/dL — ABNORMAL LOW (ref 13.0–17.0)
MCH: 33.8 pg (ref 26.0–34.0)
MCHC: 32.5 g/dL (ref 30.0–36.0)
MCV: 104 fL — ABNORMAL HIGH (ref 80.0–100.0)
Platelets: 103 10*3/uL — ABNORMAL LOW (ref 150–400)
RBC: 3.25 MIL/uL — ABNORMAL LOW (ref 4.22–5.81)
RDW: 14.6 % (ref 11.5–15.5)
WBC: 6.2 10*3/uL (ref 4.0–10.5)
nRBC: 0 % (ref 0.0–0.2)

## 2021-04-12 LAB — BASIC METABOLIC PANEL
Anion gap: 8 (ref 5–15)
BUN: 10 mg/dL (ref 8–23)
CO2: 25 mmol/L (ref 22–32)
Calcium: 9 mg/dL (ref 8.9–10.3)
Chloride: 105 mmol/L (ref 98–111)
Creatinine, Ser: 1.04 mg/dL (ref 0.61–1.24)
GFR, Estimated: >60 mL/min (ref >60)
Glucose, Bld: 88 mg/dL (ref 70–99)
Potassium: 3.5 mmol/L (ref 3.5–5.1)
Sodium: 138 mmol/L (ref 135–145)

## 2021-04-12 LAB — LIPID PANEL
Cholesterol: 115 mg/dL (ref 0–200)
HDL: 49 mg/dL (ref >40)
LDL Cholesterol: 52 mg/dL (ref 0–99)
Total CHOL/HDL Ratio: 2.3 RATIO
Triglycerides: 68 mg/dL (ref <150)
VLDL: 14 mg/dL (ref 0–40)

## 2021-04-12 MED ORDER — NICOTINE 14 MG/24HR TD PT24: 14.0000 mg | MEDICATED_PATCH | Freq: Every day | TRANSDERMAL | 0 refills | Status: DC

## 2021-04-12 MED ORDER — THIAMINE HCL 100 MG PO TABS: 100.0000 mg | ORAL_TABLET | Freq: Every day | ORAL | 0 refills | Status: DC

## 2021-04-12 NOTE — Discharge Instructions (Addendum)
Additional Discharge Instructions ? ? ?Please get your medications reviewed and adjusted by your Primary MD. ? ?Please request your Primary MD to go over all Hospital Tests and Procedure/Radiological results at the follow up, please get all Hospital records sent to your Prim MD by signing hospital release before you go home. ? ?If you had Pneumonia of Lung problems at the Hospital: ?Please get a 2 view Chest X ray done in approximately 4 weeks after hospital discharge or sooner if instructed by your Primary MD. ? ?If you have Congestive Heart Failure: ?Please call your Cardiologist or Primary MD anytime you have any of the following symptoms:  ?1) 3 pound weight gain in 24 hours or 5 pounds in 1 week  ?2) shortness of breath, with or without a dry hacking cough  ?3) swelling in the hands, feet or stomach  ?4) if you have to sleep on extra pillows at night in order to breathe ? ?Follow cardiac low salt diet and 1.5 lit/day fluid restriction. ? ?If you have diabetes ?Accuchecks 4 times/day, Once in AM empty stomach and then before each meal. ?Log in all results and show them to your primary doctor at your next visit. ?If any glucose reading is under 80 or above 300 call your primary MD immediately. ? ?If you have Seizure/Convulsions/Epilepsy: ?Please do not drive, operate heavy machinery, participate in activities at heights or participate in high speed sports until you have seen by Primary MD or a Neurologist and advised to do so again. ?Per [facility] statutes, patients with seizures are not allowed to drive until they have been seizure-free for six months.  ?Use caution when using heavy equipment or power tools. Avoid working on ladders or at heights. Take showers instead of baths. Ensure the water temperature is not too high on the home water heater. Do not go swimming alone. Do not lock yourself in a room alone (i.e. bathroom). When caring for infants or small children, sit down when holding, feeding, or  changing them to minimize risk of injury to the child in the event you have a seizure. Maintain good sleep hygiene. Avoid alcohol.  ? ?If you had Gastrointestinal Bleeding: ?Please ask your Primary MD to check a complete blood count within one week of discharge or at your next visit. Your endoscopic/colonoscopic biopsies that are pending at the time of discharge, will also need to followed by your Primary MD. ? ?Get Medicines reviewed and adjusted. ?Please take all your medications with you for your next visit with your Primary MD ? ?Please request your Primary MD to go over all hospital tests and procedure/radiological results at the follow up, please ask your Primary MD to get all Hospital records sent to his/her office. ? ?If you experience worsening of your admission symptoms, develop shortness of breath, life threatening emergency, suicidal or homicidal thoughts you must seek medical attention immediately by calling 911 or calling your MD immediately  if symptoms less severe. ? ?You must read complete instructions/literature along with all the possible adverse reactions/side effects for all the Medicines you take and that have been prescribed to you. Take any new Medicines after you have completely understood and accpet all the possible adverse reactions/side effects.  ? ?Do not drive or operate heavy machinery when taking Pain medications.  ? ?Do not take more than prescribed Pain, Sleep and Anxiety Medications ? ?Special Instructions: If you have smoked or chewed Tobacco  in the last 2 yrs please stop smoking, stop any  regular Alcohol  and or any Recreational drug use. ? ?Wear Seat belts while driving. ? ?Please note ?You were cared for by a hospitalist during your hospital stay. If you have any questions about your discharge medications or the care you received while you were in the hospital after you are discharged, you can call the unit and asked to speak with the hospitalist on call if the hospitalist  that took care of you is not available. Once you are discharged, your primary care physician will handle any further medical issues. Please note that NO REFILLS for any discharge medications will be authorized once you are discharged, as it is imperative that you return to your primary care physician (or establish a relationship with a primary care physician if you do not have one) for your aftercare needs so that they can reassess your need for medications and monitor your lab values. ? ?You can reach the hospitalist office at phone [phone] or fax [phone] ?  ?If you do not have a primary care physician, you can call [phone] for a physician referral. ? ? ? ? ? ? ?Outpatient Substance Use Treatment Services  ? ?[facility] ?[address] ?[place], [place] [address]  ?[phone] ?Private insurance, Medicare A&B, and GCCN  ? ?[facility]  ?[address],  ?[place], [place] [address] ?[phone] ?Medicaid, Self Pay  ? ?[facility]                                      ?[address]  ?[place], [place] ?[phone] ?Medicaid and [employer], Self Pay  ? ?[facility] ?[address]  ?[place], [place]  ?[phone] ?Private Insurance, and Self Pay ? ?[facility]                                                ?[place]                   ?[place], [place] [address]  ?[phone] or [phone] ?Private Insurance Only  ? ?[facility] ?[address]  ?[place], [place] ?[phone] ?Medicaid, Medicare, Private Insurance ? ?[facility], television/film/video at the [employer] ?[address]  ?[place], [place] [address] ?[phone] ?Services are free or reduced ? ?[facility]  ?[address] ?[phone]  ?Self Pay only, sliding scale ? ?[facility]  ?[address]   ?[place], [place] [address] ?[phone] ?Educational psychologist) ?Self Pay, Medicaid Only  ? ?[facility] ?[place],  [address] ?[phone] ?Medicaid, Medicare, Private Insurance ? ?Residential Substance Use Treatment Services  ? ?[facility]  ?[place]  ?[place],  [address]  ?[phone] or [phone] ?Detox (Medicare, Medicaid, private insurance, and self pay)  ?Residential Rehab 14 days (Medicare, Medicaid, private insurance, and self pay)  ? ?[facility]  ?[place], [place]  ?[phone]  ?Male and Male Detox (Self Pay and Medicaid limited availability)  ?Rehab only Male (Medicaid and self pay only)  ? ?[facility]                                                ?  [place]  ?[place], [place] [address]  ?[phone] or [phone] ?Detox and Residential Treatment ?Private Insurance Only  ? ?[facility]  ?[place]  ?[place], [place] [address]  ?[phone]  ?Treatment Only, must make assessment appointment, and must be sober for assessment appointment.  ?Self Pay Only, Medicare A&B, [facility], [place] ID only! ?*Transportation assistance offered from [place] on [employer] ? ?[facility]                                                ?[address] ?[place], [place] [address] ?Walk in interviews M-Sat 8-4p ?No pending legal charges ?[phone] ? ? ? ? ?[facility]:  [facility] ?Referral  ?[place] ?[place], [place] ?[phone] ?(Self Pay, Medicaid) ? ?[facility] ?[place], [place] [address] ?[phone] ?Detox and Residential Treatment ?Medicare and Private Insurance ? ?[facility] ?[place]  [place], [place] [address] ?[place]: [phone] ?31 Day Men's Facility: [phone] ?Long-term Residential Program:  ?[phone] Males 25 and Over ?(No Insurance, upfront fee) ? ?[facility]  ?[place] ?[place], [place] [address] ?[phone]) [phone] ?Private Insurance with [employer], Private Pay ?[facility] ?[place] ?[place], [place] [address] ?Local [phone] ?Private Insurance Only ? ?[place] ?[place]

## 2021-04-12 NOTE — Plan of Care (Signed)
?  Problem: Education: Goal: Knowledge of General Education information will improve Description: Including pain rating scale, medication(s)/side effects and non-pharmacologic comfort measures Outcome: Progressing   Problem: Clinical Measurements: Goal: Diagnostic test results will improve Outcome: Progressing   Problem: Activity: Goal: Risk for activity intolerance will decrease Outcome: Progressing   Problem: Nutrition: Goal: Adequate nutrition will be maintained Outcome: Progressing   

## 2021-04-12 NOTE — TOC Initial Note (Signed)
Transition of Care (TOC) - Initial/Assessment Note  ? ? ?Patient Details  ?Name: Scott Lindsey ?MRN: [id number] ?Date of Birth: 02/11/1955 ? ?Transition of Care (TOC) CM/SW Contact:    ?Tawanna Cooler, RN ?Phone Number: ?04/12/2021, 11:52 AM ? ?Clinical Narrative:                 ? ?Spoke with patient at bedside.  Agreed to have substance abuse resources added to the AVS.  No further TOC needs.  ? ? ?Expected Discharge Plan: Home/Self Care ?Barriers to Discharge: Continued Medical Work up ? ? ?Expected Discharge Plan and Services ?Expected Discharge Plan: Home/Self Care ?  ?  ?Living arrangements for the past 2 months: Scott Lindsey ?Expected Discharge Date: 04/12/21               ?  ? Prior Living Arrangements/Services ?Living arrangements for the past 2 months: Scott Lindsey ?Lives with:: Spouse ?Patient language and need for interpreter reviewed:: Yes ?       ?Need for Family Participation in Patient Care: Yes (Comment) ?Care giver support system in place?: Yes (comment) ?  ?Criminal Activity/Legal Involvement Pertinent to Current Situation/Hospitalization: No - Comment as needed ? ?Activities of Daily Living ?Home Assistive Devices/Equipment: Blood pressure cuff ?ADL Screening (condition at time of admission) ?Patient's cognitive ability adequate to safely complete daily activities?: Yes ?Is the patient deaf or have difficulty hearing?: No ?Does the patient have difficulty seeing, even when wearing glasses/contacts?: Yes ?Does the patient have difficulty concentrating, remembering, or making decisions?: No ?Patient able to express need for assistance with ADLs?: Yes ?Does the patient have difficulty dressing or bathing?: No ?Independently performs ADLs?: Yes (appropriate for developmental age) ?Does the patient have difficulty walking or climbing stairs?: No ?Weakness of Legs: None ?Weakness of Arms/Hands: None ? ?Emotional Assessment ?  ?Orientation: : Oriented to Self, Oriented to Place, Oriented to   Time, Oriented to Situation ?Alcohol / Substance Use: Alcohol Use ?Psych Involvement: No (comment) ? ?Admission diagnosis:  Acute pancreatitis [K85.90] ?Alcohol-induced acute pancreatitis, unspecified complication status [V72.82] ?Patient Active Problem List  ? Diagnosis Date Noted  ? Acute pancreatitis 04/10/2021  ? Normocytic anemia 04/10/2021  ? Simple chronic bronchitis ([place]) 08/11/2020  ? Need for shingles vaccine 08/11/2020  ? Atherosclerosis of aorta ([place]) 08/09/2020  ? Prostate cancer screening 08/09/2020  ? Alcohol-induced acute pancreatitis without infection or necrosis 07/05/2020  ? Symptomatic PVCs 03/17/2019  ? Hypomagnesemia 03/17/2019  ? Drug-induced hypokalemia 08/20/2018  ? Dietary folate deficiency anemia 09/26/2017  ? 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 Thiamine deficiency 05/01/2016  ? Tobacco abuse disorder 04/26/2016  ? Hyperlipidemia LDL goal <100 04/26/2016  ? Erectile dysfunction due to arterial insufficiency 04/26/2016  ? Alcohol use disorder 04/26/2016  ? Essential hypertension 02/27/2016  ? Prostate cancer ([place]) 12/12/2010  ? ?PCP:  [name], MD ?Pharmacy:   ?[place] ?[place] [address] ?Phone: [phone] Fax: [phone] ? ?

## 2021-04-12 NOTE — Discharge Summary (Signed)
Physician Discharge Summary  ?Scott Lindsey:[id number] DOB: September 16, 1955 ? ?PCP: [name], MD ? ?Admitted from: Home ?Discharged to: Home ? ?Admit date: 04/10/2021 ?Discharge date: 04/12/2021 ? ?Recommendations for Outpatient Follow-up:  ? ? Follow-up Information   ? ? [name], MD. Schedule an appointment as soon as possible for a visit in 1 week(s).   ?Specialty: Internal Medicine ?Why: To be seen with repeat labs (CBC, CMP, lipase & magnesium). ?Contact information: ?[place] [address] ?[phone] ? ? ?  ?  ? ? [name], MD .   ?Specialties: Cardiology, Radiology ?Contact information: ?[address] ?[address] ?[place] [address] ?[phone] ? ? ?  ?  ? ?  ?  ? ?  ? ? ?Home Health: None ?  ? ?Equipment/Devices: None ?  ? ?Discharge Condition: Improved and stable. ?  Code Status: Full Code ?Diet recommendation:  ?Discharge Diet Orders (From admission, onward)  ? ?  Start     Ordered  ? 04/12/21 0000  Diet - low sodium heart healthy       ?Comments: Soft and low fat diet for several days then gradually advance to regular consistency diet as tolerated.  ? 04/12/21 1059  ? ?  ?  ? ?  ?  ? ?Discharge Diagnoses:  ?Principal Problem: ?  Acute pancreatitis ?Active Problems: ?  Normocytic anemia ?  Alcohol use disorder ?  Essential hypertension ?  Hyperlipidemia LDL goal <100 ?  Tobacco abuse disorder ? ? ?Brief Hospital Course: ?Scott Lindsey is a 66 y.o. male with a history of alcohol use disorder, tobacco use, hypertension, hyperlipidemia, prostate cancer s/p radical prostatectomy. Patient presented secondary to abdominal pain and found to have evidence of acute uncomplicated pancreatitis.  Treated supportively with bowel rest, IV fluids and gradual diet advancement.  Clinically improved.  Alcohol abstinence has been extensively counseled. ? ?Assessment and Plan: ?* Acute pancreatitis ?Likely precipitated by alcohol use.  ?Patient with symptoms/abdominal pain,  elevated lipase of 474 and CT evidence of uncomplicated pancreatitis.  ?Calcium of 9.  ?He was treated supportively with bowel rest, IV fluids, pain management.  Symptoms improved.  He was advanced to liquid diet yesterday which he tolerated, this was further advanced to soft diet for breakfast today which he has tolerated today without symptoms.  ?Added triglycerides to this morning labs for completion. ?Counseled extensively regarding alcohol abstinence.  He verbalized understanding. ? ?Normocytic anemia ?Anemia stable.  Also has mild thrombocytopenia. ?Suspect due to alcohol bone marrow toxic effect. ?No bleeding reported. ?Follow-up CBC closely as outpatient after several days of alcohol abstinence and expect improvement.   ?Mild macrocytosis, MCV 100 > 104.  Outpatient evaluation as deemed necessary. ? ?Alcohol use disorder ?Patient has a history of alcohol withdrawal. Patient is interested in quitting. He reports going into withdrawal with delirium tremens secondary to attempts to quit. Started on CIWA, Librium taper, thiamine, folic acid and a MVI this admission ?-TOC consulted for substance abuse resources ?CIWA scores consistently 0. ?Due to concern for use of Librium along with alcohol and dangerous side effects related to same, no Librium or Ativan prescribed at time of discharge.  This was discussed in detail with patient.  I also discussed this with MD who rounded on patient yesterday. ? ?Essential hypertension ?-Continue home clonidine, losartan and Toprol XL ?-Reasonably controlled. ? ?Hyperlipidemia LDL goal <100 ?-Continue home atorvastatin ? ?Tobacco abuse disorder ?Tobacco cessation counseled. ?Continue nicotine patch, patient requests. ? ? ?  Body mass index is 22.36 kg/m?. ? ? ?Consultations: ?None ? ?Procedures: ?None ? ?Discharge Instructions ?Discharge Instructions   ? ? Call MD for:  difficulty breathing, headache or visual disturbances   Complete by: As directed ?  ? Call MD for:  extreme  fatigue   Complete by: As directed ?  ? Call MD for:  persistant dizziness or light-headedness   Complete by: As directed ?  ? Call MD for:  persistant nausea and vomiting   Complete by: As directed ?  ? Call MD for:  severe uncontrolled pain   Complete by: As directed ?  ? Call MD for:  temperature >100.4   Complete by: As directed ?  ? Diet - low sodium heart healthy   Complete by: As directed ?  ? Soft and low fat diet for several days then gradually advance to regular consistency diet as tolerated.  ? Increase activity slowly   Complete by: As directed ?  ? ?  ? ?  ?Medication List  ?  ? ?TAKE these medications   ? ?atorvastatin 20 MG tablet ?Commonly known as: LIPITOR ?Take 1 tablet by mouth once daily ?  ?cloNIDine 0.1 MG tablet ?Commonly known as: CATAPRES ?TAKE 1 TABLET BY MOUTH THREE TIMES DAILY ?  ?folic acid 1 MG tablet ?Commonly known as: FOLVITE ?Take 1 tablet by mouth once daily ?  ?losartan 100 MG tablet ?Commonly known as: COZAAR ?TAKE 1 TABLET BY MOUTH ONCE DAILY . APPOINTMENT REQUIRED FOR FUTURE REFILLS ?  ?magnesium oxide 400 (241.3 Mg) MG tablet ?Commonly known as: MAG-OX ?Take 1 tablet (400 mg total) by mouth 2 (two) times daily. ?  ?metoprolol succinate 25 MG 24 hr tablet ?Commonly known as: TOPROL-XL ?Take 1 tablet (25 mg total) by mouth daily. ?  ?multivitamin with minerals Tabs tablet ?Take 1 tablet by mouth daily. ?  ?nicotine 14 mg/24hr patch ?Commonly known as: NICODERM CQ - dosed in mg/24 hours ?Place 1 patch (14 mg total) onto the skin daily. ?Start taking on: April 13, 2021 ?  ?thiamine 100 MG tablet ?Take 1 tablet (100 mg total) by mouth daily. ?What changed:  ?medication strength ?how much to take ?  ? ?  ? ?No Known Allergies ?Procedures/Studies: ?CT ABDOMEN PELVIS W CONTRAST ? ?Result Date: 04/10/2021 ?CLINICAL DATA:  Abdominal pain, acute, nonlocalized. History of pancreatitis. EXAM: CT ABDOMEN AND PELVIS WITH CONTRAST TECHNIQUE: Multidetector CT imaging of the abdomen and pelvis was  performed using the standard protocol following bolus administration of intravenous contrast. RADIATION DOSE REDUCTION: This exam was performed according to the departmental dose-optimization program which includes automated exposure control, adjustment of the mA and/or kV according to patient size and/or use of iterative reconstruction technique. CONTRAST:  166m OMNIPAQUE IOHEXOL 300 MG/ML  SOLN COMPARISON:  06/26/2020 FINDINGS: Lower chest: Chronic fibrotic and emphysematous change at the lung bases, right worse than left. No evidence of acute consolidation or pleural effusion. Hepatobiliary: Liver parenchyma is normal.  No calcified gallstones. Pancreas: Acute inflammatory change of the body and head of the pancreas with surrounding edema. No evidence of pseudocyst or abscess. Spleen: Normal Adrenals/Urinary Tract: Adrenal glands are normal. Mild atrophic change of the right kidney. Several small calcifications which could be vascular or small nonobstructing stones. The left kidney also shows scattered calcifications that could be vascular or small stones. No hydronephrosis on either side. The bladder is normal. Stomach/Bowel: Stomach and small intestine are normal. Normal appendix. No colon pathology. Vascular/Lymphatic: Aortic atherosclerosis, pronounced. No aneurysm.  IVC is normal. No adenopathy. Reproductive: Normal Other: No free fluid or air. Musculoskeletal: Chronic lumbar degenerative changes. IMPRESSION: Acute pancreatitis of the head and proximal body. No pseudocyst or abscess. Chronic emphysematous and fibrotic changes at the lung bases. Aortic atherosclerosis, advanced. Small nonobstructing renal calculi and/or vascular calcifications. Electronically Signed   By: Nelson Chimes M.D.   On: 04/10/2021 14:43   ? ?Subjective: ?Seen this morning.  Had tolerated liquid diet without nausea, vomiting or abdominal pain.  Had BM just prior to hospital admission.  Passing flatus.  Since then per RN report,  patient has tolerated soft breakfast without symptoms.  Patient reports that he drinks maybe anywhere from 2-4 shots of vodka per day. ? ?Discharge Exam: ?Vitals:  ? 04/12/21 0134 04/12/21 0422 04/12/21 3244 01

## 2021-04-12 NOTE — Plan of Care (Signed)
?  Problem: Education: ?Goal: Knowledge of General Education information will improve ?Description: Including pain rating scale, medication(s)/side effects and non-pharmacologic comfort measures ?Outcome: Completed/Met ?  ?Problem: Health Behavior/Discharge Planning: ?Goal: Ability to manage health-related needs will improve ?Outcome: Completed/Met ?  ?Problem: Clinical Measurements: ?Goal: Ability to maintain clinical measurements within normal limits will improve ?Outcome: Completed/Met ?Goal: Diagnostic test results will improve ?Outcome: Completed/Met ?Goal: Cardiovascular complication will be avoided ?Outcome: Completed/Met ?  ?Problem: Activity: ?Goal: Risk for activity intolerance will decrease ?Outcome: Completed/Met ?  ?Problem: Nutrition: ?Goal: Adequate nutrition will be maintained ?Outcome: Completed/Met ?  ?Problem: Coping: ?Goal: Level of anxiety will decrease ?Outcome: Completed/Met ?  ?Problem: Elimination: ?Goal: Will not experience complications related to bowel motility ?Outcome: Completed/Met ?Goal: Will not experience complications related to urinary retention ?Outcome: Completed/Met ?  ?Problem: Pain Managment: ?Goal: General experience of comfort will improve ?Outcome: Completed/Met ?  ?Problem: Safety: ?Goal: Ability to remain free from injury will improve ?Outcome: Completed/Met ?  ?Problem: Health Behavior/Discharge Planning: ?Goal: Ability to formulate a plan to maintain an alcohol-free life will improve ?04/12/2021 1349 by Kerrin Mo, RN ?Outcome: Completed/Met ?04/12/2021 1114 by Kerrin Mo, RN ?Outcome: Adequate for Discharge ?  ?Problem: Nutritional: ?Goal: Ability to achieve adequate nutritional intake will improve ?Outcome: Completed/Met ?  ?Problem: Clinical Measurements: ?Goal: Complications related to the disease process, condition or treatment will be avoided or minimized ?Outcome: Completed/Met ?  ?

## 2021-04-12 NOTE — Progress Notes (Signed)
D/C instructions reviewed w/ pt. Pt verbalizes understanding and all questions answered. Pt d/c in w/c in stable condition to wife's car. Pt in possession of d/c packet and all personal belongings.  ?

## 2021-04-12 NOTE — Assessment & Plan Note (Signed)
Tobacco cessation counseled. ?Continue nicotine patch, patient requests. ?

## 2021-04-13 ENCOUNTER — Encounter: Payer: Self-pay | Admitting: Internal Medicine

## 2021-04-13 ENCOUNTER — Ambulatory Visit (INDEPENDENT_AMBULATORY_CARE_PROVIDER_SITE_OTHER): Payer: Medicare Other | Admitting: Internal Medicine

## 2021-04-13 VITALS — BP 136/82 | HR 80 | Temp 98.8°F | Resp 16 | Ht 68.0 in | Wt 150.0 lb

## 2021-04-13 DIAGNOSIS — F10930 Alcohol use, unspecified with withdrawal, uncomplicated: Secondary | ICD-10-CM | POA: Diagnosis not present

## 2021-04-13 DIAGNOSIS — D520 Dietary folate deficiency anemia: Secondary | ICD-10-CM | POA: Diagnosis not present

## 2021-04-13 DIAGNOSIS — K852 Alcohol induced acute pancreatitis without necrosis or infection: Secondary | ICD-10-CM

## 2021-04-13 MED ORDER — CHLORDIAZEPOXIDE HCL 5 MG PO CAPS: 5.0000 mg | ORAL_CAPSULE | Freq: Three times a day (TID) | ORAL | 0 refills | Status: DC | PRN

## 2021-04-13 NOTE — Patient Instructions (Signed)
Acute Pancreatitis ?The pancreas is a gland that is located behind the stomach on the left side of the abdomen. It produces enzymes that help to digest food. The pancreas also releases the hormones glucagon and insulin, which help to regulate blood sugar. Acute pancreatitis happens when inflammation of the pancreas suddenly occurs and the pancreas becomes irritated and swollen. ?Most acute attacks last a few days and cause serious problems. Some people become dehydrated and develop low blood pressure. In severe cases, bleeding in the abdomen can lead to shock and can be life-threatening. The lungs, heart, and kidneys may fail. ?What are the causes? ?This condition may be caused by: ?Alcohol abuse. ?Drug abuse. ?Gallstones or other conditions that can block the tube that drains the pancreas (pancreatic duct). ?A tumor in the pancreas. ?Other causes include: ?Certain medicines. ?Exposure to certain chemicals. ?Diabetes. ?An infection in the pancreas. ?Damage caused by an accident (trauma). ?The poison (venom) from a scorpion bite. ?Abdominal surgery. ?Autoimmune pancreatitis. This is when the body's disease-fighting (immune) system attacks the pancreas. ?Genes that are passed from parent to child (inherited). ?In some cases, the cause of this condition is not known. ?What are the signs or symptoms? ?Symptoms of this condition include: ?Pain in the upper abdomen that may radiate to the back. Pain may be severe. ?Tenderness and swelling of the abdomen. ?Nausea and vomiting. ?Fever. ?How is this diagnosed? ?This condition may be diagnosed based on: ?A physical exam. ?Blood tests. ?Imaging tests, such as X-rays, CT or MRI scans, or an ultrasound of the abdomen. ?How is this treated? ?Treatment for this condition usually requires a stay in the hospital. Treatment for this condition may include: ?Pain medicine. ?Fluid replacement through an IV. ?Placing a tube in the stomach to remove stomach contents and to control  vomiting (NG tube, or nasogastric tube). ?Not eating for 3-4 days. This gives the pancreas a rest, because enzymes are not being produced that can cause further damage. ?Antibiotic medicines, if your condition is caused by an infection. ?Treating any underlying conditions that may be the cause. ?Steroid medicines, if your condition is caused by your immune system attacking your body's own tissues (autoimmune disease). ?Surgery on the pancreas or gallbladder. ?Follow these instructions at home: ?Eating and drinking ? ?Follow instructions from your health care provider about diet. This may involve avoiding alcohol and decreasing the amount of fat in your diet. ?Eat smaller, more frequent meals. This reduces the amount of digestive fluids that the pancreas produces. ?Drink enough fluid to keep your urine pale yellow. ?Do not drink alcohol if it caused your condition. ?General instructions ?Take over-the-counter and prescription medicines only as told by your health care provider. ?Do not drive or use heavy machinery while taking prescription pain medicine. ?Ask your health care provider if the medicine prescribed to you can cause constipation. You may need to take steps to prevent or treat constipation, such as: ?Take an over-the-counter or prescription medicine for constipation. ?Eat foods that are high in fiber such as whole grains and beans. ?Limit foods that are high in fat and processed sugars, such as fried or sweet foods. ?Do not use any products that contain nicotine or tobacco, such as cigarettes, e-cigarettes, and chewing tobacco. If you need help quitting, ask your health care provider. ?Get plenty of rest. ?If directed, check your blood sugar at home as told by your health care provider. ?Keep all follow-up visits as told by your health care provider. This is important. ?Contact a health care  provider if you: ?Do not recover as quickly as expected. ?Develop new or worsening symptoms. ?Have persistent pain,  weakness, or nausea. ?Recover and then have another episode of pain. ?Have a fever. ?Get help right away if: ?You cannot eat or keep fluids down. ?Your pain becomes severe. ?Your skin or the white part of your eyes turns yellow (jaundice). ?You have sudden swelling in your abdomen. ?You vomit. ?You feel dizzy or you faint. ?Your blood sugar is high (over 300 mg/dL). ?Summary ?Acute pancreatitis happens when inflammation of the pancreas suddenly occurs and the pancreas becomes irritated and swollen. ?This condition is typically caused by alcohol abuse, drug abuse, or gallstones. ?Treatment for this condition usually requires a stay in the hospital. ?This information is not intended to replace advice given to you by your health care provider. Make sure you discuss any questions you have with your health care provider. ?Document Revised: 10/14/2017 Document Reviewed: 07/01/2017 ?Elsevier Patient Education ? [place]. ? ?

## 2021-04-13 NOTE — Progress Notes (Signed)
? ?Subjective:  ?Patient ID: Scott Lindsey, male    DOB: Jul 06, 1955  Age: 66 y.o. MRN: [id number] ? ?CC: Anemia ? ? ?HPI ?[name] presents for f/up -  ? ?He was recently admitted for alcoholic pancreatitis.  He has gained 3 pounds since discharge.  He is keeping down clear liquids without nausea, vomiting, abdominal pain, diarrhea, fever, or chills.  He is taking a thiamine supplement.  He was anemic and thrombocytopenic.  Librium was prescribed for the detox symptoms and he would like to continue taking this for another week or 2. ? ?Admitted from: Home ?Discharged to: Home ?  ?Admit date: 04/10/2021 ?Discharge date: 04/12/2021 ?  ?Recommendations for Outpatient Follow-up:  ?  ?  Follow-up Information   ?  ?  [name], MD. Schedule an appointment as soon as possible for a visit in 1 week(s).   ?Specialty: Internal Medicine ?Why: To be seen with repeat labs (CBC, CMP, lipase & magnesium). ?Contact information: ?[place] [address] ?[phone] ?  ?  ?   ?  ?  ?  [name], MD .   ?Specialties: Cardiology, Radiology ?Contact information: ?[address] ?[address] ?[place] [address] ?[phone] ?  ?  ?   ?  ?  ?   ?  ?  ?   ?  ?  ?Home Health: None ?  ?  ?Equipment/Devices: None ?  ?  ?Discharge Condition: Improved and stable. ?  Code Status: Full Code ?Diet recommendation:  ?Discharge Diet Orders (From admission, onward)  ?  ?    Start     Ordered  ?  04/12/21 0000   Diet - low sodium heart healthy       ?Comments: Soft and low fat diet for several days then gradually advance to regular consistency diet as tolerated.  ? 04/12/21 1059  ?  ?   ?  ?  ?   ?  ?  ?Discharge Diagnoses:  ?Principal Problem: ?  Acute pancreatitis ?Active Problems: ?  Normocytic anemia ?  Alcohol use disorder ?  Essential hypertension ?  Hyperlipidemia LDL goal <100 ?  Tobacco abuse disorder ?  ?  ?Brief Hospital Course: ?[name] is a 66 y.o. male with a history of alcohol use  disorder, tobacco use, hypertension, hyperlipidemia, prostate cancer s/p radical prostatectomy. Patient presented secondary to abdominal pain and found to have evidence of acute uncomplicated pancreatitis.  Treated supportively with bowel rest, IV fluids and gradual diet advancement.  Clinically improved.  Alcohol abstinence has been extensively counseled. ?  ?Assessment and Plan: ?* Acute pancreatitis ?Likely precipitated by alcohol use.  ?Patient with symptoms/abdominal pain, elevated lipase of 474 and CT evidence of uncomplicated pancreatitis.  ?Calcium of 9.  ?He was treated supportively with bowel rest, IV fluids, pain management.  Symptoms improved.  He was advanced to liquid diet yesterday which he tolerated, this was further advanced to soft diet for breakfast today which he has tolerated today without symptoms.  ?Added triglycerides to this morning labs for completion. ?Counseled extensively regarding alcohol abstinence.  He verbalized understanding. ?  ?Normocytic anemia ?Anemia stable.  Also has mild thrombocytopenia. ?Suspect due to alcohol bone marrow toxic effect. ?No bleeding reported. ?Follow-up CBC closely as outpatient after several days of alcohol abstinence and expect improvement.   ?Mild macrocytosis, MCV 100 > 104.  Outpatient evaluation as deemed necessary. ?  ?Alcohol use disorder ?Patient has a history of alcohol withdrawal.  Patient is interested in quitting. He reports going into withdrawal with delirium tremens secondary to attempts to quit. Started on CIWA, Librium taper, thiamine, folic acid and a MVI this admission ?-TOC consulted for substance abuse resources ?CIWA scores consistently 0. ?Due to concern for use of Librium along with alcohol and dangerous side effects related to same, no Librium or Ativan prescribed at time of discharge.  This was discussed in detail with patient.  I also discussed this with MD who rounded on patient yesterday. ?  ?Essential hypertension ?-Continue home  clonidine, losartan and Toprol XL ?-Reasonably controlled. ?  ?Hyperlipidemia LDL goal <100 ?-Continue home atorvastatin ?  ?Tobacco abuse disorder ?Tobacco cessation counseled. ?Continue nicotine patch, patient requests. ?  ?  ?Body mass index is 22.36 kg/m?. ?  ? ?Outpatient Medications Prior to Visit  ?Medication Sig Dispense Refill  ? atorvastatin (LIPITOR) 20 MG tablet Take 1 tablet by mouth once daily 90 tablet 1  ? cloNIDine (CATAPRES) 0.1 MG tablet TAKE 1 TABLET BY MOUTH THREE TIMES DAILY 671 tablet 0  ? folic acid (FOLVITE) 1 MG tablet Take 1 tablet by mouth once daily 90 tablet 1  ? losartan (COZAAR) 100 MG tablet TAKE 1 TABLET BY MOUTH ONCE DAILY . APPOINTMENT REQUIRED FOR FUTURE REFILLS 30 tablet 0  ? magnesium oxide (MAG-OX) 400 (241.3 Mg) MG tablet Take 1 tablet (400 mg total) by mouth 2 (two) times daily. 180 tablet 3  ? metoprolol succinate (TOPROL-XL) 25 MG 24 hr tablet Take 1 tablet (25 mg total) by mouth daily. 90 tablet 3  ? Multiple Vitamin (MULTIVITAMIN WITH MINERALS) TABS tablet Take 1 tablet by mouth daily.    ? nicotine (NICODERM CQ - DOSED IN MG/24 HOURS) 14 mg/24hr patch Place 1 patch (14 mg total) onto the skin daily. 28 patch 0  ? thiamine 100 MG tablet Take 1 tablet (100 mg total) by mouth daily. 30 tablet 0  ? ?No facility-administered medications prior to visit.  ? ? ?ROS ?Review of Systems  ?Constitutional:  Negative for chills, diaphoresis, fatigue and fever.  ?HENT: Negative.  Negative for trouble swallowing.   ?Eyes: Negative.   ?Respiratory:  Negative for cough, chest tightness, shortness of breath and wheezing.   ?Gastrointestinal:  Negative for abdominal pain, constipation, diarrhea, nausea and vomiting.  ?Endocrine: Negative.   ?Genitourinary: Negative.  Negative for difficulty urinating.  ?Musculoskeletal: Negative.   ?Skin: Negative.   ?Neurological: Negative.  Negative for dizziness, weakness and light-headedness.  ?Hematological:  Negative for adenopathy. Does not  bruise/bleed easily.  ?Psychiatric/Behavioral:  Negative for confusion, decreased concentration, dysphoric mood, hallucinations, sleep disturbance and suicidal ideas. The patient is nervous/anxious.   ? ?Objective:  ?BP 136/82 (BP Location: Right Arm, Patient Position: Sitting, Cuff Size: Normal)   Pulse 80   Temp 98.8 ?F (37.1 ?C) (Oral)   Resp 16   Ht '5\' 8"'$  (1.727 m)   Wt 150 lb (68 kg)   SpO2 96%   BMI 22.81 kg/m?  ? ?BP Readings from Last 3 Encounters:  ?04/13/21 136/82  ?04/12/21 132/72  ?11/29/20 (!) 142/98  ? ? ?Wt Readings from Last 3 Encounters:  ?04/13/21 150 lb (68 kg)  ?04/10/21 147 lb 0.8 oz (66.7 kg)  ?11/29/20 145 lb 2 oz (65.8 kg)  ? ? ?Physical Exam ?Vitals reviewed.  ?Constitutional:   ?   Appearance: Normal appearance. He is not ill-appearing.  ?HENT:  ?   Nose: Nose normal.  ?   Mouth/Throat:  ?   Mouth:  Mucous membranes are moist.  ?Eyes:  ?   General: No scleral icterus. ?   Conjunctiva/sclera: Conjunctivae normal.  ?Cardiovascular:  ?   Rate and Rhythm: Normal rate and regular rhythm. Occasional Extrasystoles are present. ?   Heart sounds: No murmur heard. ?Pulmonary:  ?   Effort: Pulmonary effort is normal.  ?   Breath sounds: No stridor. Examination of the right-lower field reveals rales. Examination of the left-lower field reveals rales. Rales present. No decreased breath sounds, wheezing or rhonchi.  ?Abdominal:  ?   General: Abdomen is flat. Bowel sounds are normal.  ?   Palpations: There is no hepatomegaly, splenomegaly or mass.  ?   Tenderness: There is no abdominal tenderness. There is no guarding.  ?   Hernia: No hernia is present.  ?Musculoskeletal:     ?   General: Normal range of motion.  ?   Cervical back: Neck supple.  ?   Right lower leg: No edema.  ?   Left lower leg: No edema.  ?Lymphadenopathy:  ?   Cervical: No cervical adenopathy.  ?Skin: ?   General: Skin is warm and dry.  ?Neurological:  ?   General: No focal deficit present.  ?   Mental Status: He is alert.   ?Psychiatric:     ?   Attention and Perception: Perception normal. He is inattentive.     ?   Mood and Affect: Mood and affect normal.     ?   Speech: Speech is delayed and tangential.     ?   Behavior: Behavior is slowed. Beha

## 2021-04-26 ENCOUNTER — Other Ambulatory Visit: Payer: Self-pay | Admitting: Cardiology

## 2021-04-27 ENCOUNTER — Ambulatory Visit (INDEPENDENT_AMBULATORY_CARE_PROVIDER_SITE_OTHER): Payer: Medicare Other | Admitting: Internal Medicine

## 2021-04-27 VITALS — BP 130/64 | Temp 98.2°F | Ht 68.0 in | Wt 149.0 lb

## 2021-04-27 DIAGNOSIS — E519 Thiamine deficiency, unspecified: Secondary | ICD-10-CM | POA: Diagnosis not present

## 2021-04-27 DIAGNOSIS — R778 Other specified abnormalities of plasma proteins: Secondary | ICD-10-CM

## 2021-04-27 DIAGNOSIS — D520 Dietary folate deficiency anemia: Secondary | ICD-10-CM

## 2021-04-27 DIAGNOSIS — K852 Alcohol induced acute pancreatitis without necrosis or infection: Secondary | ICD-10-CM | POA: Diagnosis not present

## 2021-04-27 LAB — CBC WITH DIFFERENTIAL/PLATELET
Basophils Absolute: 0 10*3/uL (ref 0.0–0.1)
Basophils Relative: 0.4 % (ref 0.0–3.0)
Eosinophils Absolute: 0.1 10*3/uL (ref 0.0–0.7)
Eosinophils Relative: 1.1 % (ref 0.0–5.0)
HCT: 35.9 % — ABNORMAL LOW (ref 39.0–52.0)
Hemoglobin: 12 g/dL — ABNORMAL LOW (ref 13.0–17.0)
Lymphocytes Relative: 34.7 % (ref 12.0–46.0)
Lymphs Abs: 1.9 10*3/uL (ref 0.7–4.0)
MCHC: 33.3 g/dL (ref 30.0–36.0)
MCV: 99.4 fl (ref 78.0–100.0)
Monocytes Absolute: 0.6 10*3/uL (ref 0.1–1.0)
Monocytes Relative: 10.6 % (ref 3.0–12.0)
Neutro Abs: 3 10*3/uL (ref 1.4–7.7)
Neutrophils Relative %: 53.2 % (ref 43.0–77.0)
Platelets: 201 10*3/uL (ref 150.0–400.0)
RBC: 3.61 Mil/uL — ABNORMAL LOW (ref 4.22–5.81)
RDW: 14.3 % (ref 11.5–15.5)
WBC: 5.6 10*3/uL (ref 4.0–10.5)

## 2021-04-27 LAB — HEPATIC FUNCTION PANEL
ALT: 19 U/L (ref 0–53)
AST: 27 U/L (ref 0–37)
Albumin: 3.9 g/dL (ref 3.5–5.2)
Alkaline Phosphatase: 132 U/L — ABNORMAL HIGH (ref 39–117)
Bilirubin, Direct: 0.1 mg/dL (ref 0.0–0.3)
Total Bilirubin: 0.4 mg/dL (ref 0.2–1.2)
Total Protein: 8.4 g/dL — ABNORMAL HIGH (ref 6.0–8.3)

## 2021-04-27 LAB — VITAMIN B12: Vitamin B-12: 713 pg/mL (ref 211–911)

## 2021-04-27 LAB — MAGNESIUM: Magnesium: 1.5 mg/dL (ref 1.5–2.5)

## 2021-04-27 LAB — LIPASE: Lipase: 32 U/L (ref 11.0–59.0)

## 2021-04-27 LAB — FOLATE: Folate: >23.5 ng/mL (ref >5.9)

## 2021-04-27 MED ORDER — THIAMINE HCL 100 MG PO TABS: 100.0000 mg | ORAL_TABLET | Freq: Every day | ORAL | 1 refills | Status: DC

## 2021-04-27 NOTE — Progress Notes (Signed)
? ?Subjective:  ?Patient ID: Scott Lindsey, male    DOB: 14-Nov-1955  Age: 66 y.o. MRN: [id number] ? ?CC: Anemia ? ? ?HPI ?[name] presents for f/up - He continues to abstain from alcohol.  He is only taking 1 Librium a day.  He denies abdominal pain, nausea, vomiting, loss of appetite, chest pain, shortness of breath, fever, or chills. ? ?Outpatient Medications Prior to Visit  ?Medication Sig Dispense Refill  ? atorvastatin (LIPITOR) 20 MG tablet Take 1 tablet by mouth once daily 90 tablet 1  ? chlordiazePOXIDE (LIBRIUM) 5 MG capsule Take 1 capsule (5 mg total) by mouth 3 (three) times daily as needed for up to 14 days for anxiety. 42 capsule 0  ? cloNIDine (CATAPRES) 0.1 MG tablet TAKE 1 TABLET BY MOUTH THREE TIMES DAILY 505 tablet 0  ? folic acid (FOLVITE) 1 MG tablet Take 1 tablet by mouth once daily 90 tablet 1  ? magnesium oxide (MAG-OX) 400 (241.3 Mg) MG tablet Take 1 tablet (400 mg total) by mouth 2 (two) times daily. 180 tablet 3  ? metoprolol succinate (TOPROL-XL) 25 MG 24 hr tablet Take 1 tablet (25 mg total) by mouth daily. 90 tablet 3  ? Multiple Vitamin (MULTIVITAMIN WITH MINERALS) TABS tablet Take 1 tablet by mouth daily.    ? nicotine (NICODERM CQ - DOSED IN MG/24 HOURS) 14 mg/24hr patch Place 1 patch (14 mg total) onto the skin daily. 28 patch 0  ? losartan (COZAAR) 100 MG tablet TAKE 1 TABLET BY MOUTH ONCE DAILY . APPOINTMENT REQUIRED FOR FUTURE REFILLS 30 tablet 0  ? thiamine 100 MG tablet Take 1 tablet (100 mg total) by mouth daily. 30 tablet 0  ? ?No facility-administered medications prior to visit.  ? ? ?ROS ?Review of Systems  ?Constitutional:  Negative for appetite change, chills, diaphoresis, fatigue and fever.  ?HENT: Negative.    ?Eyes: Negative.   ?Respiratory:  Negative for cough, chest tightness, shortness of breath and wheezing.   ?Cardiovascular:  Negative for chest pain, palpitations and leg swelling.  ?Gastrointestinal:  Negative for abdominal pain, constipation, diarrhea,  nausea and vomiting.  ?Endocrine: Negative.   ?Genitourinary: Negative.  Negative for difficulty urinating.  ?Musculoskeletal: Negative.   ?Skin:  Negative for color change.  ?Neurological:  Negative for dizziness, weakness and light-headedness.  ?Hematological:  Negative for adenopathy. Does not bruise/bleed easily.  ?Psychiatric/Behavioral: Negative.    ? ?Objective:  ?BP 130/64 (BP Location: Right Arm, Patient Position: Sitting, Cuff Size: Large)   Temp 98.2 ?F (36.8 ?C) (Oral)   Ht '5\' 8"'$  (1.727 m)   Wt 149 lb (67.6 kg)   BMI 22.66 kg/m?  ? ?BP Readings from Last 3 Encounters:  ?04/27/21 130/64  ?04/13/21 136/82  ?04/12/21 132/72  ? ? ?Wt Readings from Last 3 Encounters:  ?04/27/21 149 lb (67.6 kg)  ?04/13/21 150 lb (68 kg)  ?04/10/21 147 lb 0.8 oz (66.7 kg)  ? ? ?Physical Exam ?Vitals reviewed.  ?HENT:  ?   Nose: Nose normal.  ?   Mouth/Throat:  ?   Mouth: Mucous membranes are moist.  ?Eyes:  ?   General: No scleral icterus. ?   Conjunctiva/sclera: Conjunctivae normal.  ?Cardiovascular:  ?   Rate and Rhythm: Normal rate and regular rhythm.  ?   Heart sounds: No murmur heard. ?Pulmonary:  ?   Effort: Pulmonary effort is normal.  ?   Breath sounds: No stridor. No wheezing, rhonchi or rales.  ?Abdominal:  ?  General: Abdomen is flat. Bowel sounds are normal. There is no distension.  ?   Palpations: There is no hepatomegaly, splenomegaly or mass.  ?   Tenderness: There is no abdominal tenderness. There is no guarding.  ?   Hernia: No hernia is present.  ?Musculoskeletal:     ?   General: Normal range of motion.  ?   Cervical back: Neck supple.  ?   Right lower leg: No edema.  ?   Left lower leg: No edema.  ?Lymphadenopathy:  ?   Cervical: No cervical adenopathy.  ?Skin: ?   General: Skin is warm and dry.  ?Neurological:  ?   General: No focal deficit present.  ?   Mental Status: He is alert.  ?Psychiatric:     ?   Mood and Affect: Mood normal.     ?   Behavior: Behavior normal.  ? ? ?Lab Results  ?Component  Value Date  ? WBC 5.6 04/27/2021  ? HGB 12.0 (L) 04/27/2021  ? HCT 35.9 (L) 04/27/2021  ? PLT 201.0 04/27/2021  ? GLUCOSE 88 04/12/2021  ? CHOL 115 04/12/2021  ? TRIG 68 04/12/2021  ? HDL 49 04/12/2021  ? [place] 52 04/12/2021  ? ALT 19 04/27/2021  ? AST 27 04/27/2021  ? NA 138 04/12/2021  ? K 3.5 04/12/2021  ? CL 105 04/12/2021  ? CREATININE 1.04 04/12/2021  ? BUN 10 04/12/2021  ? CO2 25 04/12/2021  ? TSH 1.45 08/09/2020  ? PSA 0.00 (L) 08/09/2020  ? ? ?CT ABDOMEN PELVIS W CONTRAST ? ?Result Date: 04/10/2021 ?CLINICAL DATA:  Abdominal pain, acute, nonlocalized. History of pancreatitis. EXAM: CT ABDOMEN AND PELVIS WITH CONTRAST TECHNIQUE: Multidetector CT imaging of the abdomen and pelvis was performed using the standard protocol following bolus administration of intravenous contrast. RADIATION DOSE REDUCTION: This exam was performed according to the departmental dose-optimization program which includes automated exposure control, adjustment of the mA and/or kV according to patient size and/or use of iterative reconstruction technique. CONTRAST:  133m OMNIPAQUE IOHEXOL 300 MG/ML  SOLN COMPARISON:  06/26/2020 FINDINGS: Lower chest: Chronic fibrotic and emphysematous change at the lung bases, right worse than left. No evidence of acute consolidation or pleural effusion. Hepatobiliary: Liver parenchyma is normal.  No calcified gallstones. Pancreas: Acute inflammatory change of the body and head of the pancreas with surrounding edema. No evidence of pseudocyst or abscess. Spleen: Normal Adrenals/Urinary Tract: Adrenal glands are normal. Mild atrophic change of the right kidney. Several small calcifications which could be vascular or small nonobstructing stones. The left kidney also shows scattered calcifications that could be vascular or small stones. No hydronephrosis on either side. The bladder is normal. Stomach/Bowel: Stomach and small intestine are normal. Normal appendix. No colon pathology. Vascular/Lymphatic:  Aortic atherosclerosis, pronounced. No aneurysm. IVC is normal. No adenopathy. Reproductive: Normal Other: No free fluid or air. Musculoskeletal: Chronic lumbar degenerative changes. IMPRESSION: Acute pancreatitis of the head and proximal body. No pseudocyst or abscess. Chronic emphysematous and fibrotic changes at the lung bases. Aortic atherosclerosis, advanced. Small nonobstructing renal calculi and/or vascular calcifications. Electronically Signed   By: MNelson ChimesM.D.   On: 04/10/2021 14:43  ? ? ?Assessment & Plan:  ? ?DUlrickwas seen today for anemia. ? ?Diagnoses and all orders for this visit: ? ?Elevated total protein- Will recheck this the next time I see him. ? ?Alcohol-induced acute pancreatitis without infection or necrosis- Improvement noted. ?-     Hepatic function panel ?-  CBC with Differential/Platelet ?-     Lipase ? ?Dietary folate deficiency anemia- His H&H have improved. ?-     Folate ?-     CBC with Differential/Platelet ?-     Vitamin B12 ? ?Hypomagnesemia- His magnesium level is normal now. ?-     Magnesium ? ?Thiamine deficiency ?-     thiamine 100 MG tablet; Take 1 tablet (100 mg total) by mouth daily. ? ? ?I am having Garen Lah. Nedved maintain his magnesium oxide, multivitamin with minerals, cloNIDine, metoprolol succinate, atorvastatin, folic acid, nicotine, and thiamine. ? ?Meds ordered this encounter  ?Medications  ? thiamine 100 MG tablet  ?  Sig: Take 1 tablet (100 mg total) by mouth daily.  ?  Dispense:  90 tablet  ?  Refill:  1  ? ? ? ?Follow-up: No follow-ups on file. ? ?Scarlette Calico, MD ?

## 2021-04-28 ENCOUNTER — Encounter: Payer: Self-pay | Admitting: Internal Medicine

## 2021-04-28 ENCOUNTER — Other Ambulatory Visit: Payer: Self-pay | Admitting: Cardiology

## 2021-04-28 DIAGNOSIS — R778 Other specified abnormalities of plasma proteins: Secondary | ICD-10-CM | POA: Insufficient documentation

## 2021-05-31 ENCOUNTER — Other Ambulatory Visit: Payer: Self-pay | Admitting: Cardiology

## 2021-05-31 ENCOUNTER — Other Ambulatory Visit: Payer: Self-pay | Admitting: Internal Medicine

## 2021-05-31 DIAGNOSIS — I10 Essential (primary) hypertension: Secondary | ICD-10-CM

## 2021-06-01 ENCOUNTER — Other Ambulatory Visit: Payer: Self-pay | Admitting: Cardiology

## 2021-06-15 ENCOUNTER — Other Ambulatory Visit: Payer: Self-pay

## 2021-06-15 ENCOUNTER — Other Ambulatory Visit: Payer: Self-pay | Admitting: Cardiology

## 2021-06-16 NOTE — Telephone Encounter (Signed)
This is Dr. Newman Nickels pt. Please address

## 2021-06-16 NOTE — Telephone Encounter (Signed)
*  STAT* If patient is at the pharmacy, call can be transferred to refill team.   1. Which medications need to be refilled? (please list name of each medication and dose if known) MAGnesium-Oxide 400 (240 Mg) MG tablet  2. Which pharmacy/location (including street and city if local pharmacy) is medication to be sent to? [place], [address]  3. Do they need a 30 day or 90 day supply? 30 day  Patient is out of medication

## 2021-06-30 ENCOUNTER — Other Ambulatory Visit: Payer: Self-pay | Admitting: Internal Medicine

## 2021-06-30 DIAGNOSIS — F10930 Alcohol use, unspecified with withdrawal, uncomplicated: Secondary | ICD-10-CM

## 2021-07-05 NOTE — Progress Notes (Signed)
Cardiology Clinic Note   Patient Name: Scott Lindsey Date of Encounter: 07/07/2021  Primary Care Provider:  [name], MD Primary Cardiologist:  [name], MD  Patient Profile    65 year old male with follow-up for hyperlipidemia, hypertension,palpitations with frequent PVCs, history of prostate cancer and alcohol abuse.  He is on lithium.  Zio patch 04/30/2019 revealing 10.4% PVCs which was suspected to be related to hypomagnesemia and alcohol abuse.    He was recommended for alcohol cessation at that time.  The patient remained on clonidine, metoprolol, magnesium supplements, and atorvastatin.  He unfortunately continues to smoke.  Last seen by Dr. Nechama Guard on 04/04/2020 without changes in medication regimen.  Past Medical History    Past Medical History:  Diagnosis Date   Back pain    Past Surgical History:  Procedure Laterality Date   MULTIPLE TOOTH EXTRACTIONS     ROBOT ASSISTED LAPAROSCOPIC RADICAL PROSTATECTOMY  12/13/2010   Procedure: ROBOTIC ASSISTED LAPAROSCOPIC RADICAL PROSTATECTOMY;  Surgeon: Bernestine Amass, MD;  Location: [facility];  Service: Urology;  Laterality: N/A;  with Bilateral Pelivic Lymph Node Dissection    Allergies  No Known Allergies  History of Present Illness    Scott Lindsey comes today without any complaints we are seeing him for 6 months follow-up concerning hyperlipidemia, hypertension, and palpitations.  The patient has stopped drinking alcohol (sober for 4 months).  He states that he was recently hospitalized with severe abdominal pain and had stopped drinking during that time and never returned to this.  He denies any further palpitations, has more energy, is sleeping better, denies any chest discomfort or dyspnea.  Home Medications    Current Outpatient Medications  Medication Sig Dispense Refill   chlordiazePOXIDE (LIBRIUM) 5 MG capsule TAKE 1 CAPSULE BY MOUTH THREE TIMES DAILY AS NEEDED FOR UP TO 14 DAYS FOR ANXIETY 42 capsule 0    folic acid (FOLVITE) 1 MG tablet Take 1 tablet by mouth once daily 90 tablet 1   magnesium oxide (MAG-OX) 400 (241.3 Mg) MG tablet Take 1 tablet (400 mg total) by mouth 2 (two) times daily. 180 tablet 3   MAGnesium-Oxide 400 (240 Mg) MG tablet Take 1 tablet (400 mg total) by mouth 2 (two) times daily. PATIENT MUST ATTEND APPOINTMENT FOR FUTURE REFILLS. 41 tablet 0   Multiple Vitamin (MULTIVITAMIN WITH MINERALS) TABS tablet Take 1 tablet by mouth daily.     nicotine (NICODERM CQ - DOSED IN MG/24 HOURS) 14 mg/24hr patch Place 1 patch (14 mg total) onto the skin daily. 28 patch 0   thiamine 100 MG tablet Take 1 tablet (100 mg total) by mouth daily. 90 tablet 1   atorvastatin (LIPITOR) 20 MG tablet Take 1 tablet (20 mg total) by mouth daily. 90 tablet 3   cloNIDine (CATAPRES) 0.1 MG tablet Take 1 tablet (0.1 mg total) by mouth 3 (three) times daily. 270 tablet 0   losartan (COZAAR) 100 MG tablet TAKE 1 TABLET BY MOUTH ONCE DAILY . APPOINTMENT REQUIRED FOR FUTURE REFILLS 90 tablet 3   metoprolol succinate (TOPROL-XL) 25 MG 24 hr tablet Take 1 tablet (25 mg total) by mouth daily. 90 tablet 3   No current facility-administered medications for this visit.     Family History    Family History  Problem Relation Age of Onset   Prostate cancer Brother    He indicated that the status of his brother is unknown.  Social History    Social History   Socioeconomic History  Marital status: Married    Spouse name: Not on file   Number of children: Not on file   Years of education: Not on file   Highest education level: Not on file  Occupational History   Not on file  Tobacco Use   Smoking status: Every Day    Packs/day: 0.50    Years: 25.00    Total pack years: 12.50    Types: Cigarettes   Smokeless tobacco: Never  Substance and Sexual Activity   Alcohol use: Yes    Alcohol/week: 15.0 standard drinks of alcohol    Types: 15 Shots of liquor per week    Comment: occasional   Drug use: No    Sexual activity: Yes    Partners: Female  Other Topics Concern   Not on file  Social History Narrative   Not on file   Social Determinants of Health   Financial Resource Strain: Not on file  Food Insecurity: Not on file  Transportation Needs: Not on file  Physical Activity: Not on file  Stress: Not on file  Social Connections: Not on file  Intimate Partner Violence: Not on file     Review of Systems    General:  No chills, fever, night sweats or weight changes.  Cardiovascular:  No chest pain, dyspnea on exertion, edema, orthopnea, palpitations, paroxysmal nocturnal dyspnea. Dermatological: No rash, lesions/masses Respiratory: No cough, dyspnea Urologic: No hematuria, dysuria Abdominal:   No nausea, vomiting, diarrhea, bright red blood per rectum, melena, or hematemesis Neurologic:  No visual changes, wkns, changes in mental status. All other systems reviewed and are otherwise negative except as noted above.     Physical Exam    VS:  BP 130/70 (BP Location: Left Arm)   Pulse 76   Ht '5\' 8"'$  (1.727 m)   Wt 151 lb 9.6 oz (68.8 kg)   SpO2 (!) 85%   BMI 23.05 kg/m  , BMI Body mass index is 23.05 kg/m.     GEN: Well nourished, well developed, in no acute distress. HEENT: normal. Neck: Supple, no JVD, carotid bruits, or masses. Cardiac: RRR, no murmurs, rubs, or gallops. No clubbing, cyanosis, edema.  Radials/DP/PT 2+ and equal bilaterally.  Respiratory:  Respirations regular and unlabored, clear to auscultation bilaterally. GI: Soft, nontender, nondistended, BS + x 4. MS: no deformity or atrophy. Skin: warm and dry, no rash. Neuro:  Strength and sensation are intact. Psych: Normal affect.  Accessory Clinical Findings      Lab Results  Component Value Date   WBC 5.6 04/27/2021   HGB 12.0 (L) 04/27/2021   HCT 35.9 (L) 04/27/2021   MCV 99.4 04/27/2021   PLT 201.0 04/27/2021   Lab Results  Component Value Date   CREATININE 1.04 04/12/2021   BUN 10 04/12/2021    NA 138 04/12/2021   K 3.5 04/12/2021   CL 105 04/12/2021   CO2 25 04/12/2021   Lab Results  Component Value Date   ALT 19 04/27/2021   AST 27 04/27/2021   ALKPHOS 132 (H) 04/27/2021   BILITOT 0.4 04/27/2021   Lab Results  Component Value Date   CHOL 115 04/12/2021   HDL 49 04/12/2021   LDLCALC 52 04/12/2021   TRIG 68 04/12/2021   CHOLHDL 2.3 04/12/2021    No results found for: "HGBA1C"  Review of Prior Studies: Echocardiogram 04/28/2019 1. Left ventricular ejection fraction, by estimation, is 55 to 60%. Left  ventricular ejection fraction by 3D volume is 58 %. The left ventricle  has  normal function. The left ventricle has no regional wall motion  abnormalities. Left ventricular diastolic   parameters are consistent with Grade I diastolic dysfunction (impaired  relaxation).   2. Right ventricular systolic function is normal. The right ventricular  size is normal. There is normal pulmonary artery systolic pressure. The  estimated right ventricular systolic pressure is 65.7 mmHg.   3. The mitral valve is grossly normal. Mild mitral valve regurgitation.  No evidence of mitral stenosis.   4. The aortic valve is tricuspid. Aortic valve regurgitation is not  visualized. No aortic stenosis is present.   5. The inferior vena cava is normal in size with greater than 50%  respiratory variability, suggesting right atrial pressure of 3 mmHg.   Assessment & Plan   1.  Hypertension:  currently well controlled on medication regimen to include losartan 100 mg daily (refills provided), metoprolol 25 mg at at bedtime, clonidine 0.1 mg 3 times daily.  We will need to monitor blood pressure control on these multiple medications with alcohol cessation, and may need down titration.  He is due to follow-up with PCP in July 2023 at which time adjustments can be made if warranted.  2.  Palpitations: No complaints of palpitations with the use of metoprolol.   3.  Hyperlipidemia: Goal of LDL  less than 70.  Currently 66 on atorvastatin 20 mg daily.  LFTs are within normal limits.  Slightly elevated alkaline phosphatase.  No changes in regimen.  4.  Ongoing tobacco abuse: Cessation recommended.    Current medicines are reviewed at length with the patient today.  I have spent 25 min's  dedicated to the care of this patient on the date of this encounter to include pre-visit review of records, assessment, management and diagnostic testing,with shared decision making. Signed, Phill Myron. West Pugh, ANP, AACC   07/07/2021 12:56 PM    [facility] [place] [address] Office [phone] Fax [phone]  Notice: This dictation was prepared with Dragon dictation along with smaller phrase technology. Any transcriptional errors that result from this process are unintentional and may not be corrected upon review.

## 2021-07-06 ENCOUNTER — Other Ambulatory Visit: Payer: Self-pay | Admitting: Internal Medicine

## 2021-07-06 DIAGNOSIS — F10930 Alcohol use, unspecified with withdrawal, uncomplicated: Secondary | ICD-10-CM

## 2021-07-07 ENCOUNTER — Ambulatory Visit (INDEPENDENT_AMBULATORY_CARE_PROVIDER_SITE_OTHER): Payer: Medicare Other | Admitting: Adult Health

## 2021-07-07 ENCOUNTER — Encounter: Payer: Self-pay | Admitting: Adult Health

## 2021-07-07 VITALS — BP 130/70 | HR 76 | Ht 68.0 in | Wt 151.6 lb

## 2021-07-07 DIAGNOSIS — Z720 Tobacco use: Secondary | ICD-10-CM | POA: Diagnosis not present

## 2021-07-07 DIAGNOSIS — I10 Essential (primary) hypertension: Secondary | ICD-10-CM | POA: Diagnosis not present

## 2021-07-07 DIAGNOSIS — E7800 Pure hypercholesterolemia, unspecified: Secondary | ICD-10-CM

## 2021-07-07 DIAGNOSIS — E785 Hyperlipidemia, unspecified: Secondary | ICD-10-CM | POA: Diagnosis not present

## 2021-07-07 MED ORDER — ATORVASTATIN CALCIUM 20 MG PO TABS: 20.0000 mg | ORAL_TABLET | Freq: Every day | ORAL | 3 refills | Status: DC

## 2021-07-07 MED ORDER — LOSARTAN POTASSIUM 100 MG PO TABS: ORAL_TABLET | ORAL | 3 refills | Status: DC

## 2021-07-07 MED ORDER — CLONIDINE HCL 0.1 MG PO TABS: 0.1000 mg | ORAL_TABLET | Freq: Three times a day (TID) | ORAL | 0 refills | Status: DC

## 2021-07-07 MED ORDER — METOPROLOL SUCCINATE ER 25 MG PO TB24: 25.0000 mg | ORAL_TABLET | Freq: Every day | ORAL | 3 refills | Status: DC

## 2021-07-07 NOTE — Patient Instructions (Addendum)
Medication Instructions:  No Changes *If you need a refill on your cardiac medications before your next appointment, please call your pharmacy*   Lab Work: No Labs If you have labs (blood work) drawn today and your tests are completely normal, you will receive your results only by: [place] (if you have MyChart) OR A paper copy in the mail If you have any lab test that is abnormal or we need to change your treatment, we will call you to review the results.   Testing/Procedures: No Testing   Follow-Up: At [facility], you and your health needs are our priority.  As part of our continuing mission to provide you with exceptional heart care, we have created designated Provider Care Teams.  These Care Teams include your primary Cardiologist (physician) and Advanced Practice Providers (APPs -  Physician Assistants and Nurse Practitioners) who all work together to provide you with the care you need, when you need it.  We recommend signing up for the patient portal called "MyChart".  Sign up information is provided on this After Visit Summary.  MyChart is used to connect with patients for Virtual Visits (Telemedicine).  Patients are able to view lab/test results, encounter notes, upcoming appointments, etc.  Non-urgent messages can be sent to your provider as well.   To learn more about what you can do with MyChart, go to [email or web address].    Your next appointment:   1 year  The format for your next appointment:   In Person  Provider:   [name], MD       Important Information About Sugar

## 2021-07-25 ENCOUNTER — Other Ambulatory Visit: Payer: Self-pay | Admitting: Internal Medicine

## 2021-07-25 DIAGNOSIS — D520 Dietary folate deficiency anemia: Secondary | ICD-10-CM

## 2021-08-02 ENCOUNTER — Other Ambulatory Visit: Payer: Self-pay | Admitting: Cardiology

## 2021-08-03 ENCOUNTER — Encounter: Payer: Self-pay | Admitting: Internal Medicine

## 2021-08-03 ENCOUNTER — Ambulatory Visit (INDEPENDENT_AMBULATORY_CARE_PROVIDER_SITE_OTHER): Payer: Medicare Other | Admitting: Internal Medicine

## 2021-08-03 VITALS — BP 156/74 | HR 54 | Temp 97.8°F | Ht 68.0 in | Wt 153.0 lb

## 2021-08-03 DIAGNOSIS — R001 Bradycardia, unspecified: Secondary | ICD-10-CM

## 2021-08-03 DIAGNOSIS — R778 Other specified abnormalities of plasma proteins: Secondary | ICD-10-CM

## 2021-08-03 DIAGNOSIS — E876 Hypokalemia: Secondary | ICD-10-CM | POA: Diagnosis not present

## 2021-08-03 DIAGNOSIS — E519 Thiamine deficiency, unspecified: Secondary | ICD-10-CM | POA: Diagnosis not present

## 2021-08-03 DIAGNOSIS — T50905A Adverse effect of unspecified drugs, medicaments and biological substances, initial encounter: Secondary | ICD-10-CM | POA: Diagnosis not present

## 2021-08-03 DIAGNOSIS — I10 Essential (primary) hypertension: Secondary | ICD-10-CM

## 2021-08-03 DIAGNOSIS — D520 Dietary folate deficiency anemia: Secondary | ICD-10-CM

## 2021-08-03 DIAGNOSIS — Z23 Encounter for immunization: Secondary | ICD-10-CM

## 2021-08-03 LAB — BASIC METABOLIC PANEL
BUN: 10 mg/dL (ref 6–23)
CO2: 25 mEq/L (ref 19–32)
Calcium: 9.8 mg/dL (ref 8.4–10.5)
Chloride: 103 mEq/L (ref 96–112)
Creatinine, Ser: 1.1 mg/dL (ref 0.40–1.50)
GFR: 69.13 mL/min (ref >60.00)
Glucose, Bld: 78 mg/dL (ref 70–99)
Potassium: 4.1 mEq/L (ref 3.5–5.1)
Sodium: 139 mEq/L (ref 135–145)

## 2021-08-03 LAB — CBC WITH DIFFERENTIAL/PLATELET
Basophils Absolute: 0 10*3/uL (ref 0.0–0.1)
Basophils Relative: 0.7 % (ref 0.0–3.0)
Eosinophils Absolute: 0.1 10*3/uL (ref 0.0–0.7)
Eosinophils Relative: 1.5 % (ref 0.0–5.0)
HCT: 38.1 % — ABNORMAL LOW (ref 39.0–52.0)
Hemoglobin: 12.8 g/dL — ABNORMAL LOW (ref 13.0–17.0)
Lymphocytes Relative: 36.5 % (ref 12.0–46.0)
Lymphs Abs: 2.2 10*3/uL (ref 0.7–4.0)
MCHC: 33.5 g/dL (ref 30.0–36.0)
MCV: 93.3 fl (ref 78.0–100.0)
Monocytes Absolute: 0.6 10*3/uL (ref 0.1–1.0)
Monocytes Relative: 10.3 % (ref 3.0–12.0)
Neutro Abs: 3.1 10*3/uL (ref 1.4–7.7)
Neutrophils Relative %: 51 % (ref 43.0–77.0)
Platelets: 148 10*3/uL — ABNORMAL LOW (ref 150.0–400.0)
RBC: 4.08 Mil/uL — ABNORMAL LOW (ref 4.22–5.81)
RDW: 14.8 % (ref 11.5–15.5)
WBC: 6.1 10*3/uL (ref 4.0–10.5)

## 2021-08-03 LAB — HEPATIC FUNCTION PANEL
ALT: 14 U/L (ref 0–53)
AST: 24 U/L (ref 0–37)
Albumin: 4.3 g/dL (ref 3.5–5.2)
Alkaline Phosphatase: 156 U/L — ABNORMAL HIGH (ref 39–117)
Bilirubin, Direct: 0.1 mg/dL (ref 0.0–0.3)
Total Bilirubin: 0.5 mg/dL (ref 0.2–1.2)
Total Protein: 8.8 g/dL — ABNORMAL HIGH (ref 6.0–8.3)

## 2021-08-03 LAB — TSH: TSH: 1.78 u[IU]/mL (ref 0.35–5.50)

## 2021-08-03 LAB — MAGNESIUM: Magnesium: 1.6 mg/dL (ref 1.5–2.5)

## 2021-08-03 NOTE — Patient Instructions (Signed)
Bradycardia, Adult Bradycardia is a slower-than-normal heartbeat. A normal resting heart rate for an adult ranges from 60 to 100 beats per minute. With bradycardia, the resting heart rate is less than 60 beats per minute. Bradycardia can prevent enough oxygen from reaching certain areas of your body when you are active. It can be serious if it keeps enough oxygen from reaching your brain and other parts of your body. Bradycardia is not a problem for everyone. For some healthy adults, a slow resting heart rate is normal. What are the causes? This condition may be caused by: A problem with the heart, including: A problem with the heart's electrical system, such as a heart block. With a heart block, electrical signals between the chambers of the heart are partially or completely blocked, so they are not able to work as they should. A problem with the heart's natural pacemaker (sinus node). Heart disease. A heart attack. Heart damage. Lyme disease. A heart infection. A heart condition that is present at birth (congenital heart defect). Certain medicines that treat heart conditions. Certain conditions, such as hypothyroidism and obstructive sleep apnea. Problems with the balance of chemicals and other substances, like potassium, in the blood. Trauma. Radiation therapy. What increases the risk? You are more likely to develop this condition if you: Are age 65 or older. Have high blood pressure (hypertension), high cholesterol (hyperlipidemia), or diabetes. Drink heavily, use tobacco or nicotine products, or use drugs. What are the signs or symptoms? Symptoms of this condition include: Light-headedness. Feeling faint or fainting. Fatigue and weakness. Trouble with activity or exercise. Shortness of breath. Chest pain (angina). Drowsiness. Confusion. Dizziness. How is this diagnosed? This condition may be diagnosed based on: Your symptoms. Your medical history. A physical exam. During  the exam, your health care provider will listen to your heartbeat and check your pulse. To confirm the diagnosis, your health care provider may order tests, such as: Blood tests. An electrocardiogram (ECG). This test records the heart's electrical activity. The test can show how fast your heart is beating and whether the heartbeat is steady. A test in which you wear a portable device (event recorder or Holter monitor) to record your heart's electrical activity while you go about your day. An exercise test. How is this treated? Treatment for this condition depends on the cause of the condition and how severe your symptoms are. Treatment may involve: Treatment of the underlying condition. Changing your medicines or how much medicine you take. Having a small, battery-operated device called a pacemaker implanted under the skin. When bradycardia occurs, this device can be used to increase your heart rate and help your heart beat in a regular rhythm. Follow these instructions at home: Lifestyle Manage any health conditions that contribute to bradycardia as told by your health care provider. Follow a heart-healthy diet. A nutrition specialist (dietitian) can help educate you about healthy food options and changes. Follow an exercise program that is approved by your health care provider. Maintain a healthy weight. Try to reduce or manage your stress, such as with yoga or meditation. If you need help reducing stress, ask your health care provider. Do not use any products that contain nicotine or tobacco. These products include cigarettes, chewing tobacco, and vaping devices, such as e-cigarettes. If you need help quitting, ask your health care provider. Do not use illegal drugs. Alcohol use If you drink alcohol: Limit how much you have to: 0-1 drink a day for women who are not pregnant. 0-2 drinks a day   for men. Know how much alcohol is in a drink. In the [place] one drink equals one 12 oz bottle of  beer (355 mL), one 5 oz glass of wine (148 mL), or one 1 oz glass of hard liquor (44 mL). General instructions Take over-the-counter and prescription medicines only as told by your health care provider. Keep all follow-up visits. This is important. How is this prevented? In some cases, bradycardia may be prevented by: Treating underlying medical problems. Stopping behaviors or medicines that can trigger the condition. Contact a health care provider if: You feel light-headed or dizzy. You almost faint. You feel weak or are easily fatigued during physical activity. You experience confusion or have memory problems. Get help right away if: You faint. You have chest pains or an irregular heartbeat (palpitations). You have trouble breathing. These symptoms may represent a serious problem that is an emergency. Do not wait to see if the symptoms will go away. Get medical help right away. Call your local emergency services (911 in the [place] Do not drive yourself to the hospital. Summary Bradycardia is a slower-than-normal heartbeat. With bradycardia, the resting heart rate is less than 60 beats per minute. Treatment for this condition depends on the cause. Manage any health conditions that contribute to bradycardia as told by your health care provider. Do not use any products that contain nicotine or tobacco. These products include cigarettes, chewing tobacco, and vaping devices, such as e-cigarettes. Keep all follow-up visits. This is important. This information is not intended to replace advice given to you by your health care provider. Make sure you discuss any questions you have with your health care provider. Document Revised: 04/17/2020 Document Reviewed: 04/17/2020 Elsevier Patient Education  2023 [employer]  

## 2021-08-03 NOTE — Progress Notes (Signed)
Subjective:  Patient ID: Scott Lindsey, male    DOB: 1955-02-10  Age: 66 y.o. MRN: [id number]  CC: Anemia and Hypertension   HPI HOLLEY WIRT presents for f/up -  He tells me that since I last saw him he has abstained from alcohol and is no longer taking the benzodiazepine.  He continues to complain of nonproductive cough.  He denies chest pain, hemoptysis, shortness of breath, wheezing, diaphoresis, or edema.  Outpatient Medications Prior to Visit  Medication Sig Dispense Refill   atorvastatin (LIPITOR) 20 MG tablet Take 1 tablet (20 mg total) by mouth daily. 90 tablet 3   cloNIDine (CATAPRES) 0.1 MG tablet Take 1 tablet (0.1 mg total) by mouth 3 (three) times daily. 016 tablet 0   folic acid (FOLVITE) 1 MG tablet Take 1 tablet by mouth once daily 90 tablet 0   losartan (COZAAR) 100 MG tablet TAKE 1 TABLET BY MOUTH ONCE DAILY . APPOINTMENT REQUIRED FOR FUTURE REFILLS 90 tablet 3   magnesium oxide (MAG-OX) 400 (241.3 Mg) MG tablet Take 1 tablet (400 mg total) by mouth 2 (two) times daily. 180 tablet 3   MAGNESIUM-OXIDE 400 (240 Mg) MG tablet TAKE 1 TABLET BY MOUTH TWICE DAILY. PATIENT MUST ATTEND APPOINTMENT FOR FUTURE REFILLS. 41 tablet 0   metoprolol succinate (TOPROL-XL) 25 MG 24 hr tablet Take 1 tablet (25 mg total) by mouth daily. 90 tablet 3   Multiple Vitamin (MULTIVITAMIN WITH MINERALS) TABS tablet Take 1 tablet by mouth daily.     nicotine (NICODERM CQ - DOSED IN MG/24 HOURS) 14 mg/24hr patch Place 1 patch (14 mg total) onto the skin daily. 28 patch 0   thiamine 100 MG tablet Take 1 tablet (100 mg total) by mouth daily. 90 tablet 1   chlordiazePOXIDE (LIBRIUM) 5 MG capsule TAKE 1 CAPSULE BY MOUTH THREE TIMES DAILY AS NEEDED FOR UP TO 14 DAYS FOR ANXIETY 42 capsule 0   No facility-administered medications prior to visit.    ROS Review of Systems  Constitutional: Negative.  Negative for chills, diaphoresis, fatigue and fever.  HENT: Negative.    Eyes: Negative.    Respiratory:  Positive for cough. Negative for chest tightness, shortness of breath and wheezing.   Cardiovascular:  Negative for chest pain, palpitations and leg swelling.  Gastrointestinal:  Negative for abdominal pain, constipation, diarrhea, nausea and vomiting.  Endocrine: Negative.   Genitourinary: Negative.  Negative for difficulty urinating and dysuria.  Musculoskeletal: Negative.  Negative for joint swelling.  Skin: Negative.  Negative for color change and pallor.  Neurological:  Negative for dizziness, weakness and light-headedness.  Hematological:  Negative for adenopathy. Does not bruise/bleed easily.  Psychiatric/Behavioral: Negative.      Objective:  BP (!) 156/74 (BP Location: Left Arm, Patient Position: Sitting, Cuff Size: Large)   Pulse (!) 54   Temp 97.8 F (36.6 C) (Oral)   Ht '5\' 8"'$  (1.727 m)   Wt 153 lb (69.4 kg)   SpO2 93%   BMI 23.26 kg/m   BP Readings from Last 3 Encounters:  08/03/21 (!) 156/74  07/07/21 130/70  04/27/21 130/64    Wt Readings from Last 3 Encounters:  08/03/21 153 lb (69.4 kg)  07/07/21 151 lb 9.6 oz (68.8 kg)  04/27/21 149 lb (67.6 kg)    Physical Exam Vitals reviewed.  HENT:     Nose: Nose normal.  Eyes:     General: No scleral icterus.    Conjunctiva/sclera: Conjunctivae normal.  Cardiovascular:  Rate and Rhythm: Regular rhythm. Bradycardia present.     Heart sounds: No murmur heard.    Comments: EKG- SB, 49 bpm Otherwise normal EKG Pulmonary:     Effort: Pulmonary effort is normal.     Breath sounds: No stridor. No wheezing, rhonchi or rales.  Abdominal:     General: Abdomen is flat.     Palpations: There is no mass.     Tenderness: There is no abdominal tenderness. There is no guarding.  Musculoskeletal:        General: Normal range of motion.     Cervical back: Neck supple.     Right lower leg: No edema.     Left lower leg: No edema.  Skin:    General: Skin is warm and dry.  Neurological:     General: No  focal deficit present.     Mental Status: He is alert. Mental status is at baseline.  Psychiatric:        Mood and Affect: Mood normal.        Behavior: Behavior normal.     Lab Results  Component Value Date   WBC 6.1 08/03/2021   HGB 12.8 (L) 08/03/2021   HCT 38.1 (L) 08/03/2021   PLT 148.0 (L) 08/03/2021   GLUCOSE 78 08/03/2021   CHOL 115 04/12/2021   TRIG 68 04/12/2021   HDL 49 04/12/2021   LDLCALC 52 04/12/2021   ALT 14 08/03/2021   AST 24 08/03/2021   NA 139 08/03/2021   K 4.1 08/03/2021   CL 103 08/03/2021   CREATININE 1.10 08/03/2021   BUN 10 08/03/2021   CO2 25 08/03/2021   TSH 1.78 08/03/2021   PSA 0.00 (L) 08/09/2020    CT ABDOMEN PELVIS W CONTRAST  Result Date: 04/10/2021 CLINICAL DATA:  Abdominal pain, acute, nonlocalized. History of pancreatitis. EXAM: CT ABDOMEN AND PELVIS WITH CONTRAST TECHNIQUE: Multidetector CT imaging of the abdomen and pelvis was performed using the standard protocol following bolus administration of intravenous contrast. RADIATION DOSE REDUCTION: This exam was performed according to the departmental dose-optimization program which includes automated exposure control, adjustment of the mA and/or kV according to patient size and/or use of iterative reconstruction technique. CONTRAST:  170m OMNIPAQUE IOHEXOL 300 MG/ML  SOLN COMPARISON:  06/26/2020 FINDINGS: Lower chest: Chronic fibrotic and emphysematous change at the lung bases, right worse than left. No evidence of acute consolidation or pleural effusion. Hepatobiliary: Liver parenchyma is normal.  No calcified gallstones. Pancreas: Acute inflammatory change of the body and head of the pancreas with surrounding edema. No evidence of pseudocyst or abscess. Spleen: Normal Adrenals/Urinary Tract: Adrenal glands are normal. Mild atrophic change of the right kidney. Several small calcifications which could be vascular or small nonobstructing stones. The left kidney also shows scattered calcifications  that could be vascular or small stones. No hydronephrosis on either side. The bladder is normal. Stomach/Bowel: Stomach and small intestine are normal. Normal appendix. No colon pathology. Vascular/Lymphatic: Aortic atherosclerosis, pronounced. No aneurysm. IVC is normal. No adenopathy. Reproductive: Normal Other: No free fluid or air. Musculoskeletal: Chronic lumbar degenerative changes. IMPRESSION: Acute pancreatitis of the head and proximal body. No pseudocyst or abscess. Chronic emphysematous and fibrotic changes at the lung bases. Aortic atherosclerosis, advanced. Small nonobstructing renal calculi and/or vascular calcifications. Electronically Signed   By: MNelson ChimesM.D.   On: 04/10/2021 14:43    Assessment & Plan:   DVirglewas seen today for anemia and hypertension.  Diagnoses and all orders for this visit:  Essential hypertension- His blood pressure is not adequately well controlled.  Will add hydralazine. -     Basic metabolic panel; Future -     TSH; Future -     TSH -     Basic metabolic panel -     hydrALAZINE (APRESOLINE) 25 MG tablet; Take 1 tablet (25 mg total) by mouth 3 (three) times daily.  Thiamine deficiency -     CBC with Differential/Platelet; Future -     CBC with Differential/Platelet  Dietary folate deficiency anemia -     CBC with Differential/Platelet; Future -     CBC with Differential/Platelet  Hypomagnesemia- His magnesium level is normal. -     Basic metabolic panel; Future -     Magnesium; Future -     Magnesium -     Basic metabolic panel  Drug-induced hypokalemia -     Basic metabolic panel; Future -     Magnesium; Future -     Magnesium -     Basic metabolic panel  Elevated total protein -     Hepatic function panel; Future -     Hepatic function panel -     Ambulatory referral to Hematology / Oncology  Bradycardia- He is asymptomatic with this.  His TSH is normal. -     TSH; Future -     EKG 12-Lead -     TSH  Need for prophylactic  vaccination and inoculation against varicella -     Zoster Vaccine Adjuvanted [facility]) injection; Inject 0.5 mLs into the muscle once for 1 dose.   I have discontinued Illias Pantano. Zwicker's chlordiazePOXIDE. I am also having him start on Shingrix and hydrALAZINE. Additionally, I am having him maintain his magnesium oxide, multivitamin with minerals, nicotine, thiamine, cloNIDine, losartan, atorvastatin, metoprolol succinate, folic acid, and MAGnesium-Oxide.  Meds ordered this encounter  Medications   Zoster Vaccine Adjuvanted [facility]) injection    Sig: Inject 0.5 mLs into the muscle once for 1 dose.    Dispense:  0.5 mL    Refill:  1   hydrALAZINE (APRESOLINE) 25 MG tablet    Sig: Take 1 tablet (25 mg total) by mouth 3 (three) times daily.    Dispense:  270 tablet    Refill:  0     Follow-up: Return in about 3 months (around 11/03/2021).  Scarlette Calico, MD

## 2021-08-06 MED ORDER — HYDRALAZINE HCL 25 MG PO TABS: 25.0000 mg | ORAL_TABLET | Freq: Three times a day (TID) | ORAL | 0 refills | Status: DC

## 2021-08-06 MED ORDER — SHINGRIX 50 MCG/0.5ML IM SUSR: 0.5000 mL | Freq: Once | INTRAMUSCULAR | 1 refills | Status: AC

## 2021-09-14 NOTE — Telephone Encounter (Signed)
NOTE NOT NEEDED ?

## 2021-09-19 ENCOUNTER — Other Ambulatory Visit: Payer: Self-pay | Admitting: Cardiology

## 2021-10-24 ENCOUNTER — Other Ambulatory Visit: Payer: Self-pay | Admitting: Internal Medicine

## 2021-10-24 DIAGNOSIS — D520 Dietary folate deficiency anemia: Secondary | ICD-10-CM

## 2022-01-19 ENCOUNTER — Other Ambulatory Visit: Payer: Self-pay | Admitting: Internal Medicine

## 2022-01-19 DIAGNOSIS — D520 Dietary folate deficiency anemia: Secondary | ICD-10-CM

## 2022-01-25 ENCOUNTER — Ambulatory Visit (INDEPENDENT_AMBULATORY_CARE_PROVIDER_SITE_OTHER): Payer: Medicare Other | Admitting: Internal Medicine

## 2022-01-25 ENCOUNTER — Ambulatory Visit (INDEPENDENT_AMBULATORY_CARE_PROVIDER_SITE_OTHER): Payer: Medicare Other

## 2022-01-25 ENCOUNTER — Encounter: Payer: Self-pay | Admitting: Internal Medicine

## 2022-01-25 VITALS — BP 160/88 | HR 66 | Temp 97.9°F | Ht 68.0 in | Wt 158.0 lb

## 2022-01-25 DIAGNOSIS — I10 Essential (primary) hypertension: Secondary | ICD-10-CM

## 2022-01-25 DIAGNOSIS — Z23 Encounter for immunization: Secondary | ICD-10-CM | POA: Insufficient documentation

## 2022-01-25 DIAGNOSIS — R053 Chronic cough: Secondary | ICD-10-CM

## 2022-01-25 DIAGNOSIS — D696 Thrombocytopenia, unspecified: Secondary | ICD-10-CM | POA: Diagnosis not present

## 2022-01-25 DIAGNOSIS — I493 Ventricular premature depolarization: Secondary | ICD-10-CM | POA: Diagnosis not present

## 2022-01-25 DIAGNOSIS — J8410 Pulmonary fibrosis, unspecified: Secondary | ICD-10-CM

## 2022-01-25 DIAGNOSIS — R778 Other specified abnormalities of plasma proteins: Secondary | ICD-10-CM

## 2022-01-25 DIAGNOSIS — J410 Simple chronic bronchitis: Secondary | ICD-10-CM | POA: Diagnosis not present

## 2022-01-25 DIAGNOSIS — R052 Subacute cough: Secondary | ICD-10-CM | POA: Insufficient documentation

## 2022-01-25 DIAGNOSIS — E519 Thiamine deficiency, unspecified: Secondary | ICD-10-CM

## 2022-01-25 DIAGNOSIS — R001 Bradycardia, unspecified: Secondary | ICD-10-CM

## 2022-01-25 DIAGNOSIS — D520 Dietary folate deficiency anemia: Secondary | ICD-10-CM | POA: Diagnosis not present

## 2022-01-25 DIAGNOSIS — R059 Cough, unspecified: Secondary | ICD-10-CM | POA: Diagnosis not present

## 2022-01-25 DIAGNOSIS — Z720 Tobacco use: Secondary | ICD-10-CM

## 2022-01-25 LAB — BASIC METABOLIC PANEL
BUN: 13 mg/dL (ref 6–23)
CO2: 23 mEq/L (ref 19–32)
Calcium: 9.8 mg/dL (ref 8.4–10.5)
Chloride: 101 mEq/L (ref 96–112)
Creatinine, Ser: 1.22 mg/dL (ref 0.40–1.50)
GFR: 60.85 mL/min (ref >60.00)
Glucose, Bld: 89 mg/dL (ref 70–99)
Potassium: 3.8 mEq/L (ref 3.5–5.1)
Sodium: 138 mEq/L (ref 135–145)

## 2022-01-25 LAB — HEPATIC FUNCTION PANEL
ALT: 14 U/L (ref 0–53)
AST: 28 U/L (ref 0–37)
Albumin: 4.3 g/dL (ref 3.5–5.2)
Alkaline Phosphatase: 158 U/L — ABNORMAL HIGH (ref 39–117)
Bilirubin, Direct: 0.1 mg/dL (ref 0.0–0.3)
Total Bilirubin: 0.4 mg/dL (ref 0.2–1.2)
Total Protein: 9.1 g/dL — ABNORMAL HIGH (ref 6.0–8.3)

## 2022-01-25 LAB — CBC WITH DIFFERENTIAL/PLATELET
Basophils Absolute: 0 10*3/uL (ref 0.0–0.1)
Basophils Relative: 0.5 % (ref 0.0–3.0)
Eosinophils Absolute: 0.1 10*3/uL (ref 0.0–0.7)
Eosinophils Relative: 1.4 % (ref 0.0–5.0)
HCT: 36.9 % — ABNORMAL LOW (ref 39.0–52.0)
Hemoglobin: 12.7 g/dL — ABNORMAL LOW (ref 13.0–17.0)
Lymphocytes Relative: 32.8 % (ref 12.0–46.0)
Lymphs Abs: 2.3 10*3/uL (ref 0.7–4.0)
MCHC: 34.5 g/dL (ref 30.0–36.0)
MCV: 91.3 fl (ref 78.0–100.0)
Monocytes Absolute: 0.7 10*3/uL (ref 0.1–1.0)
Monocytes Relative: 10.5 % (ref 3.0–12.0)
Neutro Abs: 3.9 10*3/uL (ref 1.4–7.7)
Neutrophils Relative %: 54.8 % (ref 43.0–77.0)
Platelets: 185 10*3/uL (ref 150.0–400.0)
RBC: 4.05 Mil/uL — ABNORMAL LOW (ref 4.22–5.81)
RDW: 15.5 % (ref 11.5–15.5)
WBC: 7.1 10*3/uL (ref 4.0–10.5)

## 2022-01-25 LAB — VITAMIN B12: Vitamin B-12: 753 pg/mL (ref 211–911)

## 2022-01-25 MED ORDER — THIAMINE HCL 100 MG PO TABS: 100.0000 mg | ORAL_TABLET | Freq: Every day | ORAL | 1 refills | Status: DC

## 2022-01-25 MED ORDER — FOLIC ACID 1 MG PO TABS: 1.0000 mg | ORAL_TABLET | Freq: Every day | ORAL | 1 refills | Status: DC

## 2022-01-25 MED ORDER — HYDRALAZINE HCL 50 MG PO TABS: 50.0000 mg | ORAL_TABLET | Freq: Three times a day (TID) | ORAL | 0 refills | Status: DC

## 2022-01-25 NOTE — Progress Notes (Unsigned)
Subjective:  Patient ID: Scott Lindsey, male    DOB: 17-Apr-1955  Age: 67 y.o. MRN: [id number]  CC: Hypertension and Cough   HPI Scott Lindsey presents for f/up -   He complains of a 51-monthhistory of cough productive of clear phlegm.  He complains of dizziness and palpitations.  He denies chest pain, diaphoresis, near-syncope, or edema.  Outpatient Medications Prior to Visit  Medication Sig Dispense Refill   atorvastatin (LIPITOR) 20 MG tablet Take 1 tablet (20 mg total) by mouth daily. 90 tablet 3   cloNIDine (CATAPRES) 0.1 MG tablet Take 1 tablet (0.1 mg total) by mouth 3 (three) times daily. 270 tablet 0   losartan (COZAAR) 100 MG tablet TAKE 1 TABLET BY MOUTH ONCE DAILY . APPOINTMENT REQUIRED FOR FUTURE REFILLS 90 tablet 3   magnesium oxide (MAG-OX) 400 (241.3 Mg) MG tablet Take 1 tablet (400 mg total) by mouth 2 (two) times daily. 180 tablet 3   MAGnesium-Oxide 400 (240 Mg) MG tablet Take 1 tablet (400 mg total) by mouth daily. 90 tablet 3   metoprolol succinate (TOPROL-XL) 25 MG 24 hr tablet Take 1 tablet (25 mg total) by mouth daily. 90 tablet 3   Multiple Vitamin (MULTIVITAMIN WITH MINERALS) TABS tablet Take 1 tablet by mouth daily.     nicotine (NICODERM CQ - DOSED IN MG/24 HOURS) 14 mg/24hr patch Place 1 patch (14 mg total) onto the skin daily. 28 patch 0   folic acid (FOLVITE) 1 MG tablet Take 1 tablet by mouth once daily 90 tablet 0   hydrALAZINE (APRESOLINE) 25 MG tablet Take 1 tablet (25 mg total) by mouth 3 (three) times daily. 270 tablet 0   thiamine 100 MG tablet Take 1 tablet (100 mg total) by mouth daily. 90 tablet 1   No facility-administered medications prior to visit.    ROS Review of Systems  Constitutional:  Negative for chills, diaphoresis, fatigue and fever.  HENT: Negative.    Eyes: Negative.   Respiratory:  Positive for cough. Negative for chest tightness, shortness of breath and wheezing.   Cardiovascular:  Positive for palpitations. Negative for  chest pain and leg swelling.  Gastrointestinal:  Negative for abdominal pain, diarrhea, nausea and vomiting.  Endocrine: Negative.   Genitourinary: Negative.   Musculoskeletal: Negative.  Negative for arthralgias and joint swelling.  Skin: Negative.  Negative for color change.  Neurological:  Positive for dizziness. Negative for syncope, weakness and light-headedness.  Hematological:  Does not bruise/bleed easily.  Psychiatric/Behavioral: Negative.      Objective:  BP (!) 160/88 (BP Location: Right Arm, Patient Position: Sitting, Cuff Size: Large)   Pulse 66   Temp 97.9 F (36.6 C) (Oral)   Ht '5\' 8"'$  (1.727 m)   Wt 158 lb (71.7 kg)   SpO2 97%   BMI 24.02 kg/m   BP Readings from Last 3 Encounters:  01/25/22 (!) 160/88  08/03/21 (!) 156/74  07/07/21 130/70    Wt Readings from Last 3 Encounters:  01/25/22 158 lb (71.7 kg)  08/03/21 153 lb (69.4 kg)  07/07/21 151 lb 9.6 oz (68.8 kg)    Physical Exam Vitals reviewed.  HENT:     Nose: Nose normal.     Mouth/Throat:     Mouth: Mucous membranes are moist.  Eyes:     General: No scleral icterus.    Conjunctiva/sclera: Conjunctivae normal.  Cardiovascular:     Rate and Rhythm: Bradycardia present. Occasional Extrasystoles are present.    Heart sounds:  No murmur heard.    No gallop.     Comments: EKG- SB with SA and frequent PVC's No Q waves or LVH Pulmonary:     Effort: Pulmonary effort is normal. No tachypnea or respiratory distress.     Breath sounds: Examination of the right-lower field reveals rales. Examination of the left-lower field reveals rales. Rales present. No decreased breath sounds, wheezing or rhonchi.  Abdominal:     Palpations: There is no mass.     Tenderness: There is no abdominal tenderness. There is no guarding.     Hernia: No hernia is present.  Musculoskeletal:     Cervical back: Neck supple.     Right lower leg: No edema.     Left lower leg: No edema.  Skin:    General: Skin is warm and dry.      Coloration: Skin is not pale.  Neurological:     General: No focal deficit present.     Mental Status: He is alert. Mental status is at baseline.  Psychiatric:        Mood and Affect: Mood normal.        Behavior: Behavior normal.     Lab Results  Component Value Date   WBC 7.1 01/25/2022   HGB 12.7 (L) 01/25/2022   HCT 36.9 (L) 01/25/2022   PLT 185.0 01/25/2022   GLUCOSE 89 01/25/2022   CHOL 115 04/12/2021   TRIG 68 04/12/2021   HDL 49 04/12/2021   LDLCALC 52 04/12/2021   ALT 14 01/25/2022   AST 28 01/25/2022   NA 138 01/25/2022   K 3.8 01/25/2022   CL 101 01/25/2022   CREATININE 1.22 01/25/2022   BUN 13 01/25/2022   CO2 23 01/25/2022   TSH 1.78 08/03/2021   PSA 0.00 (L) 08/09/2020    CT ABDOMEN PELVIS W CONTRAST  Result Date: 04/10/2021 CLINICAL DATA:  Abdominal pain, acute, nonlocalized. History of pancreatitis. EXAM: CT ABDOMEN AND PELVIS WITH CONTRAST TECHNIQUE: Multidetector CT imaging of the abdomen and pelvis was performed using the standard protocol following bolus administration of intravenous contrast. RADIATION DOSE REDUCTION: This exam was performed according to the departmental dose-optimization program which includes automated exposure control, adjustment of the mA and/or kV according to patient size and/or use of iterative reconstruction technique. CONTRAST:  168m OMNIPAQUE IOHEXOL 300 MG/ML  SOLN COMPARISON:  06/26/2020 FINDINGS: Lower chest: Chronic fibrotic and emphysematous change at the lung bases, right worse than left. No evidence of acute consolidation or pleural effusion. Hepatobiliary: Liver parenchyma is normal.  No calcified gallstones. Pancreas: Acute inflammatory change of the body and head of the pancreas with surrounding edema. No evidence of pseudocyst or abscess. Spleen: Normal Adrenals/Urinary Tract: Adrenal glands are normal. Mild atrophic change of the right kidney. Several small calcifications which could be vascular or small  nonobstructing stones. The left kidney also shows scattered calcifications that could be vascular or small stones. No hydronephrosis on either side. The bladder is normal. Stomach/Bowel: Stomach and small intestine are normal. Normal appendix. No colon pathology. Vascular/Lymphatic: Aortic atherosclerosis, pronounced. No aneurysm. IVC is normal. No adenopathy. Reproductive: Normal Other: No free fluid or air. Musculoskeletal: Chronic lumbar degenerative changes. IMPRESSION: Acute pancreatitis of the head and proximal body. No pseudocyst or abscess. Chronic emphysematous and fibrotic changes at the lung bases. Aortic atherosclerosis, advanced. Small nonobstructing renal calculi and/or vascular calcifications. Electronically Signed   By: MNelson ChimesM.D.   On: 04/10/2021 14:43   DG Chest 2 View  Result Date:  01/25/2022 CLINICAL DATA:  Cough for 2 months.  Smoker. EXAM: CHEST - 2 VIEW COMPARISON:  Radiographs 12/25/2015.  Abdominal CT 04/10/2021. FINDINGS: The heart size and mediastinal contours are stable. There are chronic fibrotic changes in both lungs which have progressed from the prior chest radiographs. No superimposed airspace disease, pleural effusion or pneumothorax identified. The bones appear unchanged, without acute findings. IMPRESSION: 1. No acute cardiopulmonary process identified. 2. Progressive chronic fibrotic lung disease which could be symptomatic. Consider follow-up high-resolution chest CT for further evaluation. Electronically Signed   By: Richardean Sale M.D.   On: 01/25/2022 11:28     Assessment & Plan:   Scott Lindsey was seen today for hypertension and cough.  Diagnoses and all orders for this visit:  Essential hypertension- His blood pressure is not adequately well-controlled.  Will increase the dose of hydralazine. -     CBC with Differential/Platelet; Future -     Basic metabolic panel; Future -     EKG 12-Lead -     Basic metabolic panel -     CBC with Differential/Platelet -      hydrALAZINE (APRESOLINE) 50 MG tablet; Take 1 tablet (50 mg total) by mouth 3 (three) times daily.  Thiamine deficiency -     CBC with Differential/Platelet; Future -     thiamine (VITAMIN B1) 100 MG tablet; Take 1 tablet (100 mg total) by mouth daily. -     CBC with Differential/Platelet  Dietary folate deficiency anemia -     CBC with Differential/Platelet; Future -     folic acid (FOLVITE) 1 MG tablet; Take 1 tablet (1 mg total) by mouth daily. -     CBC with Differential/Platelet  Elevated total protein- He is anemic and the total protein has risen slightly. -     Hepatic function panel; Future -     CBC with Differential/Platelet; Future -     Basic metabolic panel; Future -     Basic metabolic panel -     CBC with Differential/Platelet -     Hepatic function panel -     Ambulatory referral to Hematology / Oncology  Chronic cough -     DG Chest 2 View; Future -     CT Chest High Resolution; Future  Simple chronic bronchitis ([place]) - Will start a LAMA. -     CT Chest High Resolution; Future -     revefenacin (YUPELRI) 175 MCG/3ML nebulizer solution; Take 3 mLs (175 mcg total) by nebulization daily. -     Nebulizer MISC; 1 Act by Does not apply route daily.  Tobacco abuse disorder -     Cancel: Ambulatory Referral for Lung Cancer Scre  Flu vaccine need -     Flu Vaccine QUAD High Dose(Fluad)  Symptomatic PVCs -     Ambulatory referral to Cardiology  Thrombocytopenia ([place]) -     Zinc; Future -     Vitamin B12; Future -     Vitamin B12 -     Zinc  Bradycardia -     Ambulatory referral to Cardiology  Pulmonary fibrosis ([place]) -     CT Chest High Resolution; Future  Other orders -     Cancel: Flu Vaccine QUAD 6+ mos PF IM (Fluarix Quad PF)   I have discontinued Garen Lah. Scott Lindsey's hydrALAZINE. I have also changed his thiamine and folic acid. Additionally, I am having him start on hydrALAZINE, revefenacin, and Nebulizer. Lastly, I am having him maintain his  magnesium  oxide, multivitamin with minerals, nicotine, cloNIDine, losartan, atorvastatin, metoprolol succinate, and magnesium oxide.  Meds ordered this encounter  Medications   thiamine (VITAMIN B1) 100 MG tablet    Sig: Take 1 tablet (100 mg total) by mouth daily.    Dispense:  90 tablet    Refill:  1   folic acid (FOLVITE) 1 MG tablet    Sig: Take 1 tablet (1 mg total) by mouth daily.    Dispense:  90 tablet    Refill:  1   hydrALAZINE (APRESOLINE) 50 MG tablet    Sig: Take 1 tablet (50 mg total) by mouth 3 (three) times daily.    Dispense:  270 tablet    Refill:  0   revefenacin (YUPELRI) 175 MCG/3ML nebulizer solution    Sig: Take 3 mLs (175 mcg total) by nebulization daily.    Dispense:  90 mL    Refill:  1   Nebulizer MISC    Sig: 1 Act by Does not apply route daily.    Dispense:  1 each    Refill:  1     Follow-up: Return in about 4 months (around 05/26/2022).  Scarlette Calico, MD

## 2022-01-25 NOTE — Patient Instructions (Signed)
Hypertension, Adult High blood pressure (hypertension) is when the force of blood pumping through the arteries is too strong. The arteries are the blood vessels that carry blood from the heart throughout the body. Hypertension forces the heart to work harder to pump blood and may cause arteries to become narrow or stiff. Untreated or uncontrolled hypertension can lead to a heart attack, heart failure, a stroke, kidney disease, and other problems. A blood pressure reading consists of a higher number over a lower number. Ideally, your blood pressure should be below 120/80. The first ("top") number is called the systolic pressure. It is a measure of the pressure in your arteries as your heart beats. The second ("bottom") number is called the diastolic pressure. It is a measure of the pressure in your arteries as the heart relaxes. What are the causes? The exact cause of this condition is not known. There are some conditions that result in high blood pressure. What increases the risk? Certain factors may make you more likely to develop high blood pressure. Some of these risk factors are under your control, including: Smoking. Not getting enough exercise or physical activity. Being overweight. Having too much fat, sugar, calories, or salt (sodium) in your diet. Drinking too much alcohol. Other risk factors include: Having a personal history of heart disease, diabetes, high cholesterol, or kidney disease. Stress. Having a family history of high blood pressure and high cholesterol. Having obstructive sleep apnea. Age. The risk increases with age. What are the signs or symptoms? High blood pressure may not cause symptoms. Very high blood pressure (hypertensive crisis) may cause: Headache. Fast or irregular heartbeats (palpitations). Shortness of breath. Nosebleed. Nausea and vomiting. Vision changes. Severe chest pain, dizziness, and seizures. How is this diagnosed? This condition is diagnosed by  measuring your blood pressure while you are seated, with your arm resting on a flat surface, your legs uncrossed, and your feet flat on the floor. The cuff of the blood pressure monitor will be placed directly against the skin of your upper arm at the level of your heart. Blood pressure should be measured at least twice using the same arm. Certain conditions can cause a difference in blood pressure between your right and left arms. If you have a high blood pressure reading during one visit or you have normal blood pressure with other risk factors, you may be asked to: Return on a different day to have your blood pressure checked again. Monitor your blood pressure at home for 1 week or longer. If you are diagnosed with hypertension, you may have other blood or imaging tests to help your health care provider understand your overall risk for other conditions. How is this treated? This condition is treated by making healthy lifestyle changes, such as eating healthy foods, exercising more, and reducing your alcohol intake. You may be referred for counseling on a healthy diet and physical activity. Your health care provider may prescribe medicine if lifestyle changes are not enough to get your blood pressure under control and if: Your systolic blood pressure is above 130. Your diastolic blood pressure is above 80. Your personal target blood pressure may vary depending on your medical conditions, your age, and other factors. Follow these instructions at home: Eating and drinking  Eat a diet that is high in fiber and potassium, and low in sodium, added sugar, and fat. An example of this eating plan is called the DASH diet. DASH stands for Dietary Approaches to Stop Hypertension. To eat this way: Eat   plenty of fresh fruits and vegetables. Try to fill one half of your plate at each meal with fruits and vegetables. Eat whole grains, such as whole-wheat pasta, brown rice, or whole-grain bread. Fill about one  fourth of your plate with whole grains. Eat or drink low-fat dairy products, such as skim milk or low-fat yogurt. Avoid fatty cuts of meat, processed or cured meats, and poultry with skin. Fill about one fourth of your plate with lean proteins, such as fish, chicken without skin, beans, eggs, or tofu. Avoid pre-made and processed foods. These tend to be higher in sodium, added sugar, and fat. Reduce your daily sodium intake. Many people with hypertension should eat less than 1,500 mg of sodium a day. Do not drink alcohol if: Your health care provider tells you not to drink. You are pregnant, may be pregnant, or are planning to become pregnant. If you drink alcohol: Limit how much you have to: 0-1 drink a day for women. 0-2 drinks a day for men. Know how much alcohol is in your drink. In the [place] one drink equals one 12 oz bottle of beer (355 mL), one 5 oz glass of wine (148 mL), or one 1 oz glass of hard liquor (44 mL). Lifestyle  Work with your health care provider to maintain a healthy body weight or to lose weight. Ask what an ideal weight is for you. Get at least 30 minutes of exercise that causes your heart to beat faster (aerobic exercise) most days of the week. Activities may include walking, swimming, or biking. Include exercise to strengthen your muscles (resistance exercise), such as Pilates or lifting weights, as part of your weekly exercise routine. Try to do these types of exercises for 30 minutes at least 3 days a week. Do not use any products that contain nicotine or tobacco. These products include cigarettes, chewing tobacco, and vaping devices, such as e-cigarettes. If you need help quitting, ask your health care provider. Monitor your blood pressure at home as told by your health care provider. Keep all follow-up visits. This is important. Medicines Take over-the-counter and prescription medicines only as told by your health care provider. Follow directions carefully. Blood  pressure medicines must be taken as prescribed. Do not skip doses of blood pressure medicine. Doing this puts you at risk for problems and can make the medicine less effective. Ask your health care provider about side effects or reactions to medicines that you should watch for. Contact a health care provider if you: Think you are having a reaction to a medicine you are taking. Have headaches that keep coming back (recurring). Feel dizzy. Have swelling in your ankles. Have trouble with your vision. Get help right away if you: Develop a severe headache or confusion. Have unusual weakness or numbness. Feel faint. Have severe pain in your chest or abdomen. Vomit repeatedly. Have trouble breathing. These symptoms may be an emergency. Get help right away. Call 911. Do not wait to see if the symptoms will go away. Do not drive yourself to the hospital. Summary Hypertension is when the force of blood pumping through your arteries is too strong. If this condition is not controlled, it may put you at risk for serious complications. Your personal target blood pressure may vary depending on your medical conditions, your age, and other factors. For most people, a normal blood pressure is less than 120/80. Hypertension is treated with lifestyle changes, medicines, or a combination of both. Lifestyle changes include losing weight, eating a healthy,   low-sodium diet, exercising more, and limiting alcohol. This information is not intended to replace advice given to you by your health care provider. Make sure you discuss any questions you have with your health care provider. Document Revised: 11/01/2020 Document Reviewed: 11/01/2020 Elsevier Patient Education  2023 Elsevier Inc.  

## 2022-01-27 LAB — ZINC: Zinc: 69 ug/dL (ref 60–130)

## 2022-01-31 MED ORDER — NEBULIZER MISC: 1.0000 | Freq: Every day | 1 refills | Status: DC

## 2022-01-31 MED ORDER — REVEFENACIN 175 MCG/3ML IN SOLN: 175.0000 ug | Freq: Every day | RESPIRATORY_TRACT | 1 refills | Status: DC

## 2022-02-02 ENCOUNTER — Telehealth: Payer: Self-pay | Admitting: Internal Medicine

## 2022-02-02 NOTE — Telephone Encounter (Signed)
Gerhard Perches from [employer] called to request most recent OV notes for the pt to approve most recnet RX's.   Please fax to:  Fax: [phone] Phone: [phone]

## 2022-02-02 NOTE — Telephone Encounter (Signed)
01/25/22 OV note has been printed and faxed.

## 2022-02-15 ENCOUNTER — Telehealth: Payer: Self-pay

## 2022-02-15 NOTE — Telephone Encounter (Signed)
Left message for patient to call back to schedule Medicare Annual Wellness Visit.  No hx of AWV eligible as of 09/09/14  Please schedule at anytime with [facility] if patient calls the office back.    30 minute appointment.  Any questions, please call me at [phone]

## 2022-02-26 ENCOUNTER — Other Ambulatory Visit: Payer: Self-pay | Admitting: Adult Health

## 2022-02-26 DIAGNOSIS — I10 Essential (primary) hypertension: Secondary | ICD-10-CM

## 2022-03-07 NOTE — Progress Notes (Signed)
Cardiology Clinic Note   Patient Name: Scott Lindsey Date of Encounter: 03/09/2022  Primary Care Provider:  [name], MD Primary Cardiologist:  [name], MD  Patient Profile    67 year old male with history of hyperlipidemia, hypertension,palpitations with frequent PVCs, history of prostate cancer, alcohol abuse and ongoing tobacco abuse.  He is on lithium.  Zio patch 04/30/2019 revealing 10.4% PVCs which was suspected to be related to hypomagnesemia and alcohol abuse.   Was seen by PCP on 01/25/2022 for complaints of cough and congestion and was found to be hypertensive 168/88, and hydralazine dose was increased.  Past Medical History    Past Medical History:  Diagnosis Date   Back pain    Past Surgical History:  Procedure Laterality Date   MULTIPLE TOOTH EXTRACTIONS     ROBOT ASSISTED LAPAROSCOPIC RADICAL PROSTATECTOMY  12/13/2010   Procedure: ROBOTIC ASSISTED LAPAROSCOPIC RADICAL PROSTATECTOMY;  Surgeon: Bernestine Amass, MD;  Location: [facility];  Service: Urology;  Laterality: N/A;  with Bilateral Pelivic Lymph Node Dissection    Allergies  No Known Allergies  History of Present Illness    Mr. Scott Lindsey presents today for ongoing assessment and management of hyperlipidemia, hypertension,palpitations with frequent PVC's. Was seen by PCP on 01/25/2022 for complaints of cough and congestion and was found to be hypertensive 168/88, and hydralazine dose was increased.  Mr. Scott Lindsey is hypertensive this office visit.  He has not yet taken his morning meds.  He also has smoked within the last few hours prior to coming to the office.  He continues to drink alcohol, but he states that it is only a couple of sips of wine each day.  Home Medications    Current Outpatient Medications  Medication Sig Dispense Refill   atorvastatin (LIPITOR) 20 MG tablet Take 1 tablet (20 mg total) by mouth daily. 90 tablet 3   cloNIDine (CATAPRES) 0.1 MG tablet TAKE 1 TABLET BY MOUTH THREE TIMES  DAILY [id number] tablet 0   folic acid (FOLVITE) 1 MG tablet Take 1 tablet (1 mg total) by mouth daily. 90 tablet 1   hydrALAZINE (APRESOLINE) 25 MG tablet Take 1 tablet (25 mg total) by mouth 3 (three) times daily. 270 tablet 3   losartan (COZAAR) 100 MG tablet TAKE 1 TABLET BY MOUTH ONCE DAILY . APPOINTMENT REQUIRED FOR FUTURE REFILLS 90 tablet 3   magnesium oxide (MAG-OX) 400 (241.3 Mg) MG tablet Take 1 tablet (400 mg total) by mouth 2 (two) times daily. 180 tablet 3   metoprolol succinate (TOPROL-XL) 25 MG 24 hr tablet Take 1 tablet (25 mg total) by mouth daily. 90 tablet 3   Multiple Vitamin (MULTIVITAMIN WITH MINERALS) TABS tablet Take 1 tablet by mouth daily.     nicotine (NICODERM CQ - DOSED IN MG/24 HOURS) 14 mg/24hr patch Place 1 patch (14 mg total) onto the skin daily. 28 patch 0   revefenacin (YUPELRI) 175 MCG/3ML nebulizer solution Take 3 mLs (175 mcg total) by nebulization daily. 90 mL 1   thiamine (VITAMIN B1) 100 MG tablet Take 1 tablet (100 mg total) by mouth daily. 90 tablet 1   No current facility-administered medications for this visit.     Family History    Family History  Problem Relation Age of Onset   Prostate cancer Brother    He indicated that the status of his brother is unknown.  Social History    Social History   Socioeconomic History   Marital status: Married    Spouse  name: Not on file   Number of children: Not on file   Years of education: Not on file   Highest education level: Not on file  Occupational History   Not on file  Tobacco Use   Smoking status: Every Day    Packs/day: 0.50    Years: 25.00    Total pack years: 12.50    Types: Cigarettes   Smokeless tobacco: Never  Substance and Sexual Activity   Alcohol use: Not Currently    Alcohol/week: 10.0 standard drinks of alcohol    Types: 10 Glasses of wine per week    Comment: occasional   Drug use: No   Sexual activity: Yes    Partners: Female  Other Topics Concern   Not on file  Social  History Narrative   Not on file   Social Determinants of Health   Financial Resource Strain: Not on file  Food Insecurity: Not on file  Transportation Needs: Not on file  Physical Activity: Not on file  Stress: Not on file  Social Connections: Not on file  Intimate Partner Violence: Not on file     Review of Systems    General:  No chills, fever, night sweats or weight changes.  Cardiovascular:  No chest pain, dyspnea on exertion, edema, orthopnea, palpitations, paroxysmal nocturnal dyspnea. Dermatological: No rash, lesions/masses Respiratory: No cough, dyspnea Urologic: No hematuria, dysuria Abdominal:   No nausea, vomiting, diarrhea, bright red blood per rectum, melena, or hematemesis Neurologic:  No visual changes, wkns, changes in mental status. All other systems reviewed and are otherwise negative except as noted above.     Physical Exam    VS:  BP (!) 157/79 (BP Location: Left Arm, Patient Position: Sitting, Cuff Size: Normal) Comment: automatic machine  Pulse 64   Ht '5\' 8"'$  (1.727 m)   Wt 158 lb 3.2 oz (71.8 kg)   BMI 24.05 kg/m  , BMI Body mass index is 24.05 kg/m.     GEN: Well nourished, well developed, in no acute distress. HEENT: normal. Neck: Supple, no JVD, right carotid bruits, or masses. Cardiac: RRR, no murmurs, rubs, or gallops. No clubbing, cyanosis, edema.  Radials/DP/PT 2+ on the right, 1+ on the left\ Resp.  Diminished lung sounds in the bases without wheezes. GI: Soft, nontender, nondistended, BS + x 4. MS: no deformity or atrophy. Skin: warm and dry, no rash. Neuro:  Strength and sensation are intact. Psych: Normal affect.  Accessory Clinical Findings    ECG personally reviewed by me today-normal sinus rhythm heart rate 60 bpm- No acute changes  Lab Results  Component Value Date   WBC 7.1 01/25/2022   HGB 12.7 (L) 01/25/2022   HCT 36.9 (L) 01/25/2022   MCV 91.3 01/25/2022   PLT 185.0 01/25/2022   Lab Results  Component Value Date    CREATININE 1.22 01/25/2022   BUN 13 01/25/2022   NA 138 01/25/2022   K 3.8 01/25/2022   CL 101 01/25/2022   CO2 23 01/25/2022   Lab Results  Component Value Date   ALT 14 01/25/2022   AST 28 01/25/2022   ALKPHOS 158 (H) 01/25/2022   BILITOT 0.4 01/25/2022   Lab Results  Component Value Date   CHOL 115 04/12/2021   HDL 49 04/12/2021   LDLCALC 52 04/12/2021   TRIG 68 04/12/2021   CHOLHDL 2.3 04/12/2021    No results found for: "HGBA1C"  Review of Prior Studies:  Echocardiogram 04/28/2019  1. Left ventricular ejection fraction, by estimation,  is 55 to 60%. Left  ventricular ejection fraction by 3D volume is 58 %. The left ventricle has  normal function. The left ventricle has no regional wall motion  abnormalities. Left ventricular diastolic   parameters are consistent with Grade I diastolic dysfunction (impaired  relaxation).   2. Right ventricular systolic function is normal. The right ventricular  size is normal. There is normal pulmonary artery systolic pressure. The  estimated right ventricular systolic pressure is [id number] mmHg.   3. The mitral valve is grossly normal. Mild mitral valve regurgitation.  No evidence of mitral stenosis.   4. The aortic valve is tricuspid. Aortic valve regurgitation is not  visualized. No aortic stenosis is present.   5. The inferior vena cava is normal in size with greater than 50%  respiratory variability, suggesting right atrial pressure of 3 mmHg.   Zio Monitor 12/21/2020 Patient had a min HR of 55 bpm, max HR of 119 bpm, and avg HR of 74 bpm. Predominant underlying rhythm was Sinus Rhythm. Isolated SVEs were rare (<1.0%), SVE Couplets were rare (<1.0%), and no SVE Triplets were present. Isolated VEs were rare (<1.0%), VE  Couplets were rare (<1.0%), and no VE Triplets were present. Ventricular Bigeminy and Trigeminy were present.   Assessment & Plan   1.  Uncontrolled hypertension: Multiple etiology.  I reviewed all of his medications  with him and he is not taking hydralazine.  He states that he thought someone had taken him off of it.  He does not have it at home and a longer. I am restarting hydralazine 25 mg 3 times daily.  Prescription has been provided.  He is to take his blood pressure each day and bring back his blood pressure log in a month.  He has been educated on times of day to take the hydralazine 8 hours apart.  He is willing to do so.  2.  Polysubstance abuse: He continues to smoke and drink alcohol.  He minimizes the amount of use.  He does state he is trying to cut down.  And states he no longer drinks whiskey.  3.  Hyperlipidemia: Patient is on atorvastatin 20 mg daily.  Review of most recent labs has been well-controlled.  No changes.  Current medicines are reviewed at length with the patient today.  I have spent 25 min's  dedicated to the care of this patient on the date of this encounter to include pre-visit review of records, assessment, management and diagnostic testing,with shared decision making. Signed, Phill Myron. West Pugh, ANP, [place]   03/09/2022 10:34 AM      Office [phone] Fax [phone]  Notice: This dictation was prepared with Dragon dictation along with smaller phrase technology. Any transcriptional errors that result from this process are unintentional and may not be corrected upon review.

## 2022-03-09 ENCOUNTER — Encounter: Payer: Self-pay | Admitting: Adult Health

## 2022-03-09 ENCOUNTER — Ambulatory Visit: Payer: Medicare Other | Attending: Adult Health | Admitting: Adult Health

## 2022-03-09 VITALS — BP 157/79 | HR 64 | Ht 68.0 in | Wt 158.2 lb

## 2022-03-09 DIAGNOSIS — E785 Hyperlipidemia, unspecified: Secondary | ICD-10-CM | POA: Insufficient documentation

## 2022-03-09 DIAGNOSIS — F1910 Other psychoactive substance abuse, uncomplicated: Secondary | ICD-10-CM | POA: Diagnosis present

## 2022-03-09 DIAGNOSIS — I10 Essential (primary) hypertension: Secondary | ICD-10-CM | POA: Insufficient documentation

## 2022-03-09 MED ORDER — HYDRALAZINE HCL 25 MG PO TABS: 25.0000 mg | ORAL_TABLET | Freq: Three times a day (TID) | ORAL | 3 refills | Status: DC

## 2022-03-09 NOTE — Patient Instructions (Signed)
Medication Instructions:  Start Hydralazine 25 mg ( Take 1 Tablet 3 Times Daily). *If you need a refill on your cardiac medications before your next appointment, please call your pharmacy*   Lab Work: No Labs If you have labs (blood work) drawn today and your tests are completely normal, you will receive your results only by: [place] (if you have MyChart) OR A paper copy in the mail If you have any lab test that is abnormal or we need to change your treatment, we will call you to review the results.   Testing/Procedures: [place], [address]. Your physician has requested that you have a carotid duplex. This test is an ultrasound of the carotid arteries in your neck. It looks at blood flow through these arteries that supply the brain with blood. Allow one hour for this exam. There are no restrictions or special instructions.    Follow-Up: At [facility], you and your health needs are our priority.  As part of our continuing mission to provide you with exceptional heart care, we have created designated Provider Care Teams.  These Care Teams include your primary Cardiologist (physician) and Advanced Practice Providers (APPs -  Physician Assistants and Nurse Practitioners) who all work together to provide you with the care you need, when you need it.  We recommend signing up for the patient portal called "MyChart".  Sign up information is provided on this After Visit Summary.  MyChart is used to connect with patients for Virtual Visits (Telemedicine).  Patients are able to view lab/test results, encounter notes, upcoming appointments, etc.  Non-urgent messages can be sent to your provider as well.   To learn more about what you can do with MyChart, go to [email or web address].    Your next appointment:   1 month(s)  Provider:   Jory Sims, DNP, ANP       Other Instructions Take Hydralazine for example 10 am , 3 pm, 9 pm.

## 2022-03-15 ENCOUNTER — Ambulatory Visit (HOSPITAL_COMMUNITY)
Admission: RE | Admit: 2022-03-15 | Discharge: 2022-03-15 | Disposition: A | Discharge status: Home / Self Care | Payer: Medicare Other | Source: Ambulatory Visit | Attending: Adult Health | Admitting: Adult Health

## 2022-03-15 ENCOUNTER — Other Ambulatory Visit: Payer: Self-pay | Admitting: Adult Health

## 2022-03-15 DIAGNOSIS — F1910 Other psychoactive substance abuse, uncomplicated: Secondary | ICD-10-CM

## 2022-03-15 DIAGNOSIS — I10 Essential (primary) hypertension: Secondary | ICD-10-CM | POA: Insufficient documentation

## 2022-03-15 DIAGNOSIS — I6521 Occlusion and stenosis of right carotid artery: Secondary | ICD-10-CM

## 2022-03-15 DIAGNOSIS — I6529 Occlusion and stenosis of unspecified carotid artery: Secondary | ICD-10-CM | POA: Diagnosis not present

## 2022-03-15 DIAGNOSIS — E785 Hyperlipidemia, unspecified: Secondary | ICD-10-CM

## 2022-03-21 ENCOUNTER — Telehealth: Payer: Self-pay

## 2022-03-21 NOTE — Telephone Encounter (Addendum)
Called patietn regarding results. Unable to leave message for patient due to voicemail not set up----- Message from Scott Colonel, NP sent at 03/15/2022  1:58 PM EST ----- Reviewed ultrasound no blockages seen in the carotid arteries . Some diminished flow in the left subclavian artery located in the upper left chest . This is not concerning . Good report. No changes in treatment or medication.  KL

## 2022-04-05 ENCOUNTER — Telehealth: Payer: Self-pay

## 2022-04-05 NOTE — Telephone Encounter (Addendum)
Called patient regarding results. Unable to contact due voicemail not set up. Letter mailed 04/05/2022.----- Message from Lendon Colonel, NP sent at 03/15/2022  1:58 PM EST ----- Reviewed ultrasound no blockages seen in the carotid arteries . Some diminished flow in the left subclavian artery located in the upper left chest . This is not concerning . Good report. No changes in treatment or medication.  KL

## 2022-05-02 NOTE — Progress Notes (Signed)
Cardiology Clinic Note   Patient Name: Scott Lindsey Date of Encounter: 05/04/2022  Primary Care Provider:  Etta Grandchild, MD Primary Cardiologist:  Little Ishikawa, MD  Patient Profile    67 year old male with history of hyperlipidemia, hypertension,palpitations with frequent PVCs, history of prostate cancer, alcohol abuse and ongoing tobacco abuse.  He is on lithium.  Zio patch 04/30/2019 revealing 10.4% PVCs which was suspected to be related to hypomagnesemia and alcohol abuse.   Was seen by PCP on 01/25/2022 for complaints of cough and congestion and was found to be hypertensive 168/88, and hydralazine dose was increased.   Past Medical History    Past Medical History:  Diagnosis Date   Back pain    Past Surgical History:  Procedure Laterality Date   MULTIPLE TOOTH EXTRACTIONS     ROBOT ASSISTED LAPAROSCOPIC RADICAL PROSTATECTOMY  12/13/2010   Procedure: ROBOTIC ASSISTED LAPAROSCOPIC RADICAL PROSTATECTOMY;  Surgeon: Valetta Fuller, MD;  Location: [facility];  Service: Urology;  Laterality: N/A;  with Bilateral Pelivic Lymph Node Dissection    Allergies  No Known Allergies  History of Present Illness    Scott Lindsey returns today for reevaluation of blood pressure control after last office visit where he was confused on his medications and continued to be hypertensive.  He was restarted on hydralazine 25 mg 3 times daily as he had not been taking it.  He was to keep track of his blood pressure and bring recordings with him.  He was also counseled on tobacco cessation and had reported that he no longer drinks whiskey.  He reports that has not been taking the hydralazine because he states it "made me feel like a zombie".  He states he feels his HR staying irregular. He denies chest pain, DOE, or dizziness. He continues on clonidine 0.1 mg TID and metoprolol.   Home Medications    Current Outpatient Medications  Medication Sig Dispense Refill   atorvastatin (LIPITOR) 20 MG  tablet Take 1 tablet (20 mg total) by mouth daily. 90 tablet 3   cloNIDine (CATAPRES) 0.1 MG tablet TAKE 1 TABLET BY MOUTH THREE TIMES DAILY 270 tablet 0   folic acid (FOLVITE) 1 MG tablet Take 1 tablet (1 mg total) by mouth daily. 90 tablet 1   hydrALAZINE (APRESOLINE) 25 MG tablet Take 1 tablet (25 mg total) by mouth 3 (three) times daily. 270 tablet 3   losartan (COZAAR) 100 MG tablet TAKE 1 TABLET BY MOUTH ONCE DAILY . APPOINTMENT REQUIRED FOR FUTURE REFILLS 90 tablet 3   magnesium oxide (MAG-OX) 400 (241.3 Mg) MG tablet Take 1 tablet (400 mg total) by mouth 2 (two) times daily. 180 tablet 3   metoprolol succinate (TOPROL-XL) 25 MG 24 hr tablet Take 1 tablet (25 mg total) by mouth daily. 90 tablet 3   Multiple Vitamin (MULTIVITAMIN WITH MINERALS) TABS tablet Take 1 tablet by mouth daily.     thiamine (VITAMIN B1) 100 MG tablet Take 1 tablet (100 mg total) by mouth daily. 90 tablet 1   No current facility-administered medications for this visit.     Family History    Family History  Problem Relation Age of Onset   Prostate cancer Brother    He indicated that the status of his brother is unknown.  Social History    Social History   Socioeconomic History   Marital status: Married    Spouse name: Not on file   Number of children: Not on file   Years of  education: Not on file   Highest education level: Not on file  Occupational History   Not on file  Tobacco Use   Smoking status: Every Day    Packs/day: 0.50    Years: 25.00    Additional pack years: 0.00    Total pack years: 12.50    Types: Cigarettes   Smokeless tobacco: Never  Substance and Sexual Activity   Alcohol use: Not Currently    Alcohol/week: 10.0 standard drinks of alcohol    Types: 10 Glasses of wine per week    Comment: occasional   Drug use: No   Sexual activity: Yes    Partners: Female  Other Topics Concern   Not on file  Social History Narrative   Not on file   Social Determinants of Health    Financial Resource Strain: Not on file  Food Insecurity: Not on file  Transportation Needs: Not on file  Physical Activity: Not on file  Stress: Not on file  Social Connections: Not on file  Intimate Partner Violence: Not on file     Review of Systems    General:  No chills, fever, night sweats or weight changes.  Cardiovascular:  No chest pain, dyspnea on exertion, edema, orthopnea, palpitations, paroxysmal nocturnal dyspnea. Dermatological: No rash, lesions/masses Respiratory: No cough, dyspnea Urologic: No hematuria, dysuria Abdominal:   No nausea, vomiting, diarrhea, bright red blood per rectum, melena, or hematemesis Neurologic:  No visual changes, wkns, changes in mental status. All other systems reviewed and are otherwise negative except as noted above.     Physical Exam    VS:  BP 136/88   Ht 5\' 8"  (1.727 m)   Wt 156 lb (70.8 kg)   BMI 23.72 kg/m  , BMI Body mass index is 23.72 kg/m.     GEN: Well nourished, well developed, in no acute distress. HEENT: normal. Neck: Supple, no JVD, carotid bruits, or masses. Cardiac: IRRR, no murmurs, rubs, or gallops. No clubbing, cyanosis, edema.  Radials/DP/PT 2+ and equal bilaterally.  Respiratory:  Respirations regular and unlabored, some bibasilar crackles. No wheezes.  GI: Soft, nontender, nondistended, BS + x 4. MS: no deformity or atrophy. Skin: warm and dry, no rash. Neuro:  Strength and sensation are intact. Psych: Normal affect.  Accessory Clinical Findings    ECG personally reviewed by me today-sinus rhythm with sinus arrhythmia, frequent PVCs.  Heart rate of 60 bpm.  Lab Results  Component Value Date   WBC 7.1 01/25/2022   HGB 12.7 (L) 01/25/2022   HCT 36.9 (L) 01/25/2022   MCV 91.3 01/25/2022   PLT 185.0 01/25/2022   Lab Results  Component Value Date   CREATININE 1.22 01/25/2022   BUN 13 01/25/2022   NA 138 01/25/2022   K 3.8 01/25/2022   CL 101 01/25/2022   CO2 23 01/25/2022   Lab Results   Component Value Date   ALT 14 01/25/2022   AST 28 01/25/2022   ALKPHOS 158 (H) 01/25/2022   BILITOT 0.4 01/25/2022   Lab Results  Component Value Date   CHOL 115 04/12/2021   HDL 49 04/12/2021   LDLCALC 52 04/12/2021   TRIG 68 04/12/2021   CHOLHDL 2.3 04/12/2021    No results found for: "HGBA1C"  Review of Prior Studies: Echocardiogram 04/28/2019  1. Left ventricular ejection fraction, by estimation, is 55 to 60%. Left  ventricular ejection fraction by 3D volume is 58 %. The left ventricle has  normal function. The left ventricle has no regional  wall motion  abnormalities. Left ventricular diastolic   parameters are consistent with Grade I diastolic dysfunction (impaired  relaxation).   2. Right ventricular systolic function is normal. The right ventricular  size is normal. There is normal pulmonary artery systolic pressure. The  estimated right ventricular systolic pressure is 28.2 mmHg.   3. The mitral valve is grossly normal. Mild mitral valve regurgitation.  No evidence of mitral stenosis.   4. The aortic valve is tricuspid. Aortic valve regurgitation is not  visualized. No aortic stenosis is present.   5. The inferior vena cava is normal in size with greater than 50%  respiratory variability, suggesting right atrial pressure of 3 mmHg.    Zio Monitor 12/21/2020 Patient had a min HR of 55 bpm, max HR of 119 bpm, and avg HR of 74 bpm. Predominant underlying rhythm was Sinus Rhythm. Isolated SVEs were rare (<1.0%), SVE Couplets were rare (<1.0%), and no SVE Triplets were present. Isolated VEs were rare (<1.0%), VE  Couplets were rare (<1.0%), and no VE Triplets were present. Ventricular Bigeminy and Trigeminy were present.   Assessment & Plan   1.  Hypertension: Patient is intolerant to hydralazine.  He was prescribed that by PCP.  The patient has not been taking it but has been taking clonidine 0.1 mg 3 times daily as directed.  Blood pressure is well-controlled currently.   He brings with him his list of blood pressure recordings and they average between 118/69 to 141/73, with a average blood pressure of 135/77.  I will continue current medication regimen as he is tolerating this.   2.  Hypercholesterolemia: Continues on atorvastatin 20 mg daily.  Will need follow-up lipids and LFTs if not completed by PCP on next appointment.  3.  Ongoing tobacco abuse: Smoking cessation is again discussed with the patient.  He continues to work on cutting back he is down to 10 cigarettes a day from a pack and a half a day.  Signed, Bettey Mare. Liborio Nixon, ANP, AACC   05/04/2022 1:48 PM      Office [phone] Fax [phone]  Notice: This dictation was prepared with Dragon dictation along with smaller phrase technology. Any transcriptional errors that result from this process are unintentional and may not be corrected upon review.

## 2022-05-04 ENCOUNTER — Encounter: Payer: Self-pay | Admitting: Adult Health

## 2022-05-04 ENCOUNTER — Ambulatory Visit: Payer: Medicare Other | Attending: Adult Health | Admitting: Adult Health

## 2022-05-04 VITALS — BP 136/88 | Ht 68.0 in | Wt 156.0 lb

## 2022-05-04 DIAGNOSIS — I10 Essential (primary) hypertension: Secondary | ICD-10-CM | POA: Insufficient documentation

## 2022-05-04 DIAGNOSIS — I499 Cardiac arrhythmia, unspecified: Secondary | ICD-10-CM | POA: Diagnosis not present

## 2022-05-04 DIAGNOSIS — E7800 Pure hypercholesterolemia, unspecified: Secondary | ICD-10-CM | POA: Insufficient documentation

## 2022-05-04 NOTE — Patient Instructions (Signed)
Medication Instructions:  No Changes *If you need a refill on your cardiac medications before your next appointment, please call your pharmacy*   Lab Work: No labs If you have labs (blood work) drawn today and your tests are completely normal, you will receive your results only by: MyChart Message (if you have MyChart) OR A paper copy in the mail If you have any lab test that is abnormal or we need to change your treatment, we will call you to review the results.   Testing/Procedures: No Testing   Follow-Up: At [facility], you and your health needs are our priority.  As part of our continuing mission to provide you with exceptional heart care, we have created designated Provider Care Teams.  These Care Teams include your primary Cardiologist (physician) and Advanced Practice Providers (APPs -  Physician Assistants and Nurse Practitioners) who all work together to provide you with the care you need, when you need it.  We recommend signing up for the patient portal called "MyChart".  Sign up information is provided on this After Visit Summary.  MyChart is used to connect with patients for Virtual Visits (Telemedicine).  Patients are able to view lab/test results, encounter notes, upcoming appointments, etc.  Non-urgent messages can be sent to your provider as well.   To learn more about what you can do with MyChart, go to [email or web address].    Your next appointment:   6 month(s)  Provider:   Little Ishikawa, MD

## 2022-07-17 ENCOUNTER — Other Ambulatory Visit: Payer: Self-pay | Admitting: Adult Health

## 2022-07-17 DIAGNOSIS — I10 Essential (primary) hypertension: Secondary | ICD-10-CM

## 2022-07-31 ENCOUNTER — Ambulatory Visit: Payer: Medicare Other

## 2022-08-02 ENCOUNTER — Encounter: Payer: Self-pay | Admitting: Internal Medicine

## 2022-08-02 ENCOUNTER — Ambulatory Visit (INDEPENDENT_AMBULATORY_CARE_PROVIDER_SITE_OTHER): Payer: Medicare Other

## 2022-08-02 ENCOUNTER — Ambulatory Visit (INDEPENDENT_AMBULATORY_CARE_PROVIDER_SITE_OTHER): Payer: Medicare Other | Admitting: Internal Medicine

## 2022-08-02 VITALS — BP 134/68 | HR 76 | Temp 98.0°F | Ht 68.0 in | Wt 144.0 lb

## 2022-08-02 DIAGNOSIS — E785 Hyperlipidemia, unspecified: Secondary | ICD-10-CM

## 2022-08-02 DIAGNOSIS — R052 Subacute cough: Secondary | ICD-10-CM | POA: Diagnosis not present

## 2022-08-02 DIAGNOSIS — R059 Cough, unspecified: Secondary | ICD-10-CM | POA: Diagnosis not present

## 2022-08-02 DIAGNOSIS — R634 Abnormal weight loss: Secondary | ICD-10-CM | POA: Diagnosis not present

## 2022-08-02 DIAGNOSIS — I10 Essential (primary) hypertension: Secondary | ICD-10-CM | POA: Diagnosis not present

## 2022-08-02 LAB — CBC WITH DIFFERENTIAL/PLATELET
Basophils Absolute: 0 10*3/uL (ref 0.0–0.1)
Basophils Relative: 0.3 % (ref 0.0–3.0)
Eosinophils Absolute: 0.1 10*3/uL (ref 0.0–0.7)
Eosinophils Relative: 2 % (ref 0.0–5.0)
HCT: 40 % (ref 39.0–52.0)
Hemoglobin: 13 g/dL (ref 13.0–17.0)
Lymphocytes Relative: 30.9 % (ref 12.0–46.0)
Lymphs Abs: 2.3 10*3/uL (ref 0.7–4.0)
MCHC: 32.4 g/dL (ref 30.0–36.0)
MCV: 94.6 fl (ref 78.0–100.0)
Monocytes Absolute: 0.9 10*3/uL (ref 0.1–1.0)
Monocytes Relative: 12.1 % — ABNORMAL HIGH (ref 3.0–12.0)
Neutro Abs: 4 10*3/uL (ref 1.4–7.7)
Neutrophils Relative %: 54.7 % (ref 43.0–77.0)
Platelets: 203 10*3/uL (ref 150.0–400.0)
RBC: 4.23 Mil/uL (ref 4.22–5.81)
RDW: 15.8 % — ABNORMAL HIGH (ref 11.5–15.5)
WBC: 7.3 10*3/uL (ref 4.0–10.5)

## 2022-08-02 LAB — HEPATIC FUNCTION PANEL
ALT: 11 U/L (ref 0–53)
AST: 20 U/L (ref 0–37)
Albumin: 4 g/dL (ref 3.5–5.2)
Alkaline Phosphatase: 160 U/L — ABNORMAL HIGH (ref 39–117)
Bilirubin, Direct: 0.1 mg/dL (ref 0.0–0.3)
Total Bilirubin: 0.5 mg/dL (ref 0.2–1.2)
Total Protein: 8.6 g/dL — ABNORMAL HIGH (ref 6.0–8.3)

## 2022-08-02 LAB — LIPID PANEL
Cholesterol: 113 mg/dL (ref 0–200)
HDL: 45.9 mg/dL (ref >39.00)
LDL Cholesterol: 51 mg/dL (ref 0–99)
NonHDL: 67.3
Total CHOL/HDL Ratio: 2
Triglycerides: 81 mg/dL (ref 0.0–149.0)
VLDL: 16.2 mg/dL (ref 0.0–40.0)

## 2022-08-02 LAB — BASIC METABOLIC PANEL
BUN: 12 mg/dL (ref 6–23)
CO2: 28 mEq/L (ref 19–32)
Calcium: 10.1 mg/dL (ref 8.4–10.5)
Chloride: 101 mEq/L (ref 96–112)
Creatinine, Ser: 1.1 mg/dL (ref 0.40–1.50)
GFR: 68.65 mL/min (ref >60.00)
Glucose, Bld: 109 mg/dL — ABNORMAL HIGH (ref 70–99)
Potassium: 4.9 mEq/L (ref 3.5–5.1)
Sodium: 136 mEq/L (ref 135–145)

## 2022-08-02 LAB — TSH: TSH: 1.58 u[IU]/mL (ref 0.35–5.50)

## 2022-08-02 NOTE — Progress Notes (Signed)
Subjective:  Patient ID: Scott Lindsey, male    DOB: 12-Jul-1955  Age: 67 y.o. MRN: [id number]  CC: Hypertension and Hyperlipidemia   HPI Scott Lindsey presents for f/up -----  Discussed the use of AI scribe software for clinical note transcription with the patient, who gave verbal consent to proceed.  History of Present Illness   The patient, with a history of cardiac disease, presents with a persistent cough. They report that the cough was initially constant but has since improved after reducing their smoking habit. The cough is productive, with clear phlegm, but there is no hemoptysis. They deny any associated wheezing or dyspnea and do not use any inhalers.   In addition to smoking, the patient admits to consuming alcohol, specifically vodka, at a rate of approximately one pint per week. They deny any associated symptoms such as nervousness or anxiety. The patient has noticed a fluctuation in their weight, with a recent loss of about eight pounds, which they attribute to dietary monotony.       Outpatient Medications Prior to Visit  Medication Sig Dispense Refill   atorvastatin (LIPITOR) 20 MG tablet Take 1 tablet (20 mg total) by mouth daily. 90 tablet 3   cloNIDine (CATAPRES) 0.1 MG tablet TAKE 1 TABLET BY MOUTH THREE TIMES DAILY 270 tablet 3   folic acid (FOLVITE) 1 MG tablet Take 1 tablet (1 mg total) by mouth daily. 90 tablet 1   losartan (COZAAR) 100 MG tablet TAKE 1 TABLET BY MOUTH ONCE DAILY . APPOINTMENT REQUIRED FOR FUTURE REFILLS 90 tablet 3   magnesium oxide (MAG-OX) 400 (241.3 Mg) MG tablet Take 1 tablet (400 mg total) by mouth 2 (two) times daily. 180 tablet 3   metoprolol succinate (TOPROL-XL) 25 MG 24 hr tablet Take 1 tablet by mouth once daily 30 tablet 11   Multiple Vitamin (MULTIVITAMIN WITH MINERALS) TABS tablet Take 1 tablet by mouth daily.     thiamine (VITAMIN B1) 100 MG tablet Take 1 tablet (100 mg total) by mouth daily. 90 tablet 1   No facility-administered  medications prior to visit.    ROS Review of Systems  Constitutional:  Positive for unexpected weight change. Negative for appetite change, chills, diaphoresis and fatigue.  HENT: Negative.  Negative for trouble swallowing.   Eyes: Negative.   Respiratory:  Positive for cough. Negative for chest tightness, shortness of breath and wheezing.   Cardiovascular:  Negative for chest pain, palpitations and leg swelling.  Gastrointestinal: Negative.  Negative for abdominal pain, constipation, diarrhea, nausea and vomiting.  Endocrine: Negative.   Genitourinary: Negative.  Negative for difficulty urinating and dysuria.  Musculoskeletal: Negative.  Negative for arthralgias and myalgias.  Skin: Negative.   Neurological: Negative.  Negative for dizziness, weakness and headaches.  Hematological:  Negative for adenopathy. Does not bruise/bleed easily.  Psychiatric/Behavioral: Negative.      Objective:  BP 134/68 (BP Location: Right Arm, Patient Position: Sitting, Cuff Size: Large)   Pulse 76   Temp 98 F (36.7 C) (Oral)   Ht 5\' 8"  (1.727 m)   Wt 144 lb (65.3 kg)   SpO2 95%   BMI 21.90 kg/m   BP Readings from Last 3 Encounters:  08/02/22 134/68  05/04/22 136/88  03/09/22 (!) 157/79    Wt Readings from Last 3 Encounters:  08/02/22 144 lb (65.3 kg)  05/04/22 156 lb (70.8 kg)  03/09/22 158 lb 3.2 oz (71.8 kg)    Physical Exam Vitals reviewed.  Constitutional:  General: He is not in acute distress.    Appearance: He is ill-appearing. He is not toxic-appearing or diaphoretic.  HENT:     Mouth/Throat:     Mouth: Mucous membranes are moist.  Eyes:     General: No scleral icterus.    Conjunctiva/sclera: Conjunctivae normal.  Cardiovascular:     Rate and Rhythm: Normal rate and regular rhythm.     Heart sounds: No murmur heard. Pulmonary:     Effort: Pulmonary effort is normal.     Breath sounds: Examination of the right-lower field reveals rales. Examination of the left-lower  field reveals rales. Rales present. No decreased breath sounds, wheezing or rhonchi.  Abdominal:     General: Abdomen is flat.     Palpations: There is no mass.     Tenderness: There is no abdominal tenderness. There is no guarding.     Hernia: No hernia is present.  Musculoskeletal:        General: Normal range of motion.     Cervical back: Neck supple.     Right lower leg: No edema.     Left lower leg: No edema.  Lymphadenopathy:     Cervical: No cervical adenopathy.  Skin:    General: Skin is warm and dry.     Findings: No rash.  Neurological:     Mental Status: He is alert. Mental status is at baseline.  Psychiatric:        Attention and Perception: He is inattentive.        Mood and Affect: Mood normal.        Speech: Speech is delayed and tangential.        Behavior: Behavior normal. Behavior is cooperative.        Thought Content: Thought content normal. Thought content is not paranoid or delusional. Thought content does not include homicidal or suicidal ideation.        Cognition and Memory: Cognition normal.     Lab Results  Component Value Date   WBC 7.3 08/02/2022   HGB 13.0 08/02/2022   HCT 40.0 08/02/2022   PLT 203.0 08/02/2022   GLUCOSE 109 (H) 08/02/2022   CHOL 113 08/02/2022   TRIG 81.0 08/02/2022   HDL 45.90 08/02/2022   LDLCALC 51 08/02/2022   ALT 11 08/02/2022   AST 20 08/02/2022   NA 136 08/02/2022   K 4.9 08/02/2022   CL 101 08/02/2022   CREATININE 1.10 08/02/2022   BUN 12 08/02/2022   CO2 28 08/02/2022   TSH 1.58 08/02/2022   PSA 0.00 (L) 08/09/2020    VAS US CAROTID  Result Date: 03/16/2022 Carotid Arterial Duplex Study Patient Name:  Scott Lindsey  Date of Exam:   03/15/2022 Medical Rec #: [id number]      Accession #:    [id number] Date of Birth: [birth year]      Patient Gender: M Patient Age:   54 years Exam Location:  [facility] Procedure:      VAS US CAROTID Referring Phys: Joni Reining  --------------------------------------------------------------------------------  Indications:       Carotid artery disease and patient denies any cerebrovascular                    symptoms. Risk Factors:      Hypertension, hyperlipidemia, current smoker. Comparison Study:  NA Performing Technologist: Tyna Jaksch RVT  Examination Guidelines: A complete evaluation includes B-mode imaging, spectral Doppler, color Doppler, and power Doppler as needed of all accessible portions of each  vessel. Bilateral testing is considered an integral part of a complete examination. Limited examinations for reoccurring indications may be performed as noted.  Right Carotid Findings: +----------+--------+--------+--------+------------------+---------+           PSV cm/sEDV cm/sStenosisPlaque DescriptionComments  +----------+--------+--------+--------+------------------+---------+ CCA Prox  62      9                                           +----------+--------+--------+--------+------------------+---------+ CCA Distal52      9                                           +----------+--------+--------+--------+------------------+---------+ ICA Prox  97      13      1-39%   calcific          shadowing +----------+--------+--------+--------+------------------+---------+ ICA Distal63      15                                tortuous  +----------+--------+--------+--------+------------------+---------+ ECA       165     10      >50%    heterogenous                +----------+--------+--------+--------+------------------+---------+ +----------+--------+-------+----------------+-------------------+           PSV cm/sEDV cmsDescribe        Arm Pressure (mmHG) +----------+--------+-------+----------------+-------------------+ [id number]            Multiphasic, [id number]                 +----------+--------+-------+----------------+-------------------+  +---------+--------+--+--------+--+---------+ VertebralPSV cm/s71EDV cm/s12Antegrade +---------+--------+--+--------+--+---------+  Left Carotid Findings: +----------+--------+--------+--------+------------------+--------+           PSV cm/sEDV cm/sStenosisPlaque DescriptionComments +----------+--------+--------+--------+------------------+--------+ CCA Prox  55      6                                          +----------+--------+--------+--------+------------------+--------+ CCA Distal50      10                                         +----------+--------+--------+--------+------------------+--------+ ICA Prox  47      7       1-39%   heterogenous               +----------+--------+--------+--------+------------------+--------+ ICA Distal63      17                                tortuous +----------+--------+--------+--------+------------------+--------+ ECA       54      6               heterogenous               +----------+--------+--------+--------+------------------+--------+ +----------+--------+--------+----------------------+-------------------+           PSV cm/sEDV cm/sDescribe              Arm Pressure (mmHG) +----------+--------+--------+----------------------+-------------------+ [id number]  Stenotic and [id number]                 +----------+--------+--------+----------------------+-------------------+ +---------+--------+--+--------+--+----------------------+ VertebralPSV cm/s36EDV cm/s12Antegrade and Atypical +---------+--------+--+--------+--+----------------------+   Summary: Right Carotid: Velocities in the right ICA are consistent with a 1-39% stenosis. Left Carotid: Velocities in the left ICA are consistent with a 1-39% stenosis. Vertebrals:  Right vertebral artery demonstrates antegrade flow. Atypical              antegrade flow in the left vertebral artery. Subclavians: Left subclavian artery was stenotic.  Left subclavian artery flow              was disturbed. Normal flow hemodynamics were seen in the right              subclavian artery. *See table(s) above for measurements and observations.  Electronically signed by Lorine Bears MD on 03/16/2022 at 11:48:00 AM.    Final    DG Chest 2 View  Result Date: 08/02/2022 CLINICAL DATA:  Cough and weight loss. EXAM: CHEST - 2 VIEW COMPARISON:  Chest radiographs 01/25/2022 and 12/25/2015; CT abdomen and pelvis 04/10/2021 FINDINGS: Cardiac silhouette and mediastinal contours are within normal limits. There is again right-greater-than-left interstitial thickening with a peripheral and basilar distribution consistent with chronic interstitial fibrosis seen within the lung bases on prior abdominal CT. The appearance of the fibrotic changes greatest within the right-greater-than-left lung bases appears not simply changed from 01/25/2022 and again increased from 12/25/2015. No definite acute airspace opacity is seen. No pleural effusion or pneumothorax. Flattening of the diaphragms and mild hyperinflation is unchanged. Mild multilevel degenerative disc changes of the thoracic spine. IMPRESSION: 1. No acute cardiopulmonary process. 2. Chronic interstitial fibrosis, greatest within the right-greater-than-left lung bases. This is unchanged from 01/25/2022 and again progressed from 12/25/2015. Electronically Signed   By: Neita Garnet M.D.   On: 08/02/2022 11:58     Assessment & Plan:   Subacute cough- Chest x-ray is negative for mass or infiltrate. -     DG Chest 2 View; Future -     TSH; Future  Weight loss- Labs are negative for secondary causes. -     DG Chest 2 View; Future -     CBC with Differential/Platelet; Future -     Hepatic function panel; Future -     TSH; Future  Essential hypertension- His blood pressure is adequately well-controlled. -     Basic metabolic panel; Future -     CBC with Differential/Platelet; Future -     Hepatic function panel;  Future -     Urinalysis, Routine w reflex microscopic; Future  Hyperlipidemia LDL goal <100 - LDL goal achieved. Doing well on the statin  -     Lipid panel; Future -     Hepatic function panel; Future -     TSH; Future  Other orders -     Shingrix; Inject 0.5 mLs into the muscle once for 1 dose.  Dispense: 0.5 mL; Refill: 1     Follow-up: Return in about 3 months (around 11/02/2022).  Sanda Linger, MD

## 2022-08-02 NOTE — Patient Instructions (Signed)

## 2022-08-07 MED ORDER — SHINGRIX 50 MCG/0.5ML IM SUSR: 0.5000 mL | Freq: Once | INTRAMUSCULAR | 1 refills | Status: AC

## 2022-08-07 MED ORDER — ATORVASTATIN CALCIUM 20 MG PO TABS
20.0000 mg | ORAL_TABLET | Freq: Every day | ORAL | 1 refills | Status: DC
Start: 2022-08-07 — End: 2022-12-11

## 2022-08-24 ENCOUNTER — Other Ambulatory Visit: Payer: Self-pay | Admitting: Adult Health

## 2022-10-12 ENCOUNTER — Ambulatory Visit (INDEPENDENT_AMBULATORY_CARE_PROVIDER_SITE_OTHER): Payer: Medicare Other

## 2022-10-12 ENCOUNTER — Ambulatory Visit (INDEPENDENT_AMBULATORY_CARE_PROVIDER_SITE_OTHER): Payer: Medicare Other | Admitting: Internal Medicine

## 2022-10-12 ENCOUNTER — Encounter: Payer: Self-pay | Admitting: Internal Medicine

## 2022-10-12 VITALS — BP 110/68 | HR 85 | Temp 98.8°F | Ht 68.0 in | Wt 138.0 lb

## 2022-10-12 DIAGNOSIS — I10 Essential (primary) hypertension: Secondary | ICD-10-CM | POA: Diagnosis not present

## 2022-10-12 DIAGNOSIS — R059 Cough, unspecified: Secondary | ICD-10-CM | POA: Insufficient documentation

## 2022-10-12 DIAGNOSIS — R062 Wheezing: Secondary | ICD-10-CM

## 2022-10-12 DIAGNOSIS — R599 Enlarged lymph nodes, unspecified: Secondary | ICD-10-CM | POA: Diagnosis not present

## 2022-10-12 DIAGNOSIS — R0689 Other abnormalities of breathing: Secondary | ICD-10-CM | POA: Diagnosis not present

## 2022-10-12 DIAGNOSIS — Z720 Tobacco use: Secondary | ICD-10-CM

## 2022-10-12 DIAGNOSIS — R051 Acute cough: Secondary | ICD-10-CM

## 2022-10-12 DIAGNOSIS — R918 Other nonspecific abnormal finding of lung field: Secondary | ICD-10-CM | POA: Diagnosis not present

## 2022-10-12 DIAGNOSIS — R0602 Shortness of breath: Secondary | ICD-10-CM | POA: Diagnosis not present

## 2022-10-12 MED ORDER — ALBUTEROL SULFATE HFA 108 (90 BASE) MCG/ACT IN AERS: 2.0000 | INHALATION_SPRAY | Freq: Four times a day (QID) | RESPIRATORY_TRACT | 3 refills | Status: DC | PRN

## 2022-10-12 MED ORDER — HYDROCODONE BIT-HOMATROP MBR 5-1.5 MG/5ML PO SOLN: 5.0000 mL | Freq: Four times a day (QID) | ORAL | 0 refills | Status: AC | PRN

## 2022-10-12 MED ORDER — LEVOFLOXACIN 500 MG PO TABS: 500.0000 mg | ORAL_TABLET | Freq: Every day | ORAL | 0 refills | Status: AC

## 2022-10-12 MED ORDER — PREDNISONE 10 MG PO TABS: ORAL_TABLET | ORAL | 0 refills | Status: DC

## 2022-10-12 MED ORDER — METHYLPREDNISOLONE ACETATE 80 MG/ML IJ SUSP
80.0000 mg | Freq: Once | INTRAMUSCULAR | Status: AC
Start: 2022-10-12 — End: 2022-10-12
  Administered 2022-10-12: 80 mg via INTRAMUSCULAR

## 2022-10-12 NOTE — Progress Notes (Unsigned)
Patient ID: Scott Lindsey, male   DOB: [birth year], 67 y.o.   MRN: [id number]        Chief Complaint: follow up cough , wheezing, smoker, htn       HPI:  Scott Lindsey is a 67 y.o. male .Here with acute onset mild to mod 2-3 days ST, HA, general weakness and malaise, with prod cough greenish sputum, but Pt denies chest pain, increased sob or doe, wheezing, orthopnea, PND, increased LE swelling, palpitations, dizziness or syncopel except for onset mild wheezing sob last pm.   Pt denies polydipsia, polyuria, or new focal neuro s/s.    Pt denies night sweats, loss of appetite, or other constitutional symptoms but has had recent wt loss for unclear reason.   Still smoking, not ready to quit.         Wt Readings from Last 3 Encounters:  10/12/22 138 lb (62.6 kg)  08/02/22 144 lb (65.3 kg)  05/04/22 156 lb (70.8 kg)   BP Readings from Last 3 Encounters:  10/12/22 110/68  08/02/22 134/68  05/04/22 136/88         Past Medical History:  Diagnosis Date   Back pain    Past Surgical History:  Procedure Laterality Date   MULTIPLE TOOTH EXTRACTIONS     ROBOT ASSISTED LAPAROSCOPIC RADICAL PROSTATECTOMY  12/13/2010   Procedure: ROBOTIC ASSISTED LAPAROSCOPIC RADICAL PROSTATECTOMY;  Surgeon: Valetta Fuller, MD;  Location: [facility];  Service: Urology;  Laterality: N/A;  with Bilateral Pelivic Lymph Node Dissection    reports that he has been smoking cigarettes. He has a 12.5 pack-year smoking history. He has never used smokeless tobacco. He reports that he does not currently use alcohol after a past usage of about 10.0 standard drinks of alcohol per week. He reports that he does not use drugs. family history includes Prostate cancer in his brother. No Known Allergies Current Outpatient Medications on File Prior to Visit  Medication Sig Dispense Refill   atorvastatin (LIPITOR) 20 MG tablet Take 1 tablet (20 mg total) by mouth daily. 90 tablet 1   cloNIDine (CATAPRES) 0.1 MG tablet TAKE 1 TABLET BY MOUTH  THREE TIMES DAILY 270 tablet 3   folic acid (FOLVITE) 1 MG tablet Take 1 tablet (1 mg total) by mouth daily. 90 tablet 1   losartan (COZAAR) 100 MG tablet TAKE 1 TABLET BY MOUTH ONCE DAILY. APPOINTMENT NEEDED FOR FUTURE REFILLS 90 tablet 0   magnesium oxide (MAG-OX) 400 (241.3 Mg) MG tablet Take 1 tablet (400 mg total) by mouth 2 (two) times daily. 180 tablet 3   metoprolol succinate (TOPROL-XL) 25 MG 24 hr tablet Take 1 tablet by mouth once daily 30 tablet 11   Multiple Vitamin (MULTIVITAMIN WITH MINERALS) TABS tablet Take 1 tablet by mouth daily.     thiamine (VITAMIN B1) 100 MG tablet Take 1 tablet (100 mg total) by mouth daily. 90 tablet 1   No current facility-administered medications on file prior to visit.        ROS:  All others reviewed and negative.  Objective        PE:  BP 110/68 (BP Location: Right Arm, Patient Position: Sitting, Cuff Size: Normal)   Pulse 85   Temp 98.8 F (37.1 C) (Oral)   Ht 5\' 8"  (1.727 m)   Wt 138 lb (62.6 kg)   SpO2 99%   BMI 20.98 kg/m                 Constitutional:  Pt appears in NAD, mild ill               HENT: Head: NCAT.                Right Ear: External ear normal.                 Left Ear: External ear normal. Bilat tm's with mild erythema.  Max sinus areas non tender.  Pharynx with mild erythema, no exudate                Eyes: . Pupils are equal, round, and reactive to light. Conjunctivae and EOM are normal               Nose: without d/c or deformity               Neck: Neck supple. Gross normal ROM               Cardiovascular: Normal rate and regular rhythm.                 Pulmonary/Chest: Effort normal and breath sounds with few RLL rales and with mild bilateral wheezing.                               Neurological: Pt is alert. At baseline orientation, motor grossly intact               Skin: Skin is warm. No rashes, no other new lesions, LE edema - none               Psychiatric: Pt behavior is normal without agitation    Micro: none  Cardiac tracings I have personally interpreted today:  none  Pertinent Radiological findings (summarize): none   Lab Results  Component Value Date   WBC 7.3 08/02/2022   HGB 13.0 08/02/2022   HCT 40.0 08/02/2022   PLT 203.0 08/02/2022   GLUCOSE 109 (H) 08/02/2022   CHOL 113 08/02/2022   TRIG 81.0 08/02/2022   HDL 45.90 08/02/2022   LDLCALC 51 08/02/2022   ALT 11 08/02/2022   AST 20 08/02/2022   NA 136 08/02/2022   K 4.9 08/02/2022   CL 101 08/02/2022   CREATININE 1.10 08/02/2022   BUN 12 08/02/2022   CO2 28 08/02/2022   TSH 1.58 08/02/2022   PSA 0.00 (L) 08/09/2020   Assessment/Plan:  Scott Lindsey is a 67 y.o. Black or African American [2] male with  has a past medical history of Back pain.  Essential hypertension BP Readings from Last 3 Encounters:  10/12/22 110/68  08/02/22 134/68  05/04/22 136/88   Stable, pt to continue medical treatment clonidine 0.1 tid, losartan 100 every day, toprol xl 25 qd   Tobacco abuse disorder Pt counsled to quit, pt not ready  Cough Mild to mod, c/w bronchitis vs pna with few RLL rales, for cxr, for antibx course levaquin 500 every day, cough med prn,  to f/u any worsening symptoms or concerns  Wheezing Mild to mod, for depomedrol 80 mg IM, prednisone taper, to f/u any worsening symptoms or concerns  Abnormal breath sounds With few RLL rales of unclear significance - for cxr as above  Followup: Return if symptoms worsen or fail to improve.  Oliver Barre, MD 10/14/2022 7:22 PM [facility] - [facility]

## 2022-10-12 NOTE — Patient Instructions (Signed)
You had the steroid shot today  Please take all new medication as prescribed - the antibiotic, cough medicine, and inhaler to use to breathe better  Please stop smoking completely  Please continue all other medications as before, and refills have been done if requested.  Please have the pharmacy call with any other refills you may need.  Please continue your efforts at being more active, low cholesterol diet, and weight control.  Please keep your appointments with your specialists as you may have planned  Please go to the XRAY Department in the first floor for the x-ray testing  You will be contacted by phone if any changes need to be made immediately.  Otherwise, you will receive a letter about your results with an explanation, but please check with MyChart first.

## 2022-10-14 ENCOUNTER — Encounter: Payer: Self-pay | Admitting: Internal Medicine

## 2022-10-14 NOTE — Assessment & Plan Note (Signed)
Mild to mod, c/w bronchitis vs pna with few RLL rales, for cxr, for antibx course levaquin 500 every day, cough med prn,  to f/u any worsening symptoms or concerns

## 2022-10-14 NOTE — Assessment & Plan Note (Signed)
Mild to mod, for depomedrol 80 mg IM, prednisone taper,  to f/u any worsening symptoms or concerns

## 2022-10-14 NOTE — Assessment & Plan Note (Signed)
Pt counsled to quit, pt not ready °

## 2022-10-14 NOTE — Assessment & Plan Note (Signed)
BP Readings from Last 3 Encounters:  10/12/22 110/68  08/02/22 134/68  05/04/22 136/88   Stable, pt to continue medical treatment clonidine 0.1 tid, losartan 100 every day, toprol xl 25 qd

## 2022-10-14 NOTE — Assessment & Plan Note (Signed)
With few RLL rales of unclear significance - for cxr as above

## 2022-10-20 ENCOUNTER — Other Ambulatory Visit: Payer: Self-pay | Admitting: Internal Medicine

## 2022-10-20 DIAGNOSIS — D520 Dietary folate deficiency anemia: Secondary | ICD-10-CM

## 2022-11-02 ENCOUNTER — Other Ambulatory Visit: Payer: Self-pay | Admitting: Internal Medicine

## 2022-11-02 DIAGNOSIS — R911 Solitary pulmonary nodule: Secondary | ICD-10-CM

## 2022-11-02 DIAGNOSIS — R918 Other nonspecific abnormal finding of lung field: Secondary | ICD-10-CM

## 2022-11-02 DIAGNOSIS — J849 Interstitial pulmonary disease, unspecified: Secondary | ICD-10-CM

## 2022-11-19 ENCOUNTER — Other Ambulatory Visit: Payer: Self-pay | Admitting: Cardiology

## 2022-11-30 ENCOUNTER — Telehealth: Payer: Self-pay | Admitting: Internal Medicine

## 2022-11-30 MED ORDER — HYDROCODONE BIT-HOMATROP MBR 5-1.5 MG/5ML PO SOLN: 5.0000 mL | Freq: Four times a day (QID) | ORAL | 0 refills | Status: AC | PRN

## 2022-11-30 NOTE — Telephone Encounter (Signed)
Patient was seen by Dr. Jonny Ruiz in October for a cough. He was prescribed HYDROcodone bit-homatropine (HYCODAN) 5-1.5 MG/5ML syrup and they ended up only getting half from the pharmacy because of the price. Patient still has the cough and wanted to know if they could be prescribed more or if he needs to be seen. Best callback is [phone].

## 2022-11-30 NOTE — Telephone Encounter (Signed)
Please advise 

## 2022-11-30 NOTE — Telephone Encounter (Signed)
Ok refill done

## 2022-12-11 ENCOUNTER — Other Ambulatory Visit: Payer: Self-pay

## 2022-12-11 ENCOUNTER — Telehealth: Payer: Self-pay | Admitting: Cardiology

## 2022-12-11 DIAGNOSIS — E785 Hyperlipidemia, unspecified: Secondary | ICD-10-CM

## 2022-12-11 MED ORDER — LOSARTAN POTASSIUM 100 MG PO TABS: ORAL_TABLET | ORAL | 3 refills | Status: DC

## 2022-12-11 MED ORDER — ATORVASTATIN CALCIUM 20 MG PO TABS: 20.0000 mg | ORAL_TABLET | Freq: Every day | ORAL | 1 refills | Status: DC

## 2022-12-11 NOTE — Telephone Encounter (Signed)
*  STAT* If patient is at the pharmacy, call can be transferred to refill team.   1. Which medications need to be refilled? (please list name of each medication and dose if known) losartan (COZAAR) 100 MG tablet  atorvastatin (LIPITOR) 20 MG tablet    2. Would you like to learn more about the convenience, safety, & potential cost savings by using the [facility]? N/A   3. Are you open to using the [facility] (Type [facility]. N/A   4. Which pharmacy/location (including street and city if local pharmacy) is medication to be sent to?  [id number] - [place], [place] - [address]     5. Do they need a 30 day or 90 day supply? Needs enough medication to last him until 01/03 appointment

## 2022-12-20 NOTE — Patient Instructions (Addendum)
      Call [facility] - [phone] to schedule your CT scan of your chest  Any problems getting the CT scan ordered let us know.   Medications changes include :   prednisone 40 mg daily x 5 days.  Doxycycline 100 mg twice daily x 10 days  A referral was ordered for pulmonary - they will call you to schedule an appointment.     Stop smoking   Return if symptoms worsen or fail to improve.

## 2022-12-20 NOTE — Progress Notes (Unsigned)
Subjective:    Patient ID: Scott Lindsey, male    DOB: 08-Jun-1955, 67 y.o.   MRN: [id number]      HPI [name] is here for  Chief Complaint  Patient presents with   Cough    Cough off and on x 2 weeks  He was initially here alone and is a poor historian.  His wife did join the visit later on.  Had a cold 2 weeks ago.  He was initially experiencing cough, wheeze, nasal congestion and increased SOB.    He did not have any fever, sore throat.   Some of the cold symptoms have gone away.  He still has a cough.  It is wet he thinks.  He still has wheezing.  His chronic SOB is the same.     Couple months ago he was here for cold symptoms as well.  He did have a chest x-ray at that time a CT scan was ordered for further evaluation as well as a pulmonary referral.  Neither 1 of these were done.  Apparently they tried to call him several times but his voicemail was not set up-looks like they were calling the home phone since that is his current primary number and pulmonary referral was canceled  Medications and allergies reviewed with patient and updated if appropriate.  Current Outpatient Medications on File Prior to Visit  Medication Sig Dispense Refill   albuterol (VENTOLIN HFA) 108 (90 Base) MCG/ACT inhaler Inhale 2 puffs into the lungs every 6 (six) hours as needed for wheezing or shortness of breath. 8 g 3   atorvastatin (LIPITOR) 20 MG tablet Take 1 tablet (20 mg total) by mouth daily. 90 tablet 1   cloNIDine (CATAPRES) 0.1 MG tablet TAKE 1 TABLET BY MOUTH THREE TIMES DAILY 270 tablet 3   folic acid (FOLVITE) 1 MG tablet Take 1 tablet by mouth once daily 90 tablet 0   losartan (COZAAR) 100 MG tablet Take 1 tablet by mouth once daily 90 tablet 3   magnesium oxide (MAG-OX) 400 (241.3 Mg) MG tablet Take 1 tablet (400 mg total) by mouth 2 (two) times daily. 180 tablet 3   metoprolol succinate (TOPROL-XL) 25 MG 24 hr tablet Take 1 tablet by mouth once daily 30 tablet 11   Multiple Vitamin  (MULTIVITAMIN WITH MINERALS) TABS tablet Take 1 tablet by mouth daily.     predniSONE (DELTASONE) 10 MG tablet 3 tabs by mouth per day for 3 days,2tabs per day for 3 days,1tab per day for 3 days 18 tablet 0   thiamine (VITAMIN B1) 100 MG tablet Take 1 tablet (100 mg total) by mouth daily. 90 tablet 1   No current facility-administered medications on file prior to visit.    Review of Systems  Constitutional:  Negative for fever.  HENT:  Negative for congestion, sinus pain and sore throat.   Respiratory:  Positive for cough, shortness of breath and wheezing.   Cardiovascular:  Negative for chest pain.  Neurological:  Positive for dizziness (once - transient). Negative for headaches.       Objective:   Vitals:   12/21/22 1537  BP: 112/78  Pulse: 85  Temp: 98.3 F (36.8 C)  SpO2: 96%   BP Readings from Last 3 Encounters:  12/21/22 112/78  10/12/22 110/68  08/02/22 134/68   Wt Readings from Last 3 Encounters:  12/21/22 133 lb (60.3 kg)  10/12/22 138 lb (62.6 kg)  08/02/22 144 lb (65.3 kg)   Body mass  index is 20.22 kg/m.    Physical Exam Constitutional:      General: He is not in acute distress.    Appearance: He is not ill-appearing.  HENT:     Head: Normocephalic.     Mouth/Throat:     Mouth: Mucous membranes are dry.     Pharynx: No oropharyngeal exudate or posterior oropharyngeal erythema.  Eyes:     Conjunctiva/sclera: Conjunctivae normal.  Cardiovascular:     Rate and Rhythm: Tachycardia present. Rhythm irregular.  Pulmonary:     Effort: Pulmonary effort is normal. No respiratory distress.     Breath sounds: Wheezing and rales (lower b/l 1/3 of lungs - dry) present.  Musculoskeletal:     Cervical back: Neck supple. No tenderness.  Lymphadenopathy:     Cervical: No cervical adenopathy.  Skin:    General: Skin is warm and dry.     Findings: No rash.  Neurological:     Mental Status: He is alert.        EKG -sinus tachycardia with frequent PVCs at  123 bpm, low voltage, possible anterior infarct.  Compared to last EKG from 05/04/2022 tachycardia is new, but PVCs are not but are more frequent.  DG Chest 2 View CLINICAL DATA:  Cough and shortness of breath for approximately 6 months.  EXAM: CHEST - 2 VIEW  COMPARISON:  PA and lateral chest 08/02/2022 and 01/25/2022. CT abdomen and pelvis 04/10/2021.  FINDINGS: No consolidative process, pneumothorax or pleural effusion is identified. Chronic fibrotic change is worst in the lung bases and greater on the right. Thickening of the right paratracheal stripe has increased since the 01/25/2022 exam is most worrisome with lymphadenopathy. No pneumothorax or pleural effusion. Heart size is normal. No acute or focal bony abnormality.  IMPRESSION: Findings worrisome for mediastinal lymphadenopathy. This could be related to pulmonary fibrosis but chest CT with contrast is recommended for further evaluation. Fibrosis has an appearance suggestive you IP. This could also be evaluated on chest CT.  Electronically Signed   By: Drusilla Kanner M.D.   On: 11/02/2022 10:58    Assessment & Plan:     COPD exacerbation, pulmonary fibrosis: Acute Recent cold Has wheezing on exams-likely COPD exacerbation secondary to URI 2 weeks ago He is a poor historian and it is difficult to tell if there is still some infection remaining Doxycycline 100 twice daily x 10 days, prednisone 40 mg daily x 5 days  Abnormal chest x-ray: CT scan of this chest was ordered in October, but has not been scheduled-they apparently tried calling him on his home phone but no voicemail was set up Advised him to call and schedule the CT scan and advised his wife to change his primary number in his chart He has had weight loss-concern for possible lung cancer Referral to pulmonary ordered since last referral looks like it is closed  Frequent PVCs: Heart rate on exam was tachycardic and irregular-heart rate during the  visit was variable EKG with similar to previous with frequent PVCs He has seen cardiology for this and it is likely related to chronic hypomagnesemia from alcoholism Encouraged taking his supplements

## 2022-12-21 ENCOUNTER — Ambulatory Visit (INDEPENDENT_AMBULATORY_CARE_PROVIDER_SITE_OTHER): Payer: Medicare Other | Admitting: Internal Medicine

## 2022-12-21 ENCOUNTER — Encounter: Payer: Self-pay | Admitting: Internal Medicine

## 2022-12-21 VITALS — BP 112/78 | HR 85 | Temp 98.3°F | Ht 68.0 in | Wt 133.0 lb

## 2022-12-21 DIAGNOSIS — J441 Chronic obstructive pulmonary disease with (acute) exacerbation: Secondary | ICD-10-CM | POA: Diagnosis not present

## 2022-12-21 DIAGNOSIS — J8410 Pulmonary fibrosis, unspecified: Secondary | ICD-10-CM

## 2022-12-21 DIAGNOSIS — I499 Cardiac arrhythmia, unspecified: Secondary | ICD-10-CM

## 2022-12-21 MED ORDER — DOXYCYCLINE HYCLATE 100 MG PO TABS: 100.0000 mg | ORAL_TABLET | Freq: Two times a day (BID) | ORAL | 0 refills | Status: AC

## 2022-12-21 MED ORDER — PREDNISONE 20 MG PO TABS: 40.0000 mg | ORAL_TABLET | Freq: Every day | ORAL | 0 refills | Status: AC

## 2022-12-24 ENCOUNTER — Other Ambulatory Visit: Payer: Self-pay | Admitting: Cardiology

## 2022-12-25 MED ORDER — MAGNESIUM OXIDE 400 MG PO TABS: 400.0000 mg | ORAL_TABLET | Freq: Every day | ORAL | 3 refills | Status: DC

## 2023-01-06 ENCOUNTER — Encounter (HOSPITAL_BASED_OUTPATIENT_CLINIC_OR_DEPARTMENT_OTHER): Payer: Self-pay | Admitting: Emergency Medicine

## 2023-01-06 ENCOUNTER — Emergency Department (HOSPITAL_BASED_OUTPATIENT_CLINIC_OR_DEPARTMENT_OTHER): Payer: Medicare Other

## 2023-01-06 ENCOUNTER — Inpatient Hospital Stay (HOSPITAL_BASED_OUTPATIENT_CLINIC_OR_DEPARTMENT_OTHER)
Admission: EM | Admit: 2023-01-06 | Discharge: 2023-01-16 | DRG: 987 | Disposition: A | Discharge status: Home / Self Care | Payer: Medicare Other | Attending: Family Medicine | Admitting: Family Medicine

## 2023-01-06 ENCOUNTER — Other Ambulatory Visit: Payer: Self-pay

## 2023-01-06 DIAGNOSIS — I2510 Atherosclerotic heart disease of native coronary artery without angina pectoris: Secondary | ICD-10-CM | POA: Diagnosis not present

## 2023-01-06 DIAGNOSIS — N179 Acute kidney failure, unspecified: Secondary | ICD-10-CM | POA: Diagnosis not present

## 2023-01-06 DIAGNOSIS — E222 Syndrome of inappropriate secretion of antidiuretic hormone: Secondary | ICD-10-CM | POA: Diagnosis present

## 2023-01-06 DIAGNOSIS — J9601 Acute respiratory failure with hypoxia: Secondary | ICD-10-CM | POA: Diagnosis present

## 2023-01-06 DIAGNOSIS — I480 Paroxysmal atrial fibrillation: Secondary | ICD-10-CM | POA: Diagnosis present

## 2023-01-06 DIAGNOSIS — I4891 Unspecified atrial fibrillation: Secondary | ICD-10-CM

## 2023-01-06 DIAGNOSIS — R059 Cough, unspecified: Secondary | ICD-10-CM | POA: Diagnosis present

## 2023-01-06 DIAGNOSIS — J440 Chronic obstructive pulmonary disease with acute lower respiratory infection: Secondary | ICD-10-CM | POA: Diagnosis present

## 2023-01-06 DIAGNOSIS — C770 Secondary and unspecified malignant neoplasm of lymph nodes of head, face and neck: Secondary | ICD-10-CM | POA: Diagnosis not present

## 2023-01-06 DIAGNOSIS — J8410 Pulmonary fibrosis, unspecified: Secondary | ICD-10-CM | POA: Diagnosis present

## 2023-01-06 DIAGNOSIS — Z9079 Acquired absence of other genital organ(s): Secondary | ICD-10-CM

## 2023-01-06 DIAGNOSIS — C772 Secondary and unspecified malignant neoplasm of intra-abdominal lymph nodes: Secondary | ICD-10-CM | POA: Diagnosis present

## 2023-01-06 DIAGNOSIS — E43 Unspecified severe protein-calorie malnutrition: Secondary | ICD-10-CM | POA: Diagnosis present

## 2023-01-06 DIAGNOSIS — Z6820 Body mass index (BMI) 20.0-20.9, adult: Secondary | ICD-10-CM | POA: Diagnosis not present

## 2023-01-06 DIAGNOSIS — J441 Chronic obstructive pulmonary disease with (acute) exacerbation: Secondary | ICD-10-CM | POA: Diagnosis not present

## 2023-01-06 DIAGNOSIS — C342 Malignant neoplasm of middle lobe, bronchus or lung: Secondary | ICD-10-CM | POA: Diagnosis not present

## 2023-01-06 DIAGNOSIS — R590 Localized enlarged lymph nodes: Secondary | ICD-10-CM | POA: Diagnosis not present

## 2023-01-06 DIAGNOSIS — K8689 Other specified diseases of pancreas: Secondary | ICD-10-CM | POA: Diagnosis not present

## 2023-01-06 DIAGNOSIS — R9089 Other abnormal findings on diagnostic imaging of central nervous system: Secondary | ICD-10-CM | POA: Diagnosis not present

## 2023-01-06 DIAGNOSIS — R0902 Hypoxemia: Principal | ICD-10-CM

## 2023-01-06 DIAGNOSIS — C787 Secondary malignant neoplasm of liver and intrahepatic bile duct: Secondary | ICD-10-CM | POA: Diagnosis present

## 2023-01-06 DIAGNOSIS — D638 Anemia in other chronic diseases classified elsewhere: Secondary | ICD-10-CM | POA: Diagnosis present

## 2023-01-06 DIAGNOSIS — J189 Pneumonia, unspecified organism: Secondary | ICD-10-CM | POA: Diagnosis present

## 2023-01-06 DIAGNOSIS — J449 Chronic obstructive pulmonary disease, unspecified: Secondary | ICD-10-CM | POA: Diagnosis present

## 2023-01-06 DIAGNOSIS — R918 Other nonspecific abnormal finding of lung field: Secondary | ICD-10-CM

## 2023-01-06 DIAGNOSIS — I6782 Cerebral ischemia: Secondary | ICD-10-CM | POA: Diagnosis not present

## 2023-01-06 DIAGNOSIS — E785 Hyperlipidemia, unspecified: Secondary | ICD-10-CM | POA: Diagnosis present

## 2023-01-06 DIAGNOSIS — Z8042 Family history of malignant neoplasm of prostate: Secondary | ICD-10-CM | POA: Diagnosis not present

## 2023-01-06 DIAGNOSIS — F1010 Alcohol abuse, uncomplicated: Secondary | ICD-10-CM | POA: Diagnosis present

## 2023-01-06 DIAGNOSIS — F17210 Nicotine dependence, cigarettes, uncomplicated: Secondary | ICD-10-CM | POA: Diagnosis not present

## 2023-01-06 DIAGNOSIS — C801 Malignant (primary) neoplasm, unspecified: Secondary | ICD-10-CM | POA: Diagnosis not present

## 2023-01-06 DIAGNOSIS — D696 Thrombocytopenia, unspecified: Secondary | ICD-10-CM | POA: Diagnosis not present

## 2023-01-06 DIAGNOSIS — R599 Enlarged lymph nodes, unspecified: Secondary | ICD-10-CM | POA: Diagnosis not present

## 2023-01-06 DIAGNOSIS — I10 Essential (primary) hypertension: Secondary | ICD-10-CM | POA: Diagnosis present

## 2023-01-06 DIAGNOSIS — J438 Other emphysema: Secondary | ICD-10-CM | POA: Diagnosis not present

## 2023-01-06 DIAGNOSIS — R0602 Shortness of breath: Secondary | ICD-10-CM | POA: Diagnosis not present

## 2023-01-06 DIAGNOSIS — Z79899 Other long term (current) drug therapy: Secondary | ICD-10-CM

## 2023-01-06 DIAGNOSIS — C3490 Malignant neoplasm of unspecified part of unspecified bronchus or lung: Secondary | ICD-10-CM | POA: Diagnosis not present

## 2023-01-06 DIAGNOSIS — Z8546 Personal history of malignant neoplasm of prostate: Secondary | ICD-10-CM

## 2023-01-06 DIAGNOSIS — K7689 Other specified diseases of liver: Secondary | ICD-10-CM | POA: Diagnosis not present

## 2023-01-06 LAB — RESP PANEL BY RT-PCR (RSV, FLU A&B, COVID)  RVPGX2
Influenza A by PCR: NEGATIVE
Influenza B by PCR: NEGATIVE
Resp Syncytial Virus by PCR: NEGATIVE
SARS Coronavirus 2 by RT PCR: NEGATIVE

## 2023-01-06 LAB — TROPONIN I (HIGH SENSITIVITY)
Troponin I (High Sensitivity): 13 ng/L (ref <18)
Troponin I (High Sensitivity): 13 ng/L (ref <18)

## 2023-01-06 LAB — CBC
HCT: 30.2 % — ABNORMAL LOW (ref 39.0–52.0)
Hemoglobin: 10.1 g/dL — ABNORMAL LOW (ref 13.0–17.0)
MCH: 31.1 pg (ref 26.0–34.0)
MCHC: 33.4 g/dL (ref 30.0–36.0)
MCV: 92.9 fL (ref 80.0–100.0)
Platelets: 161 10*3/uL (ref 150–400)
RBC: 3.25 MIL/uL — ABNORMAL LOW (ref 4.22–5.81)
RDW: 15.2 % (ref 11.5–15.5)
WBC: 9.4 10*3/uL (ref 4.0–10.5)
nRBC: 0 % (ref 0.0–0.2)

## 2023-01-06 LAB — BASIC METABOLIC PANEL
Anion gap: 9 (ref 5–15)
BUN: 20 mg/dL (ref 8–23)
CO2: 25 mmol/L (ref 22–32)
Calcium: 10.4 mg/dL — ABNORMAL HIGH (ref 8.9–10.3)
Chloride: 98 mmol/L (ref 98–111)
Creatinine, Ser: 1.33 mg/dL — ABNORMAL HIGH (ref 0.61–1.24)
GFR, Estimated: 58 mL/min — ABNORMAL LOW (ref >60)
Glucose, Bld: 119 mg/dL — ABNORMAL HIGH (ref 70–99)
Potassium: 4.2 mmol/L (ref 3.5–5.1)
Sodium: 132 mmol/L — ABNORMAL LOW (ref 135–145)

## 2023-01-06 MED ORDER — IOHEXOL 350 MG/ML SOLN
75.0000 mL | Freq: Once | INTRAVENOUS | Status: AC | PRN
  Administered 2023-01-06: 75 mL via INTRAVENOUS

## 2023-01-06 MED ORDER — ARFORMOTEROL TARTRATE 15 MCG/2ML IN NEBU
15.0000 ug | INHALATION_SOLUTION | Freq: Two times a day (BID) | RESPIRATORY_TRACT | Status: DC
Start: 2023-01-06 — End: 2023-01-16
  Administered 2023-01-06 – 2023-01-16 (×19): 15 ug via RESPIRATORY_TRACT
  Filled 2023-01-06 (×21): qty 2

## 2023-01-06 MED ORDER — CLONIDINE HCL 0.1 MG PO TABS
0.1000 mg | ORAL_TABLET | Freq: Three times a day (TID) | ORAL | Status: DC
  Administered 2023-01-06 – 2023-01-16 (×27): 0.1 mg via ORAL
  Filled 2023-01-06 (×27): qty 1

## 2023-01-06 MED ORDER — DILTIAZEM HCL 25 MG/5ML IV SOLN
10.0000 mg | Freq: Once | INTRAVENOUS | Status: AC
  Administered 2023-01-06: 10 mg via INTRAVENOUS
  Filled 2023-01-06: qty 5

## 2023-01-06 MED ORDER — ACETAMINOPHEN 650 MG RE SUPP: 650.0000 mg | Freq: Four times a day (QID) | RECTAL | Status: DC | PRN

## 2023-01-06 MED ORDER — ATORVASTATIN CALCIUM 20 MG PO TABS
20.0000 mg | ORAL_TABLET | Freq: Every day | ORAL | Status: DC
  Administered 2023-01-07 – 2023-01-16 (×9): 20 mg via ORAL
  Filled 2023-01-06 (×9): qty 1

## 2023-01-06 MED ORDER — IPRATROPIUM-ALBUTEROL 0.5-2.5 (3) MG/3ML IN SOLN
3.0000 mL | Freq: Once | RESPIRATORY_TRACT | Status: AC
  Administered 2023-01-06: 3 mL via RESPIRATORY_TRACT
  Filled 2023-01-06: qty 3

## 2023-01-06 MED ORDER — LOSARTAN POTASSIUM 50 MG PO TABS
100.0000 mg | ORAL_TABLET | Freq: Every day | ORAL | Status: DC
  Administered 2023-01-07 – 2023-01-11 (×4): 100 mg via ORAL
  Filled 2023-01-06 (×4): qty 2

## 2023-01-06 MED ORDER — ONDANSETRON HCL 4 MG/2ML IJ SOLN: 4.0000 mg | Freq: Four times a day (QID) | INTRAMUSCULAR | Status: DC | PRN

## 2023-01-06 MED ORDER — CEFTRIAXONE SODIUM 2 G IJ SOLR
2.0000 g | INTRAMUSCULAR | Status: DC
  Administered 2023-01-06 – 2023-01-11 (×6): 2 g via INTRAVENOUS
  Filled 2023-01-06 (×6): qty 20

## 2023-01-06 MED ORDER — IPRATROPIUM-ALBUTEROL 0.5-2.5 (3) MG/3ML IN SOLN
3.0000 mL | Freq: Four times a day (QID) | RESPIRATORY_TRACT | Status: DC
  Administered 2023-01-06 – 2023-01-07 (×2): 3 mL via RESPIRATORY_TRACT
  Filled 2023-01-06 (×2): qty 3

## 2023-01-06 MED ORDER — ONDANSETRON HCL 4 MG PO TABS
4.0000 mg | ORAL_TABLET | Freq: Four times a day (QID) | ORAL | Status: DC | PRN
Start: 2023-01-06 — End: 2023-01-16

## 2023-01-06 MED ORDER — ENOXAPARIN SODIUM 40 MG/0.4ML IJ SOSY
40.0000 mg | PREFILLED_SYRINGE | Freq: Every day | INTRAMUSCULAR | Status: DC
  Administered 2023-01-06 – 2023-01-09 (×4): 40 mg via SUBCUTANEOUS
  Filled 2023-01-06 (×4): qty 0.4

## 2023-01-06 MED ORDER — OXYCODONE HCL 5 MG PO TABS: 5.0000 mg | ORAL_TABLET | ORAL | Status: DC | PRN

## 2023-01-06 MED ORDER — METHYLPREDNISOLONE SODIUM SUCC 125 MG IJ SOLR
125.0000 mg | Freq: Once | INTRAMUSCULAR | Status: AC
  Administered 2023-01-06: 125 mg via INTRAVENOUS
  Filled 2023-01-06: qty 2

## 2023-01-06 MED ORDER — BUDESONIDE 0.25 MG/2ML IN SUSP
0.2500 mg | Freq: Two times a day (BID) | RESPIRATORY_TRACT | Status: DC
  Administered 2023-01-06 – 2023-01-16 (×19): 0.25 mg via RESPIRATORY_TRACT
  Filled 2023-01-06 (×21): qty 2

## 2023-01-06 MED ORDER — DILTIAZEM HCL-DEXTROSE 125-5 MG/125ML-% IV SOLN (PREMIX)
5.0000 mg/h | INTRAVENOUS | Status: DC
Start: 2023-01-06 — End: 2023-01-06
  Administered 2023-01-06: 10 mg/h via INTRAVENOUS
  Administered 2023-01-06: 5 mg/h via INTRAVENOUS
  Filled 2023-01-06: qty 125

## 2023-01-06 MED ORDER — PREDNISONE 50 MG PO TABS
50.0000 mg | ORAL_TABLET | Freq: Every day | ORAL | Status: DC
Start: 2023-01-07 — End: 2023-01-09
  Administered 2023-01-07 – 2023-01-09 (×2): 50 mg via ORAL
  Filled 2023-01-06 (×2): qty 1

## 2023-01-06 MED ORDER — SODIUM CHLORIDE 0.9 % IV SOLN
500.0000 mg | Freq: Once | INTRAVENOUS | Status: AC
  Administered 2023-01-06: 500 mg via INTRAVENOUS
  Filled 2023-01-06: qty 5

## 2023-01-06 MED ORDER — METOPROLOL SUCCINATE ER 25 MG PO TB24: 25.0000 mg | ORAL_TABLET | Freq: Every day | ORAL | Status: DC

## 2023-01-06 MED ORDER — ACETAMINOPHEN 325 MG PO TABS: 650.0000 mg | ORAL_TABLET | Freq: Four times a day (QID) | ORAL | Status: DC | PRN

## 2023-01-06 MED ORDER — METOPROLOL TARTRATE 25 MG PO TABS
25.0000 mg | ORAL_TABLET | Freq: Two times a day (BID) | ORAL | Status: DC
Start: 2023-01-06 — End: 2023-01-12
  Administered 2023-01-07 – 2023-01-12 (×11): 25 mg via ORAL
  Filled 2023-01-06 (×11): qty 1

## 2023-01-06 NOTE — ED Notes (Signed)
Pt ambulated to the bathroom and back and oxygen sats are 80%. Returned to bed.

## 2023-01-06 NOTE — ED Notes (Signed)
Attempt to call report, no answer

## 2023-01-06 NOTE — ED Provider Notes (Signed)
EMERGENCY DEPARTMENT AT [facility] Provider Note   CSN: [id number] Arrival date & time: 01/06/23  1333     History  Chief Complaint  Patient presents with   Cough    Scott Lindsey is a 67 y.o. male.  67 year old male with past medical history of tobacco abuse and hyperlipidemia presents the emergency department today with cough and shortness of breath.  The patient has had some congestion as well as cough now that is been ongoing now for the past few weeks.  He denies any associated chest pain.  He was prescribed doxycycline as well as some steroids in early December which did not seem to help much.  He does have a CT scan ordered as an outpatient in early January.  The patient reports that he is coughing up some phlegm.  He does have a longstanding smoking history.  He does not have a formal diagnosis of COPD at this time.   Cough      Home Medications Prior to Admission medications   Medication Sig Start Date End Date Taking? Authorizing Provider  albuterol (VENTOLIN HFA) 108 (90 Base) MCG/ACT inhaler Inhale 2 puffs into the lungs every 6 (six) hours as needed for wheezing or shortness of breath. 10/12/22  Yes Corwin Levins, MD  atorvastatin (LIPITOR) 20 MG tablet Take 1 tablet (20 mg total) by mouth daily. 12/11/22  Yes Lennette Bihari, MD  cloNIDine (CATAPRES) 0.1 MG tablet TAKE 1 TABLET BY MOUTH THREE TIMES DAILY Patient taking differently: Take 0.1 mg by mouth See admin instructions. Take 0.1 mg by mouth one to two times a day 07/17/22  Yes Little Ishikawa, MD  folic acid (FOLVITE) 1 MG tablet Take 1 tablet by mouth once daily 10/20/22  Yes Etta Grandchild, MD  losartan (COZAAR) 100 MG tablet Take 1 tablet by mouth once daily Patient taking differently: Take 100 mg by mouth daily. 12/11/22  Yes Lennette Bihari, MD  magnesium oxide (MAG-OX) 400 MG tablet Take 1 tablet (400 mg total) by mouth daily. Patient taking differently: Take 100 mg by mouth in  the morning, at noon, in the evening, and at bedtime. 12/25/22  Yes Little Ishikawa, MD  metoprolol succinate (TOPROL-XL) 25 MG 24 hr tablet Take 1 tablet by mouth once daily 07/17/22  Yes Little Ishikawa, MD  multivitamin (ONE-A-DAY MEN'S) TABS tablet Take 1 tablet by mouth daily with breakfast.   Yes [provider]  thiamine (VITAMIN B1) 100 MG tablet Take 1 tablet (100 mg total) by mouth daily. Patient not taking: Reported on 01/06/2023 01/25/22   Etta Grandchild, MD      Allergies    Patient has no known allergies.    Review of Systems   Review of Systems  HENT:  Positive for congestion.   Respiratory:  Positive for cough.   All other systems reviewed and are negative.   Physical Exam Updated Vital Signs BP 130/70 (BP Location: Right Arm)   Pulse (!) 44   Temp 97.8 F (36.6 C) (Oral)   Resp 20   Wt 60.1 kg   SpO2 94%   BMI 20.15 kg/m  Physical Exam Vitals and nursing note reviewed.   Gen: NAD Eyes: PERRL, EOMI HEENT: no oropharyngeal swelling Neck: trachea midline Resp: Bronchospastic cough with mild wheezes with forced expiration Card: RRR, no murmurs, rubs, or gallops Abd: nontender, nondistended Extremities: no calf tenderness, no edema Vascular: 2+ radial pulses bilaterally, 2+ DP pulses bilaterally  Skin: no rashes Psyc: acting appropriately   ED Results / Procedures / Treatments   Labs (all labs ordered are listed, but only abnormal results are displayed) Labs Reviewed  BASIC METABOLIC PANEL - Abnormal; Notable for the following components:      Result Value   Sodium 132 (*)    Glucose, Bld 119 (*)    Creatinine, Ser 1.33 (*)    Calcium 10.4 (*)    GFR, Estimated 58 (*)    All other components within normal limits  CBC - Abnormal; Notable for the following components:   RBC 3.25 (*)    Hemoglobin 10.1 (*)    HCT 30.2 (*)    All other components within normal limits  RESP PANEL BY RT-PCR (RSV, FLU A&B, COVID)  [id number]  BASIC  METABOLIC PANEL  CBC  TROPONIN I (HIGH SENSITIVITY)  TROPONIN I (HIGH SENSITIVITY)    EKG EKG Interpretation Date/Time:  Sunday January 06 2023 16:35:55 EST Ventricular Rate:  129 PR Interval:    QRS Duration:  85 QT Interval:  330 QTC Calculation: 488 R Axis:   60  Text Interpretation: Atrial fibrillation Low voltage, extremity and precordial leads Borderline prolonged QT interval Confirmed by Beckey Downing [phone]) on 01/06/2023 5:19:07 PM  Radiology CT Angio Chest PE W and/or Wo Contrast Result Date: 01/06/2023 CLINICAL DATA:  Shortness of breath on exertion EXAM: CT ANGIOGRAPHY CHEST WITH CONTRAST TECHNIQUE: Multidetector CT imaging of the chest was performed using the standard protocol during bolus administration of intravenous contrast. Multiplanar CT image reconstructions and MIPs were obtained to evaluate the vascular anatomy. RADIATION DOSE REDUCTION: This exam was performed according to the departmental dose-optimization program which includes automated exposure control, adjustment of the mA and/or kV according to patient size and/or use of iterative reconstruction technique. CONTRAST:  75mL OMNIPAQUE IOHEXOL 350 MG/ML SOLN COMPARISON:  Chest radiograph dated 10/12/2022, CT abdomen and pelvis dated 04/10/2021 FINDINGS: Cardiovascular: The study is high quality for the evaluation of pulmonary embolism. Effacement of the right main pulmonary artery and most notably the right upper lobar pulmonary artery due to bulky mediastinal lymphadenopathy. There are no filling defects in the central, lobar, segmental or subsegmental pulmonary artery branches to suggest acute pulmonary embolism. Great vessels are normal in course and caliber. Normal heart size. No significant pericardial fluid/thickening. Coronary artery calcifications and aortic atherosclerosis. Severe luminal narrowing of the SVC. Reflux of contrast material into the hepatic veins, suggesting a degree of right heart dysfunction.  Multiple collaterals within the skin and paraspinal region. Mediastinum/Nodes: Imaged thyroid gland without nodules meeting criteria for imaging follow-up by size. Normal esophagus. Bulky nodal conglomerate centered at the level of the carina extending into the right hilum spans approximately 8.1 x 4.8 cm (4:70) and results in mass effect and circumferential luminal effacement of the trachea, SVC, and pulmonary arteries. 19 mm right anterior mediastinal lymph node (4:70). Lungs/Pleura: Marked effacement of the distal trachea and bilateral proximal bronchi, right-greater-than-left due to mediastinal lymphadenopathy. Small volume adherent secretions within the trachea. Irregular consolidation in the right middle lobe with tethering and traction of the right major fissure measures 4.2 x 3.1 cm (6:112). Superior segment right lower lobe nodule measures 2.3 x 2.2 cm (6:93). Additional peripheral nodule in the right middle lobe measures 0.9 x 0.7 cm (6:106). These appear new compared to 04/10/2021. Background findings of pulmonary fibrosis with right-greater-than-left lower lobe predominant honeycombing and traction bronchiectasis. No pneumothorax. No pleural effusion. Upper abdomen: Ill-defined hypodensity centered within segment 4 measures 4.5  x 3.5 cm (4:142), new from 04/10/2021. Musculoskeletal: No acute or abnormal lytic or blastic osseous lesions. Review of the MIP images confirms the above findings. IMPRESSION: 1. No evidence of pulmonary embolism. 2. Bulky nodal conglomerate centered at the level of the carina extending into the right hilum results in mass effect and circumferential luminal effacement of the trachea, SVC, and pulmonary arteries. Findings are highly suspicious for metastatic disease. 3. Irregular consolidation in the right middle lobe with tethering and traction of the right major fissure measures 4.2 x 3.1 cm, suspicious for malignancy, either primary or metastatic. Additional superior segment  right lower lobe nodule and peripheral right middle lobe nodule. These appear new compared to 04/10/2021. 4. Ill-defined hypodensity centered within segment 4 measures 4.5 x 3.5 cm, new from 04/10/2021, suspicious for metastatic disease. 5. Reflux of contrast material into the hepatic veins, suggesting a degree of right heart dysfunction. 6.  Aortic Atherosclerosis (ICD10-I70.0). Electronically Signed   By: Agustin Cree M.D.   On: 01/06/2023 16:36    Procedures Procedures    Medications Ordered in ED Medications  atorvastatin (LIPITOR) tablet 20 mg (has no administration in time range)  cloNIDine (CATAPRES) tablet 0.1 mg (0.1 mg Oral Given 01/06/23 2157)  losartan (COZAAR) tablet 100 mg (has no administration in time range)  enoxaparin (LOVENOX) injection 40 mg (40 mg Subcutaneous Given 01/06/23 2157)  acetaminophen (TYLENOL) tablet 650 mg (has no administration in time range)    Or  acetaminophen (TYLENOL) suppository 650 mg (has no administration in time range)  oxyCODONE (Oxy IR/ROXICODONE) immediate release tablet 5 mg (has no administration in time range)  ondansetron (ZOFRAN) tablet 4 mg (has no administration in time range)    Or  ondansetron (ZOFRAN) injection 4 mg (has no administration in time range)  metoprolol tartrate (LOPRESSOR) tablet 25 mg (has no administration in time range)  cefTRIAXone (ROCEPHIN) 2 g in sodium chloride 0.9 % 100 mL IVPB (has no administration in time range)  ipratropium-albuterol (DUONEB) 0.5-2.5 (3) MG/3ML nebulizer solution 3 mL (has no administration in time range)  budesonide (PULMICORT) nebulizer solution 0.25 mg (has no administration in time range)  arformoterol (BROVANA) nebulizer solution 15 mcg (has no administration in time range)  predniSONE (DELTASONE) tablet 50 mg (has no administration in time range)  methylPREDNISolone sodium succinate (SOLU-MEDROL) 125 mg/2 mL injection 125 mg (125 mg Intravenous Given 01/06/23 1552)  azithromycin  (ZITHROMAX) 500 mg in sodium chloride 0.9 % 250 mL IVPB ( Intravenous Stopped 01/06/23 1725)  ipratropium-albuterol (DUONEB) 0.5-2.5 (3) MG/3ML nebulizer solution 3 mL (3 mLs Nebulization Given 01/06/23 1547)  iohexol (OMNIPAQUE) 350 MG/ML injection 75 mL (75 mLs Intravenous Contrast Given 01/06/23 1609)  ipratropium-albuterol (DUONEB) 0.5-2.5 (3) MG/3ML nebulizer solution 3 mL (3 mLs Nebulization Given 01/06/23 1651)  diltiazem (CARDIZEM) injection 10 mg (10 mg Intravenous Bolus 01/06/23 1814)    ED Course/ Medical Decision Making/ A&P                                 Medical Decision Making 67 year old male with past medical history of tobacco abuse and hypertension presenting to the emergency department today with productive cough and dyspnea on exertion.  I will further evaluate the patient here with basic labs as well as an EKG and a CT angiogram which will further evaluate for pulmonary edema, pulmonary infiltrates, pneumothorax, and pulmonary embolism.  I will go ahead and order this since the patient is  already scheduled for a CT scan in January.  I will give the patient DuoNeb's here as well as Solu-Medrol and azithromycin as I suspect he likely has undiagnosed COPD.  I will reevaluate for ultimate disposition.  The patient has had COVID testing done as an outpatient so we will defer this at this time.  The patient's pulse ox did drop after walking to the bathroom down to 80%.  Her CT angiogram does show findings concerning for metastatic disease of cancer of unknown origin.  He did go into atrial fibrillation with RVR.  The patient is given diltiazem bolus and infusion with improvement.  He remained stable here.  Calls placed hospital service for admission.  CRITICAL CARE Performed by: Durwin Glaze   Total critical care time: 40 minutes  Critical care time was exclusive of separately billable procedures and treating other patients.  Critical care was necessary to treat or prevent  imminent or life-threatening deterioration.  Critical care was time spent personally by me on the following activities: development of treatment plan with patient and/or surrogate as well as nursing, discussions with consultants, evaluation of patient's response to treatment, examination of patient, obtaining history from patient or surrogate, ordering and performing treatments and interventions, ordering and review of laboratory studies, ordering and review of radiographic studies, pulse oximetry and re-evaluation of patient's condition.    Amount and/or Complexity of Data Reviewed Labs: ordered. Radiology: ordered.  Risk Prescription drug management. Decision regarding hospitalization.           Final Clinical Impression(s) / ED Diagnoses Final diagnoses:  Hypoxia  Lung mass  Atrial fibrillation with RVR (HCC)    Rx / DC Orders ED Discharge Orders     None         Durwin Glaze, MD 01/06/23 2306

## 2023-01-06 NOTE — ED Notes (Signed)
Transport to ct

## 2023-01-06 NOTE — H&P (Addendum)
History and Physical    BENN KLEMANN UXL:[id number] DOB: 1955/10/19 DOA: 01/06/2023  PCP: Etta Grandchild, MD   Chief Complaint: sob  HPI: Scott Lindsey is a 67 y.o. male with medical history significant of tobacco usage, hyperlipidemia who presents emergency department with cough and shortness of breath.  Patient has been having ongoing congestion for last several weeks.  He developed a productive cough and subjective hypoxia so he presented to the ER for further assessment.  He has a longstanding smoking history.  On arrival to emergency department he was afebrile hemodynamically stable.  Labs were obtained which demonstrated COVID and flu negative, sodium 132, creatinine 1.3 at baseline, calcium 10.4, hemoglobin 10.1, troponin within normal limits.  Patient underwent CT pulmonary embolism study which demonstrated no evidence of PE however hilar mass concerning for metastatic disease with circumferential effacement of the trachea and SVC.  There is also a consolidation of the right middle lobe concerning for malignancy.  Patient was admitted for further workup.  On admission he was treated for COPD exacerbation.  He endorses a 20 pound weight loss over the last several months.  He endorses night sweats.   Review of Systems: Review of Systems  Constitutional:  Negative for chills and fever.  HENT: Negative.    Eyes: Negative.   Respiratory:  Positive for cough, sputum production, shortness of breath and wheezing.   Cardiovascular: Negative.   Gastrointestinal: Negative.   Genitourinary: Negative.   Musculoskeletal: Negative.   Skin: Negative.   Neurological: Negative.   Endo/Heme/Allergies: Negative.   Psychiatric/Behavioral: Negative.    All other systems reviewed and are negative.    As per HPI otherwise 10 point review of systems negative.   No Known Allergies  Past Medical History:  Diagnosis Date   Back pain     Past Surgical History:  Procedure Laterality Date    MULTIPLE TOOTH EXTRACTIONS     ROBOT ASSISTED LAPAROSCOPIC RADICAL PROSTATECTOMY  12/13/2010   Procedure: ROBOTIC ASSISTED LAPAROSCOPIC RADICAL PROSTATECTOMY;  Surgeon: Valetta Fuller, MD;  Location: [facility];  Service: Urology;  Laterality: N/A;  with Bilateral Pelivic Lymph Node Dissection     reports that he has been smoking cigarettes. He has a 12.5 pack-year smoking history. He has never used smokeless tobacco. He reports that he does not currently use alcohol after a past usage of about 10.0 standard drinks of alcohol per week. He reports that he does not use drugs.  Family History  Problem Relation Age of Onset   Prostate cancer Brother     Prior to Admission medications   Medication Sig Start Date End Date Taking? Authorizing Provider  albuterol (VENTOLIN HFA) 108 (90 Base) MCG/ACT inhaler Inhale 2 puffs into the lungs every 6 (six) hours as needed for wheezing or shortness of breath. 10/12/22  Yes Corwin Levins, MD  atorvastatin (LIPITOR) 20 MG tablet Take 1 tablet (20 mg total) by mouth daily. 12/11/22  Yes Lennette Bihari, MD  cloNIDine (CATAPRES) 0.1 MG tablet TAKE 1 TABLET BY MOUTH THREE TIMES DAILY Patient taking differently: Take 0.1 mg by mouth See admin instructions. Take 0.1 mg by mouth one to two times a day 07/17/22  Yes Little Ishikawa, MD  folic acid (FOLVITE) 1 MG tablet Take 1 tablet by mouth once daily 10/20/22  Yes Etta Grandchild, MD  losartan (COZAAR) 100 MG tablet Take 1 tablet by mouth once daily Patient taking differently: Take 100 mg by mouth daily. 12/11/22  Yes  Lennette Bihari, MD  magnesium oxide (MAG-OX) 400 MG tablet Take 1 tablet (400 mg total) by mouth daily. Patient taking differently: Take 100 mg by mouth in the morning, at noon, in the evening, and at bedtime. 12/25/22  Yes Little Ishikawa, MD  metoprolol succinate (TOPROL-XL) 25 MG 24 hr tablet Take 1 tablet by mouth once daily 07/17/22  Yes Little Ishikawa, MD  multivitamin (ONE-A-DAY  MEN'S) TABS tablet Take 1 tablet by mouth daily with breakfast.   Yes [provider]  thiamine (VITAMIN B1) 100 MG tablet Take 1 tablet (100 mg total) by mouth daily. Patient not taking: Reported on 01/06/2023 01/25/22   Etta Grandchild, MD    Physical Exam: Vitals:   01/06/23 1900 01/06/23 1913 01/06/23 2024 01/06/23 2027  BP:  120/73 130/70   Pulse: 67 79 (!) 44   Resp: 18 19 20    Temp:  98 F (36.7 C) 97.8 F (36.6 C)   TempSrc:   Oral   SpO2: 94% 95% 94%   Weight:    60.1 kg   Physical Exam Constitutional:      Appearance: He is normal weight.  HENT:     Head: Normocephalic.     Right Ear: Tympanic membrane normal.     Left Ear: Tympanic membrane normal.     Nose: Nose normal.     Mouth/Throat:     Mouth: Mucous membranes are moist.  Eyes:     Conjunctiva/sclera: Conjunctivae normal.     Pupils: Pupils are equal, round, and reactive to light.  Cardiovascular:     Rate and Rhythm: Normal rate and regular rhythm.  Pulmonary:     Effort: Respiratory distress present.     Breath sounds: No stridor. Wheezing present. No rhonchi.  Abdominal:     General: Abdomen is flat. Bowel sounds are normal.  Musculoskeletal:        General: Normal range of motion.  Skin:    General: Skin is warm.     Capillary Refill: Capillary refill takes less than 2 seconds.  Neurological:     General: No focal deficit present.     Mental Status: He is alert.  Psychiatric:        Mood and Affect: Mood normal.        Labs on Admission: I have personally reviewed the patients's labs and imaging studies.  Assessment/Plan Principal Problem:   COPD (chronic obstructive pulmonary disease) (HCC) Active Problems:   COPD exacerbation (HCC)   # Acute hypoxic respiratory failure secondary to COPD exacerbation # Likely metastatic lung cancer -patient has wheezing and productive cough - Possible consolidations in the lungs -Significant smoking history - Also has mass encasement of  the trachea and SVC  Plan: DuoNebs Pulmicort Brovana Ceftriaxone and azithromycin 5 days of prednisone Consult oncology in morning Obtain CT abd/pelvis for staging  # Hyperlipidemia-continue Lipitor  # Hypertension-continue clonidine, losartan, metoprolol  # Atrial fibrillation-patient noted to be in A-fib in the emergency department was started on diltiazem.  Patient spontaneously converted.  Will continue metoprolol.  Will not initiate anticoagulation if patient due to possible need for biopsy  # Chronic anemia- CTM #Hypercalcemia-likely related to malignancy #hyponatruemia- CTM, hold fluids given resp status  Admission status: Inpatient Progressive  Certification: The appropriate patient status for this patient is INPATIENT. Inpatient status is judged to be reasonable and necessary in order to provide the required intensity of service to ensure the patient's safety. The patient's presenting symptoms, physical exam  findings, and initial radiographic and laboratory data in the context of their chronic comorbidities is felt to place them at high risk for further clinical deterioration. Furthermore, it is not anticipated that the patient will be medically stable for discharge from the hospital within 2 midnights of admission.   * I certify that at the point of admission it is my clinical judgment that the patient will require inpatient hospital care spanning beyond 2 midnights from the point of admission due to high intensity of service, high risk for further deterioration and high frequency of surveillance required.Alan Mulder MD [facility] If 7PM-7AM, please contact night-coverage [email or web address]  01/06/2023, 10:44 PM

## 2023-01-06 NOTE — ED Triage Notes (Signed)
Cough, congestion, runny nose  Started "a while ago" ( a couple of months) Reports being winded with short walks Hx : "lung problem"  "We can't wait until Jan 3rd for that ct scan"

## 2023-01-06 NOTE — Plan of Care (Signed)
Patient with history of tobacco use presents emerged part with cough and shortness of breath.  Longstanding smoking history.  Found to likely have COPD exacerbation.  Remaining workup is significant for A-fib with RVR for which emergency department is planning on starting diltiazem.  Patient had CTA pulmonary embolism study which showed concern for metastatic disease.  Will admit patient to [facility] for management of COPD exacerbation and possible further oncologic evaluation.

## 2023-01-06 NOTE — ED Notes (Signed)
Pt placed on the heart monitor and noted to be in rapid afib (130's). MD Rhae Hammock made aware

## 2023-01-06 NOTE — ED Notes (Signed)
[place] with  cl called for transport 

## 2023-01-07 ENCOUNTER — Inpatient Hospital Stay (HOSPITAL_COMMUNITY): Payer: Medicare Other

## 2023-01-07 ENCOUNTER — Encounter (HOSPITAL_COMMUNITY): Payer: Self-pay | Admitting: Internal Medicine

## 2023-01-07 ENCOUNTER — Telehealth: Payer: Self-pay

## 2023-01-07 DIAGNOSIS — I4891 Unspecified atrial fibrillation: Secondary | ICD-10-CM | POA: Diagnosis not present

## 2023-01-07 DIAGNOSIS — R918 Other nonspecific abnormal finding of lung field: Secondary | ICD-10-CM

## 2023-01-07 DIAGNOSIS — R590 Localized enlarged lymph nodes: Secondary | ICD-10-CM | POA: Diagnosis not present

## 2023-01-07 DIAGNOSIS — J441 Chronic obstructive pulmonary disease with (acute) exacerbation: Secondary | ICD-10-CM | POA: Diagnosis not present

## 2023-01-07 LAB — BASIC METABOLIC PANEL
Anion gap: 12 (ref 5–15)
BUN: 23 mg/dL (ref 8–23)
CO2: 19 mmol/L — ABNORMAL LOW (ref 22–32)
Calcium: 9.6 mg/dL (ref 8.9–10.3)
Chloride: 98 mmol/L (ref 98–111)
Creatinine, Ser: 1.29 mg/dL — ABNORMAL HIGH (ref 0.61–1.24)
GFR, Estimated: >60 mL/min (ref >60)
Glucose, Bld: 281 mg/dL — ABNORMAL HIGH (ref 70–99)
Potassium: 4.4 mmol/L (ref 3.5–5.1)
Sodium: 129 mmol/L — ABNORMAL LOW (ref 135–145)

## 2023-01-07 LAB — CBC
HCT: 28.9 % — ABNORMAL LOW (ref 39.0–52.0)
Hemoglobin: 9 g/dL — ABNORMAL LOW (ref 13.0–17.0)
MCH: 30.3 pg (ref 26.0–34.0)
MCHC: 31.1 g/dL (ref 30.0–36.0)
MCV: 97.3 fL (ref 80.0–100.0)
Platelets: 145 10*3/uL — ABNORMAL LOW (ref 150–400)
RBC: 2.97 MIL/uL — ABNORMAL LOW (ref 4.22–5.81)
RDW: 15.1 % (ref 11.5–15.5)
WBC: 5.4 10*3/uL (ref 4.0–10.5)
nRBC: 0 % (ref 0.0–0.2)

## 2023-01-07 MED ORDER — GUAIFENESIN-DM 100-10 MG/5ML PO SYRP: 5.0000 mL | ORAL_SOLUTION | ORAL | Status: DC | PRN

## 2023-01-07 MED ORDER — ENSURE ENLIVE PO LIQD
237.0000 mL | Freq: Two times a day (BID) | ORAL | Status: DC
  Administered 2023-01-07 – 2023-01-10 (×4): 237 mL via ORAL

## 2023-01-07 MED ORDER — IOHEXOL 9 MG/ML PO SOLN
500.0000 mL | ORAL | Status: AC
  Administered 2023-01-07 (×2): 500 mL via ORAL

## 2023-01-07 MED ORDER — IOHEXOL 300 MG/ML  SOLN
100.0000 mL | Freq: Once | INTRAMUSCULAR | Status: AC | PRN
  Administered 2023-01-07: 100 mL via INTRAVENOUS

## 2023-01-07 MED ORDER — IPRATROPIUM-ALBUTEROL 0.5-2.5 (3) MG/3ML IN SOLN
3.0000 mL | Freq: Two times a day (BID) | RESPIRATORY_TRACT | Status: DC
  Administered 2023-01-08 – 2023-01-12 (×9): 3 mL via RESPIRATORY_TRACT
  Filled 2023-01-07 (×11): qty 3

## 2023-01-07 NOTE — Plan of Care (Signed)

## 2023-01-07 NOTE — Telephone Encounter (Signed)
Copied from CRM [id number]. Topic: Clinical - Medical Advice >> Jan 07, 2023 11:03 AM Isabell A wrote:  Reason for CRM: Patient went to Emergency care at [facility], completed a CT of his lung & a mass was found. Spouse wanted to inform Dr.Jones.

## 2023-01-07 NOTE — Consult Note (Signed)
NAME:  Scott Lindsey, MRN:  [id number], DOB:  09/07/1955, LOS: 1 ADMISSION DATE:  01/06/2023, CONSULTATION DATE:  01/07/23 REFERRING MD:  Mauro Kaufmann, MD CHIEF COMPLAINT:  Lung mass   History of Present Illness:  67 year old male active tobacco abuse (0.5 ppd x 30 years), alcohol abuse (1 pint/week), COPD, pulmonary fibrosis, recent weight loss (20 lbs in the last 6 months) who presents for worsening cough, shortness of breath and congestion. He is a poor historian at baseline and difficult to elicit history. Chart reviewed for history.  He was treated for COPD in October 10/2022 and CXR commented on thickening of right paratracheal stripe that had increased in size compared to 01/25/22 CXR concerning for lymphadenopathy. CT chest was ordered and pulmonary referral placed. However when he was seen by PCP on 12/21/22  for COPD exacerbation, patient still had not had CT completed or scheduled with Pulmonary due to not answering phone calls to set up appointment.  CTA neg for PE with bulky mediastinal and hilar adenopathy with mass effect on trachea, SVC and pulmonary arteries and RML consolidation/nodule and additional nodules including RLL nodule. Findings concerning for malignancy. CT A/P with 31 x 26 mm low density hepatic lesion.  He reports he has weight loss in the last 6-12 months up to 20 lbs and has chronic shortness of breath and wheezing with activity including with short distances on his driveway. Has productive cough as well. Still smoking.  Pertinent  Medical History  COPD, pulmonary fibrosis, HTN, hx alcoholic pancreatitis, hx alcohol abuse, HLD, hx prostate cancer  Significant Hospital Events: Including procedures, antibiotic start and stop dates in addition to other pertinent events     Interim History / Subjective:  As above  Objective   Blood pressure 120/60, pulse 93, temperature 97.8 F (36.6 C), temperature source Oral, resp. rate 17, height 5\' 8"  (1.727 m), weight 60.1  kg, SpO2 94%.        Intake/Output Summary (Last 24 hours) at 01/07/2023 1632 Last data filed at 01/07/2023 1337 Gross per 24 hour  Intake 767.97 ml  Output 900 ml  Net -132.03 ml   Filed Weights   01/06/23 1815 01/06/23 2027  Weight: 59.9 kg 60.1 kg   Physical Exam: General: Well-appearing, no acute distress HENT: [place], AT Eyes: EOMI, no scleral icterus Respiratory: Diminished airway entry with bibasilar crackles to auscultation.  No wheezing  Cardiovascular: RRR, -M/R/G, no JVD Extremities:-Edema,-tenderness Neuro: AAO x4, CNII-XII grossly intact Psych: Normal mood, normal affect   Resolved Hospital Problem list   N/A  Assessment & Plan:   Bulky and circumferential mediastinal and hilar lymphadenopathy with mass effect including luminal effacement of trachea, SVC and pulmonary arteries RML and RLL satellite nodules Hepatic lesions, concerning for mets CAP Discussed with primary team and Oncology regarding evaluation for biopsy via bronchoscopy. Discussed risks and benefits of bronchoscopy with anesthesia in setting of above as well as his chronic lung disease. He also is being currently treated for pneumonia. Potential risk of respiratory failure post procedure requiring ventilator support. Patient would prefer alternative biopsy site if available.  -IR evaluation pending for candidacy for liver biopsy  CAP COPD Pulmonary fibrosis - chronic changes noted on lung bases on CT A/P since 2022 Not on maintenance inhalers -Agree with Pulmicort, Brovana and Duonebs -Agree with abx and prednisone -Will need outpatient Pulmonary follow-up for chronic lung disease  Pulmonary will continue to follow Best Practice (right click and "Reselect all SmartList Selections" daily)  Diet/type: Regular consistency (see orders) DVT prophylaxis LMWH Pressure ulcer(s): N/A GI prophylaxis: N/A Lines: N/A Foley:  N/A Code Status:  full code Last date of multidisciplinary goals of care  discussion [ per primary]  Labs   CBC: Recent Labs  Lab 01/06/23 1347 01/07/23 0343  WBC 9.4 5.4  HGB 10.1* 9.0*  HCT 30.2* 28.9*  MCV 92.9 97.3  PLT 161 145*    Basic Metabolic Panel: Recent Labs  Lab 01/06/23 1347 01/07/23 0343  NA 132* 129*  K 4.2 4.4  CL 98 98  CO2 25 19*  GLUCOSE 119* 281*  BUN 20 23  CREATININE 1.33* 1.29*  CALCIUM 10.4* 9.6   GFR: Estimated Creatinine Clearance: 45.9 mL/min (A) (by C-G formula based on SCr of 1.29 mg/dL (H)). Recent Labs  Lab 01/06/23 1347 01/07/23 0343  WBC 9.4 5.4    Liver Function Tests: No results for input(s): "AST", "ALT", "ALKPHOS", "BILITOT", "PROT", "ALBUMIN" in the last 168 hours. No results for input(s): "LIPASE", "AMYLASE" in the last 168 hours. No results for input(s): "AMMONIA" in the last 168 hours.  ABG No results found for: "PHART", "PCO2ART", "PO2ART", "HCO3", "TCO2", "ACIDBASEDEF", "O2SAT"   Coagulation Profile: No results for input(s): "INR", "PROTIME" in the last 168 hours.  Cardiac Enzymes: No results for input(s): "CKTOTAL", "CKMB", "CKMBINDEX", "TROPONINI" in the last 168 hours.  HbA1C: No results found for: "HGBA1C"  CBG: No results for input(s): "GLUCAP" in the last 168 hours.  Review of Systems:   Review of Systems  Constitutional:  Positive for malaise/fatigue and weight loss. Negative for chills, diaphoresis and fever.  HENT:  Negative for congestion.   Respiratory:  Positive for cough, shortness of breath and wheezing. Negative for hemoptysis and sputum production.   Cardiovascular:  Negative for chest pain, palpitations and leg swelling.     Past Medical History:  He,  has a past medical history of Back pain.   Surgical History:   Past Surgical History:  Procedure Laterality Date   MULTIPLE TOOTH EXTRACTIONS     ROBOT ASSISTED LAPAROSCOPIC RADICAL PROSTATECTOMY  12/13/2010   Procedure: ROBOTIC ASSISTED LAPAROSCOPIC RADICAL PROSTATECTOMY;  Surgeon: Valetta Fuller, MD;   Location: [facility];  Service: Urology;  Laterality: N/A;  with Bilateral Pelivic Lymph Node Dissection     Social History:   reports that he has been smoking cigarettes. He has a 12.5 pack-year smoking history. He has never used smokeless tobacco. He reports that he does not currently use alcohol after a past usage of about 10.0 standard drinks of alcohol per week. He reports that he does not use drugs.   Family History:  His family history includes Prostate cancer in his brother.   Allergies No Known Allergies   Home Medications  Prior to Admission medications   Medication Sig Start Date End Date Taking? Authorizing Provider  albuterol (VENTOLIN HFA) 108 (90 Base) MCG/ACT inhaler Inhale 2 puffs into the lungs every 6 (six) hours as needed for wheezing or shortness of breath. 10/12/22  Yes Corwin Levins, MD  atorvastatin (LIPITOR) 20 MG tablet Take 1 tablet (20 mg total) by mouth daily. 12/11/22  Yes Lennette Bihari, MD  cloNIDine (CATAPRES) 0.1 MG tablet TAKE 1 TABLET BY MOUTH THREE TIMES DAILY Patient taking differently: Take 0.1 mg by mouth See admin instructions. Take 0.1 mg by mouth one to two times a day 07/17/22  Yes Little Ishikawa, MD  folic acid (FOLVITE) 1 MG tablet Take 1 tablet by mouth once  daily 10/20/22  Yes Etta Grandchild, MD  losartan (COZAAR) 100 MG tablet Take 1 tablet by mouth once daily Patient taking differently: Take 100 mg by mouth daily. 12/11/22  Yes Lennette Bihari, MD  magnesium oxide (MAG-OX) 400 MG tablet Take 1 tablet (400 mg total) by mouth daily. Patient taking differently: Take 100 mg by mouth in the morning, at noon, in the evening, and at bedtime. 12/25/22  Yes Little Ishikawa, MD  metoprolol succinate (TOPROL-XL) 25 MG 24 hr tablet Take 1 tablet by mouth once daily 07/17/22  Yes Little Ishikawa, MD  multivitamin (ONE-A-DAY MEN'S) TABS tablet Take 1 tablet by mouth daily with breakfast.   Yes [provider]  thiamine (VITAMIN  B1) 100 MG tablet Take 1 tablet (100 mg total) by mouth daily. Patient not taking: Reported on 01/06/2023 01/25/22   Etta Grandchild, MD     Critical care time:  N/A    Care Time: 65 min  Mechele Collin, M.D. [facility] Pulmonary/Critical Care Medicine 01/07/2023 4:32 PM   Please see Amion for pager number to reach on-call Pulmonary and Critical Care Team.

## 2023-01-07 NOTE — Consult Note (Addendum)
[facility] CONSULT NOTE  Patient Care Team: Etta Grandchild, MD as PCP - General (Internal Medicine) Little Ishikawa, MD as PCP - Cardiology (Cardiology)  CHIEF COMPLAINTS/PURPOSE OF CONSULTATION:  Lung mass with lymph nodes encasing trachea, pulmonary arteries, SVC  REFERRING PHYSICIAN: Dr. Sharl Ma  HISTORY OF PRESENTING ILLNESS:  Scott Lindsey 67 y.o. male presented to ED with complaints of cough and worsening shortness of breath.  Workup was done in the ED ED including labs and CT scan.  CT angio chest showed hilar mass which was concerning for metastatic disease with lymph nodes encasing trachea, pulmonary arteries and SVC.  Therefore oncology consult has been requested. Patient is seen examined and assessed today.  He is awake, alert and oriented x 3.  He reports that he went to his PCP in early December at which time an x-ray was done.  Patient's wife is at bedside.   Medical history includes hyperlipidemia.  Patient reports that he has an irregular heartbeat for which he follows with cardiology.  Also with history of hypertension hyperlipidemia. Oncologic history includes a brother with prostate cancer. Surgical history significant for prostatectomy in 2012. Social history is significant for tobacco use, he started smoking as a teenager medically for pack per day and most recently only takes "puffs" which is equivalent to 50+ year history.  Admits to previous alcohol use up to 1 2 shots per day, denies illicit drug use although smokes marijuana in his 81s.  Patient worked at the [employer] for many years and [place]   I have reviewed his chart and materials related to his cancer extensively and collaborated history with the patient. Summary of oncologic history is as follows: Oncology History   No history exists.    ASSESSMENT & PLAN:  1.  Lung mass - CT angio done 01/06/2023 shows bulky nodal conglomerate into the right hilum with  circumferential luminal effacement of the trachea, SVC, and pulmonary arteries, findings highly suspicious for metastatic disease. - Patient with significant 50+ year history of tobacco use. - No evidence of pulmonary embolism -Patient will need biopsy for definitive diagnosis - Medical oncology following  2.  Anemia, normocytic -Hemoglobin 9.0 today.  Appears lower than baseline of approximately 12. - May be secondary to renal insufficiency plus malignancy - Transfuse PRBC for Hgb less than 7.0.  No indication to transfuse at this time. - Monitor CBC with differential closely  3.  Hypercalcemia - May be secondary to malignancy - Calcium with slight decline to 9.6 today from 10.4 - Consider bisphosphonate if calcium continue to rise or confusion noted. - Monitor CMP closely  4.  Acute hypoxic respiratory failure - Patient with cough and shortness of breath - Respiratory therapy as needed - Supportive care with O2 as needed - Monitor closely  Orders Placed This Encounter  Procedures   Resp panel by RT-PCR (RSV, Flu A&B, Covid) Anterior Nasal Swab    Standing Status:   Standing    Number of Occurrences:   1   CT Angio Chest PE W and/or Wo Contrast    Standing Status:   Standing    Number of Occurrences:   1    Does the patient have a contrast media/X-ray dye allergy?:   No    If indicated for the ordered procedure, I authorize the administration of contrast media per Radiology protocol:   Yes    Radiology Contrast Protocol - do NOT remove file path:   [email or web address]   CT ABDOMEN PELVIS W CONTRAST    Standing Status:   Standing    Number of Occurrences:   1    Does the patient have a contrast media/X-ray dye allergy?:   No    If indicated for the ordered procedure, I authorize the administration of contrast media per Radiology protocol:   Yes    If indicated for the ordered procedure, I authorize the administration of oral contrast media  per Radiology protocol:   Yes   Basic metabolic panel    Standing Status:   Standing    Number of Occurrences:   1   CBC    Standing Status:   Standing    Number of Occurrences:   1   Basic metabolic panel    Standing Status:   Standing    Number of Occurrences:   1   CBC    Standing Status:   Standing    Number of Occurrences:   1   Diet regular Room service appropriate? Yes; Fluid consistency: Thin    Standing Status:   Standing    Number of Occurrences:   1    Room service appropriate?:   Yes    Fluid consistency::   Thin   Document Height and Actual Weight    Use scales to weigh patient, not stated or estimated weight.    Standing Status:   Standing    Number of Occurrences:   1   Cardiac Monitoring Continuous x 48 hours Indications for use: Syncope of unknown etiology    Standing Status:   Standing    Number of Occurrences:   1    Indications for use::   Syncope of unknown etiology   Vital signs    Standing Status:   Standing    Number of Occurrences:   1   Notify physician (specify)    Standing Status:   Standing    Number of Occurrences:   20    Notify Physician:   for pulse less than 55 or greater than 120    Notify Physician:   for respiratory rate less than 12 or greater than 25    Notify Physician:   for temperature greater than 100.5 F    Notify Physician:   for urinary output less than 30 mL/hr for four hours    Notify Physician:   for systolic BP less than 90 or greater than 160, diastolic BP less than 60 or greater than 100    Notify Physician:   for new hypoxia w/ oxygen saturations < 88%   Mobility Protocol: No Restrictions    RN to initiate protocols based on patient's level of care    Standing Status:   Standing    Number of Occurrences:   1   Refer to Sidebar Report Refer to ICU, Med-Surg, Progressive, and Step-Down Mobility Protocol Sidebars    Refer to ICU, Med-Surg, Progressive, and Step-Down Mobility Protocol Sidebars    Standing Status:   Standing     Number of Occurrences:   1   Do not place and if present remove PureWick    Standing Status:   Standing    Number of Occurrences:   1   Initiate Oral Care Protocol    Standing Status:   Standing    Number of Occurrences:   1   Initiate Carrier Fluid Protocol    Standing Status:   Standing    Number of Occurrences:   1  RN may order General Admission PRN Orders utilizing "General Admission PRN medications" (through manage orders) for the following patient needs: allergy symptoms (Claritin), cold sores (Carmex), cough (Robitussin DM), eye irritation (Liquifilm Tears), hemorrhoids (Tucks), indigestion (Maalox), minor skin irritation (Hydrocortisone Cream), muscle pain Romeo Apple Gay), nose irritation (saline nasal spray) and sore throat (Chloraseptic spray).    Standing Status:   Standing    Number of Occurrences:   [phone]   SCDs    Standing Status:   Standing    Number of Occurrences:   1    Laterality:   Bilateral   Patient has an active order for admit to inpatient/place in observation    Standing Status:   Standing    Number of Occurrences:   1   Cardiac Monitoring Continuous x 48 hours Indications for use: Syncope of unknown etiology    Standing Status:   Standing    Number of Occurrences:   1    Indications for use::   Syncope of unknown etiology   Reason for not ordering HIV testing    Standing Status:   Standing    Number of Occurrences:   1    Reason::   Patient tested within last 12 months   Full code    Standing Status:   Standing    Number of Occurrences:   1    By::   Consent: discussion documented in EHR   Consult to hospitalist    Standing Status:   Standing    Number of Occurrences:   1    Place call to::   [facility]    Reason for Consult:   Admit   Oxygen therapy Mode or (Route): Nasal cannula; Liters Per Minute: 2; Keep O2 saturation between: greater than 92 %    Standing Status:   Standing    Number of Occurrences:   1    Mode or (Route):   Nasal cannula     Liters Per Minute:   2    Keep O2 saturation between:   greater than 92 %   ED EKG    Standing Status:   Standing    Number of Occurrences:   1    Reason for Exam:   Shortness of breath   EKG 12-Lead    Standing Status:   Standing    Number of Occurrences:   1   EKG    Standing Status:   Standing    Number of Occurrences:   1   EKG    Standing Status:   Standing    Number of Occurrences:   1   EKG 12-Lead    Standing Status:   Standing    Number of Occurrences:   1   Admit to Inpatient (patient's expected length of stay will be greater than 2 midnights or inpatient only procedure)    Standing Status:   Standing    Number of Occurrences:   1    Hospital Area:   [facility] [[id number]]    Level of Care:   Progressive [102]    Admit to Progressive based on following criteria:   CARDIOVASCULAR & THORACIC of moderate stability with acute coronary syndrome symptoms/low risk myocardial infarction/hypertensive urgency/arrhythmias/heart failure potentially compromising stability and stable post cardiovascular intervention patients.    May admit patient to [facility] or [facility] if equivalent level of care is available::   Yes    Interfacility transfer:   Yes    Covid Evaluation:  Asymptomatic - no recent exposure (last 10 days) testing not required    Diagnosis:   COPD exacerbation [facility]) [[id number]]    Admitting Physician:   Alan Mulder [[id number]]    Attending Physician:   Alan Mulder [[id number]]    Certification::   I certify this patient will need inpatient services for at least 2 midnights     MEDICAL HISTORY:  Past Medical History:  Diagnosis Date   Back pain     SURGICAL HISTORY: Past Surgical History:  Procedure Laterality Date   MULTIPLE TOOTH EXTRACTIONS     ROBOT ASSISTED LAPAROSCOPIC RADICAL PROSTATECTOMY  12/13/2010   Procedure: ROBOTIC ASSISTED LAPAROSCOPIC RADICAL PROSTATECTOMY;  Surgeon: Valetta Fuller, MD;  Location: [facility];  Service:  Urology;  Laterality: N/A;  with Bilateral Pelivic Lymph Node Dissection    SOCIAL HISTORY: Social History   Socioeconomic History   Marital status: Married    Spouse name: Not on file   Number of children: Not on file   Years of education: Not on file   Highest education level: Not on file  Occupational History   Not on file  Tobacco Use   Smoking status: Every Day    Current packs/day: 0.50    Average packs/day: 0.5 packs/day for 25.0 years (12.5 ttl pk-yrs)    Types: Cigarettes   Smokeless tobacco: Never  Substance and Sexual Activity   Alcohol use: Not Currently    Alcohol/week: 10.0 standard drinks of alcohol    Types: 10 Glasses of wine per week    Comment: occasional   Drug use: No   Sexual activity: Yes    Partners: Female  Other Topics Concern   Not on file  Social History Narrative   Not on file   Social Drivers of Health   Financial Resource Strain: Not on file  Food Insecurity: No Food Insecurity (01/06/2023)   Hunger Vital Sign    Worried About Running Out of Food in the Last Year: Never true    Ran Out of Food in the Last Year: Never true  Transportation Needs: No Transportation Needs (01/06/2023)   PRAPARE - Administrator, Civil Service (Medical): No    Lack of Transportation (Non-Medical): No  Physical Activity: Not on file  Stress: Not on file  Social Connections: Moderately Integrated (01/07/2023)   Social Connection and Isolation Panel [NHANES]    Frequency of Communication with Friends and Family: More than three times a week    Frequency of Social Gatherings with Friends and Family: Three times a week    Attends Religious Services: 1 to 4 times per year    Active Member of Clubs or Organizations: No    Attends Banker Meetings: Never    Marital Status: Married  Catering manager Violence: Not At Risk (01/06/2023)   Humiliation, Afraid, Rape, and Kick questionnaire    Fear of Current or Ex-Partner: No    Emotionally  Abused: No    Physically Abused: No    Sexually Abused: No    FAMILY HISTORY: Family History  Problem Relation Age of Onset   Prostate cancer Brother     REVIEW OF SYSTEMS:   Constitutional: Denies fevers, chills or abnormal night sweats Eyes: Denies blurriness of vision, double vision or watery eyes Ears, nose, mouth, throat, and face: Denies mucositis or sore throat Respiratory: +cough, dyspnea or wheezes Cardiovascular: +irregular heartbeat, chest discomfort or lower extremity swelling Gastrointestinal: Denies nausea, heartburn or change in bowel habits Skin: Denies  abnormal skin rashes Lymphatics: Denies new lymphadenopathy or easy bruising Neurological: Denies numbness, tingling or new weaknesses Behavioral/Psych: Mood is stable, no new changes  All other systems were reviewed with the patient and are negative.  PHYSICAL EXAMINATION: ECOG PERFORMANCE STATUS: 1 - Symptomatic but completely ambulatory  Vitals:   01/07/23 0822 01/07/23 1014  BP: 129/65 125/64  Pulse: (!) 56 (!) 58  Resp: 18   Temp: (!) 97.5 F (36.4 C)   SpO2: (!) 68%    Filed Weights   01/06/23 1815 01/06/23 2027  Weight: 132 lb (59.9 kg) 132 lb 8 oz (60.1 kg)    GENERAL: alert, no distress and comfortable SKIN: skin color, texture, turgor are normal, no rashes or significant lesions EYES: normal, conjunctiva are pink and non-injected, sclera clear OROPHARYNX: no exudate, no erythema and lips, buccal mucosa, and tongue normal  NECK: supple, thyroid normal size, non-tender, without nodularity LYMPH: no palpable lymphadenopathy in the cervical, axillary or inguinal LUNGS: clear to auscultation and percussion with normal breathing effort HEART: regular rate & rhythm and no murmurs and no lower extremity edema ABDOMEN: abdomen soft, non-tender and normal bowel sounds MUSCULOSKELETAL: no cyanosis of digits and no clubbing  PSYCH: alert & oriented x 3 with fluent speech NEURO: no focal motor/sensory  deficits   ALLERGIES:  has no known allergies.  MEDICATIONS:  Current Facility-Administered Medications  Medication Dose Route Frequency Provider Last Rate Last Admin   acetaminophen (TYLENOL) tablet 650 mg  650 mg Oral Q6H PRN Alan Mulder, MD       Or   acetaminophen (TYLENOL) suppository 650 mg  650 mg Rectal Q6H PRN Alan Mulder, MD       arformoterol (BROVANA) nebulizer solution 15 mcg  15 mcg Nebulization BID Alan Mulder, MD   15 mcg at 01/07/23 0912   atorvastatin (LIPITOR) tablet 20 mg  20 mg Oral Daily Dorrell, Molly Maduro, MD   20 mg at 01/07/23 1015   budesonide (PULMICORT) nebulizer solution 0.25 mg  0.25 mg Nebulization BID Alan Mulder, MD   0.25 mg at 01/07/23 0913   cefTRIAXone (ROCEPHIN) 2 g in sodium chloride 0.9 % 100 mL IVPB  2 g Intravenous Q24H Alan Mulder, MD 200 mL/hr at 01/06/23 2344 2 g at 01/06/23 2344   cloNIDine (CATAPRES) tablet 0.1 mg  0.1 mg Oral TID Alan Mulder, MD   0.1 mg at 01/07/23 1014   enoxaparin (LOVENOX) injection 40 mg  40 mg Subcutaneous QHS Alan Mulder, MD   40 mg at 01/06/23 2157   feeding supplement (ENSURE ENLIVE / ENSURE PLUS) liquid 237 mL  237 mL Oral BID BM Alan Mulder, MD   237 mL at 01/07/23 1018   guaiFENesin-dextromethorphan (ROBITUSSIN DM) 100-10 MG/5ML syrup 5 mL  5 mL Oral Q4H PRN Dorrell, Molly Maduro, MD       ipratropium-albuterol (DUONEB) 0.5-2.5 (3) MG/3ML nebulizer solution 3 mL  3 mL Nebulization Q6H Dorrell, Robert, MD   3 mL at 01/07/23 0911   losartan (COZAAR) tablet 100 mg  100 mg Oral Daily Dorrell, Molly Maduro, MD   100 mg at 01/07/23 1015   metoprolol tartrate (LOPRESSOR) tablet 25 mg  25 mg Oral BID Alan Mulder, MD   25 mg at 01/07/23 1014   ondansetron (ZOFRAN) tablet 4 mg  4 mg Oral Q6H PRN Alan Mulder, MD       Or   ondansetron (ZOFRAN) injection 4 mg  4 mg Intravenous Q6H PRN Alan Mulder, MD       oxyCODONE (  Oxy IR/ROXICODONE) immediate release tablet 5 mg  5 mg Oral Q4H PRN Alan Mulder, MD       predniSONE (DELTASONE) tablet 50 mg  50 mg Oral Q breakfast Alan Mulder, MD   50 mg at 01/07/23 1014     LABORATORY DATA:  I have reviewed the data as listed Lab Results  Component Value Date   WBC 5.4 01/07/2023   HGB 9.0 (L) 01/07/2023   HCT 28.9 (L) 01/07/2023   MCV 97.3 01/07/2023   PLT 145 (L) 01/07/2023   Recent Labs    01/25/22 1121 08/02/22 1057 01/06/23 1347 01/07/23 0343  NA 138 136 132* 129*  K 3.8 4.9 4.2 4.4  CL 101 101 98 98  CO2 23 28 25  19*  GLUCOSE 89 109* 119* 281*  BUN 13 12 20 23   CREATININE 1.22 1.10 1.33* 1.29*  CALCIUM 9.8 10.1 10.4* 9.6  GFRNONAA  --   --  58* >60  PROT 9.1* 8.6*  --   --   ALBUMIN 4.3 4.0  --   --   AST 28 20  --   --   ALT 14 11  --   --   ALKPHOS 158* 160*  --   --   BILITOT 0.4 0.5  --   --   BILIDIR 0.1 0.1  --   --     RADIOGRAPHIC STUDIES: I have personally reviewed the radiological images as listed and agreed with the findings in the report. CT Angio Chest PE W and/or Wo Contrast Result Date: 01/06/2023 CLINICAL DATA:  Shortness of breath on exertion EXAM: CT ANGIOGRAPHY CHEST WITH CONTRAST TECHNIQUE: Multidetector CT imaging of the chest was performed using the standard protocol during bolus administration of intravenous contrast. Multiplanar CT image reconstructions and MIPs were obtained to evaluate the vascular anatomy. RADIATION DOSE REDUCTION: This exam was performed according to the departmental dose-optimization program which includes automated exposure control, adjustment of the mA and/or kV according to patient size and/or use of iterative reconstruction technique. CONTRAST:  75mL OMNIPAQUE IOHEXOL 350 MG/ML SOLN COMPARISON:  Chest radiograph dated 10/12/2022, CT abdomen and pelvis dated 04/10/2021 FINDINGS: Cardiovascular: The study is high quality for the evaluation of pulmonary embolism. Effacement of the right main pulmonary artery and most notably the right upper lobar pulmonary artery due  to bulky mediastinal lymphadenopathy. There are no filling defects in the central, lobar, segmental or subsegmental pulmonary artery branches to suggest acute pulmonary embolism. Great vessels are normal in course and caliber. Normal heart size. No significant pericardial fluid/thickening. Coronary artery calcifications and aortic atherosclerosis. Severe luminal narrowing of the SVC. Reflux of contrast material into the hepatic veins, suggesting a degree of right heart dysfunction. Multiple collaterals within the skin and paraspinal region. Mediastinum/Nodes: Imaged thyroid gland without nodules meeting criteria for imaging follow-up by size. Normal esophagus. Bulky nodal conglomerate centered at the level of the carina extending into the right hilum spans approximately 8.1 x 4.8 cm (4:70) and results in mass effect and circumferential luminal effacement of the trachea, SVC, and pulmonary arteries. 19 mm right anterior mediastinal lymph node (4:70). Lungs/Pleura: Marked effacement of the distal trachea and bilateral proximal bronchi, right-greater-than-left due to mediastinal lymphadenopathy. Small volume adherent secretions within the trachea. Irregular consolidation in the right middle lobe with tethering and traction of the right major fissure measures 4.2 x 3.1 cm (6:112). Superior segment right lower lobe nodule measures 2.3 x 2.2 cm (6:93). Additional peripheral nodule in the right middle lobe measures  0.9 x 0.7 cm (6:106). These appear new compared to 04/10/2021. Background findings of pulmonary fibrosis with right-greater-than-left lower lobe predominant honeycombing and traction bronchiectasis. No pneumothorax. No pleural effusion. Upper abdomen: Ill-defined hypodensity centered within segment 4 measures 4.5 x 3.5 cm (4:142), new from 04/10/2021. Musculoskeletal: No acute or abnormal lytic or blastic osseous lesions. Review of the MIP images confirms the above findings. IMPRESSION: 1. No evidence of  pulmonary embolism. 2. Bulky nodal conglomerate centered at the level of the carina extending into the right hilum results in mass effect and circumferential luminal effacement of the trachea, SVC, and pulmonary arteries. Findings are highly suspicious for metastatic disease. 3. Irregular consolidation in the right middle lobe with tethering and traction of the right major fissure measures 4.2 x 3.1 cm, suspicious for malignancy, either primary or metastatic. Additional superior segment right lower lobe nodule and peripheral right middle lobe nodule. These appear new compared to 04/10/2021. 4. Ill-defined hypodensity centered within segment 4 measures 4.5 x 3.5 cm, new from 04/10/2021, suspicious for metastatic disease. 5. Reflux of contrast material into the hepatic veins, suggesting a degree of right heart dysfunction. 6.  Aortic Atherosclerosis (ICD10-I70.0). Electronically Signed   By: Agustin Cree M.D.   On: 01/06/2023 16:36     The total time spent in the appointment was 40 minutes encounter with patients including review of chart and various tests results, discussions about plan of care and coordination of care plan   All questions were answered. The patient knows to call the clinic with any problems, questions or concerns. No barriers to learning was detected.  Dawson Bills, NP 12/30/202411:39 AM  Attending Note  I personally saw the patient, reviewed the chart and examined the patient. The plan of care was discussed with the patient and the admitting team. I agree with the assessment and plan as documented above.  Briefly, this is a pleasant 68 year old male patient with the history of at least 50 pack years of smoking presented with worsening shortness of breath and CT imaging suggesting possible lung primary with metastatic disease in the lymph nodes and liver.  He also appears to have underlying pulmonary fibrosis and COPD.  Discussed with pulmonary team who recommends liver biopsy given the  underlying pulmonary fibrosis and high risk for ventilatory after bronchoscopy.  We have reviewed the imaging findings.  Patient agrees with the biopsy.  Primary team to arrange for IR guided liver biopsy and once we have pathology results available, we will see the patient and review treatment recommendations again.  I have recommended that he stays inpatient until we have the biopsy results available since there is a possibility that this could be small cell lung cancer and may need treatment immediately.  I would also recommend an MRI brain to complete staging.  Thank you very much for the consultation.

## 2023-01-07 NOTE — Telephone Encounter (Signed)
FYI

## 2023-01-07 NOTE — Progress Notes (Signed)
[facility]  PROGRESS NOTE  UNNAMED Scott Lindsey:[id number] DOB: 28-Nov-1955 DOA: 01/06/2023 PCP: Etta Grandchild, MD   Brief HPI:    67 y.o. male with medical history significant of tobacco usage, hyperlipidemia who presents emergency department with cough and shortness of breath.  Patient has been having ongoing congestion for last several weeks.  He developed a productive cough and subjective hypoxia so he presented to the ER for further assessment.  He has a longstanding smoking history. CT pulmonary embolism study which demonstrated no evidence of PE however hilar mass concerning for metastatic disease with circumferential effacement of the trachea and SVC. There is also a consolidation of the right middle lobe concerning for malignancy. Patient was admitted for further workup.     Assessment/Plan:   Lung mass -Concerning for metastatic cancer -CT chest shows hilar mass concerning for metastatic disease with circumferential effacement of the trachea and SVC -There is also consolidation of the right middle lobe concerning for malignancy -Will consult oncology and pulmonology -Likely bronchoscopy versus IR consult for biopsy of lung mass  COPD exacerbation -Continue scheduled Pulmicort, DuoNeb nebulizers -Continue ceftriaxone, prednisone 50 mg daily  Hypertension -Continue clonidine, losartan, metoprolol  Hyperlipidemia -Continue Lipitor  Atrial fibrillation -Patient was found to be in A-fib in the ED -He was started on diltiazem and was spontaneously converted to normal sinus rhythm -Anticoagulation on hold in anticipation for lung biopsy -Continue metoprolol  Acute kidney injury versus CKD stage IIb -Follow creatinine in the hospital  Hyponatremia -Sodium is low at 129 -Likely due to SIADH in setting of malignancy  Medications     arformoterol  15 mcg Nebulization BID   atorvastatin  20 mg Oral Daily   budesonide (PULMICORT) nebulizer solution  0.25 mg  Nebulization BID   cloNIDine  0.1 mg Oral TID   enoxaparin (LOVENOX) injection  40 mg Subcutaneous QHS   feeding supplement  237 mL Oral BID BM   ipratropium-albuterol  3 mL Nebulization Q6H   losartan  100 mg Oral Daily   metoprolol tartrate  25 mg Oral BID   predniSONE  50 mg Oral Q breakfast     Data Reviewed:   CBG:  No results for input(s): "GLUCAP" in the last 168 hours.  SpO2:  (unable to pick up sats, cold fingers. Pt is stable.)    Vitals:   01/07/23 0117 01/07/23 0357 01/07/23 0822 01/07/23 1014  BP: 119/64 (!) 94/57 129/65 125/64  Pulse: 90 80 (!) 56 (!) 58  Resp: 17 20 18    Temp:  (!) 97.4 F (36.3 C) (!) 97.5 F (36.4 C)   TempSrc:  Oral Oral   SpO2:  100% (!) 68%   Weight:      Height:          Data Reviewed:  Basic Metabolic Panel: Recent Labs  Lab 01/06/23 1347 01/07/23 0343  NA 132* 129*  K 4.2 4.4  CL 98 98  CO2 25 19*  GLUCOSE 119* 281*  BUN 20 23  CREATININE 1.33* 1.29*  CALCIUM 10.4* 9.6    CBC: Recent Labs  Lab 01/06/23 1347 01/07/23 0343  WBC 9.4 5.4  HGB 10.1* 9.0*  HCT 30.2* 28.9*  MCV 92.9 97.3  PLT 161 145*    LFT No results for input(s): "AST", "ALT", "ALKPHOS", "BILITOT", "PROT", "ALBUMIN" in the last 168 hours.   Antibiotics: Anti-infectives (From admission, onward)    Start     Dose/Rate Route Frequency Ordered Stop   01/06/23 2300  cefTRIAXone (ROCEPHIN) 2 g in sodium chloride 0.9 % 100 mL IVPB        2 g 200 mL/hr over 30 Minutes Intravenous Every 24 hours 01/06/23 2252     01/06/23 1545  azithromycin (ZITHROMAX) 500 mg in sodium chloride 0.9 % 250 mL IVPB        500 mg 250 mL/hr over 60 Minutes Intravenous  Once 01/06/23 1532 01/06/23 1725        DVT prophylaxis: SCDs  Code Status: Full code  Family Communication: Discussed with patient wife at bedside   CONSULTS    Subjective   Denies shortness of breath, complains of headache.   Objective    Physical  Examination:   General-appears in no acute distress Heart-S1-S2, regular, no murmur auscultated Lungs-clear to auscultation bilaterally, no wheezing or crackles auscultated Abdomen-soft, nontender, no organomegaly Extremities-no edema in the lower extremities Neuro-alert, oriented x3, no focal deficit noted  Status is: Inpatient:             Meredeth Ide   [facility] If 7PM-7AM, please contact night-coverage at [email or web address], Office  [phone]   01/07/2023, 10:40 AM  LOS: 1 day

## 2023-01-08 ENCOUNTER — Inpatient Hospital Stay (HOSPITAL_COMMUNITY): Payer: Medicare Other

## 2023-01-08 DIAGNOSIS — R918 Other nonspecific abnormal finding of lung field: Secondary | ICD-10-CM | POA: Diagnosis not present

## 2023-01-08 DIAGNOSIS — J441 Chronic obstructive pulmonary disease with (acute) exacerbation: Secondary | ICD-10-CM | POA: Diagnosis not present

## 2023-01-08 DIAGNOSIS — I4891 Unspecified atrial fibrillation: Secondary | ICD-10-CM | POA: Diagnosis not present

## 2023-01-08 LAB — COMPREHENSIVE METABOLIC PANEL
ALT: 16 U/L (ref 0–44)
AST: 22 U/L (ref 15–41)
Albumin: 2.5 g/dL — ABNORMAL LOW (ref 3.5–5.0)
Alkaline Phosphatase: 95 U/L (ref 38–126)
Anion gap: 10 (ref 5–15)
BUN: 26 mg/dL — ABNORMAL HIGH (ref 8–23)
CO2: 19 mmol/L — ABNORMAL LOW (ref 22–32)
Calcium: 9.8 mg/dL (ref 8.9–10.3)
Chloride: 98 mmol/L (ref 98–111)
Creatinine, Ser: 1.01 mg/dL (ref 0.61–1.24)
GFR, Estimated: >60 mL/min (ref >60)
Glucose, Bld: 129 mg/dL — ABNORMAL HIGH (ref 70–99)
Potassium: 4.3 mmol/L (ref 3.5–5.1)
Sodium: 127 mmol/L — ABNORMAL LOW (ref 135–145)
Total Bilirubin: 0.2 mg/dL (ref 0.0–1.2)
Total Protein: 7 g/dL (ref 6.5–8.1)

## 2023-01-08 LAB — CBC
HCT: 30.1 % — ABNORMAL LOW (ref 39.0–52.0)
Hemoglobin: 9.9 g/dL — ABNORMAL LOW (ref 13.0–17.0)
MCH: 31.2 pg (ref 26.0–34.0)
MCHC: 32.9 g/dL (ref 30.0–36.0)
MCV: 95 fL (ref 80.0–100.0)
Platelets: 161 10*3/uL (ref 150–400)
RBC: 3.17 MIL/uL — ABNORMAL LOW (ref 4.22–5.81)
RDW: 14.9 % (ref 11.5–15.5)
WBC: 13.7 10*3/uL — ABNORMAL HIGH (ref 4.0–10.5)
nRBC: 0 % (ref 0.0–0.2)

## 2023-01-08 LAB — PROTIME-INR
INR: 1 (ref 0.8–1.2)
Prothrombin Time: 13.4 s (ref 11.4–15.2)

## 2023-01-08 MED ORDER — FENTANYL CITRATE (PF) 100 MCG/2ML IJ SOLN
INTRAMUSCULAR | Status: AC
  Filled 2023-01-08: qty 2

## 2023-01-08 MED ORDER — LIDOCAINE HCL 1 % IJ SOLN
INTRAMUSCULAR | Status: AC
  Filled 2023-01-08: qty 20

## 2023-01-08 MED ORDER — IPRATROPIUM-ALBUTEROL 0.5-2.5 (3) MG/3ML IN SOLN
3.0000 mL | RESPIRATORY_TRACT | Status: DC | PRN
  Administered 2023-01-13: 3 mL via RESPIRATORY_TRACT
  Filled 2023-01-08: qty 3

## 2023-01-08 MED ORDER — GADOBUTROL 1 MMOL/ML IV SOLN
6.0000 mL | Freq: Once | INTRAVENOUS | Status: AC | PRN
  Administered 2023-01-08: 6 mL via INTRAVENOUS

## 2023-01-08 MED ORDER — METOPROLOL TARTRATE 5 MG/5ML IV SOLN
5.0000 mg | Freq: Once | INTRAVENOUS | Status: AC
  Administered 2023-01-08: 5 mg via INTRAVENOUS
  Filled 2023-01-08: qty 5

## 2023-01-08 MED ORDER — GELATIN ABSORBABLE 12-7 MM EX MISC
CUTANEOUS | Status: AC
  Filled 2023-01-08: qty 1

## 2023-01-08 MED ORDER — MIDAZOLAM HCL 2 MG/2ML IJ SOLN
INTRAMUSCULAR | Status: AC
  Filled 2023-01-08: qty 2

## 2023-01-08 NOTE — Consult Note (Signed)
 Chief Complaint: Patient was seen in consultation today for image guided liver lesion biopsy Chief Complaint  Patient presents with   Cough     Referring Physician(s): Iruku,P  Supervising Physician: Luverne Aran  Patient Status: [facility] - In-pt  History of Present Illness: Scott Lindsey is a 67 y.o. male smoker with past medical history significant for alcohol  abuse, COPD,  hyperlipidemia, hypertension,  pulmonary fibrosis who was admitted to [facility] on 12/29 with weight loss, cough, dyspnea, A-fib, acute kidney injury, hyponatremia.  CT angio chest revealed;  1. No evidence of pulmonary embolism. 2. Bulky nodal conglomerate centered at the level of the carina extending into the right hilum results in mass effect and circumferential luminal effacement of the trachea, SVC, and pulmonary arteries. Findings are highly suspicious for metastatic disease. 3. Irregular consolidation in the right middle lobe with tethering and traction of the right major fissure measures 4.2 x 3.1 cm, suspicious for malignancy, either primary or metastatic. Additional superior segment right lower lobe nodule and peripheral right middle lobe nodule. These appear new compared to 04/10/2021. 4. Ill-defined hypodensity centered within segment 4 measures 4.5 x 3.5 cm, new from 04/10/2021, suspicious for metastatic disease. 5. Reflux of contrast material into the hepatic veins, suggesting a degree of right heart dysfunction. 6.  Aortic Atherosclerosis  CT of the abdomen and pelvis on 12/30 revealed:  1. Hypoenhancing lesion centrally within liver is most consistent with hepatic metastasis. 2. Small periaortic retroperitoneal lymph nodes are concerning for metastasis 3. RIGHT middle lobe mass is concerning for primary lung neoplasm. See CT chest comparison. 4. No evidence of pancreatic mass. 5. There is moderate volume stool throughout the colon without obstructing lesion identified.  Cannot exclude colorectal carcinoma primary but favor lung cancer. 6.  Aortic Atherosclerosis   Patient is afebrile, in A-fib, WBC 13.7, hemoglobin 9.9, platelets normal, creatinine normal, PT/INR pending.  Request now received from primary team for image guided liver mass biopsy (also rec by oncology/CCM) for further evaluation.  Past Medical History:  Diagnosis Date   Back pain     Past Surgical History:  Procedure Laterality Date   MULTIPLE TOOTH EXTRACTIONS     ROBOT ASSISTED LAPAROSCOPIC RADICAL PROSTATECTOMY  12/13/2010   Procedure: ROBOTIC ASSISTED LAPAROSCOPIC RADICAL PROSTATECTOMY;  Surgeon: Alm GORMAN Fragmin, MD;  Location: [facility]S;  Service: Urology;  Laterality: N/A;  with Bilateral Pelivic Lymph Node Dissection    Allergies: Patient has no known allergies.  Medications: Prior to Admission medications   Medication Sig Start Date End Date Taking? Authorizing Provider  albuterol  (VENTOLIN  HFA) 108 (90 Base) MCG/ACT inhaler Inhale 2 puffs into the lungs every 6 (six) hours as needed for wheezing or shortness of breath. 10/12/22  Yes Norleen Lynwood ORN, MD  atorvastatin  (LIPITOR) 20 MG tablet Take 1 tablet (20 mg total) by mouth daily. 12/11/22  Yes Burnard Debby LABOR, MD  cloNIDine  (CATAPRES ) 0.1 MG tablet TAKE 1 TABLET BY MOUTH THREE TIMES DAILY Patient taking differently: Take 0.1 mg by mouth See admin instructions. Take 0.1 mg by mouth one to two times a day 07/17/22  Yes Kate Lonni CROME, MD  folic acid  (FOLVITE ) 1 MG tablet Take 1 tablet by mouth once daily 10/20/22  Yes Joshua Debby CROME, MD  losartan  (COZAAR ) 100 MG tablet Take 1 tablet by mouth once daily Patient taking differently: Take 100 mg by mouth daily. 12/11/22  Yes Burnard Debby LABOR, MD  magnesium  oxide (MAG-OX) 400 MG tablet Take 1 tablet (400  mg total) by mouth daily. Patient taking differently: Take 100 mg by mouth in the morning, at noon, in the evening, and at bedtime. 12/25/22  Yes Kate Lonni CROME, MD   metoprolol  succinate (TOPROL -XL) 25 MG 24 hr tablet Take 1 tablet by mouth once daily 07/17/22  Yes Kate Lonni CROME, MD  multivitamin (ONE-A-DAY MEN'S) TABS tablet Take 1 tablet by mouth daily with breakfast.   Yes [provider]  thiamine  (VITAMIN B1) 100 MG tablet Take 1 tablet (100 mg total) by mouth daily. Patient not taking: Reported on 01/06/2023 01/25/22   Joshua Debby CROME, MD     Family History  Problem Relation Age of Onset   Prostate cancer Brother     Social History   Socioeconomic History   Marital status: Married    Spouse name: Not on file   Number of children: Not on file   Years of education: Not on file   Highest education level: Not on file  Occupational History   Not on file  Tobacco Use   Smoking status: Every Day    Current packs/day: 0.50    Average packs/day: 0.5 packs/day for 25.0 years (12.5 ttl pk-yrs)    Types: Cigarettes   Smokeless tobacco: Never  Substance and Sexual Activity   Alcohol  use: Not Currently    Alcohol /week: 10.0 standard drinks of alcohol     Types: 10 Glasses of wine per week    Comment: occasional   Drug use: No   Sexual activity: Yes    Partners: Female  Other Topics Concern   Not on file  Social History Narrative   Not on file   Social Drivers of Health   Financial Resource Strain: Not on file  Food Insecurity: No Food Insecurity (01/06/2023)   Hunger Vital Sign    Worried About Running Out of Food in the Last Year: Never true    Ran Out of Food in the Last Year: Never true  Transportation Needs: No Transportation Needs (01/06/2023)   PRAPARE - Administrator, Civil Service (Medical): No    Lack of Transportation (Non-Medical): No  Physical Activity: Not on file  Stress: Not on file  Social Connections: Moderately Integrated (01/07/2023)   Social Connection and Isolation Panel [NHANES]    Frequency of Communication with Friends and Family: More than three times a week    Frequency of  Social Gatherings with Friends and Family: Three times a week    Attends Religious Services: 1 to 4 times per year    Active Member of Clubs or Organizations: No    Attends Banker Meetings: Never    Marital Status: Married      Review of Systems:  see above; currently denies fever, headache, chest pain, abdominal pain, back pain, nausea, vomiting or bleeding.  He does have some dyspnea with exertion, occasional cough  Vital Signs: BP 122/61 (BP Location: Right Arm)   Pulse (!) 124   Temp (!) 97.5 F (36.4 C) (Oral)   Resp 18   Ht 5' 8 (1.727 m)   Wt 132 lb 8 oz (60.1 kg)   SpO2 92%   BMI 20.15 kg/m   Advance Care Plan: No documents on file   Physical Exam: Awake, alert.  Chest with distant breath sounds bilaterally.  Heart with tachycardic rate, irregular rhythm/A-fib; abdomen soft, positive bowel sounds, nontender.  No lower extremity edema.  Imaging: CT ABDOMEN PELVIS W CONTRAST Result Date: 01/07/2023 CLINICAL DATA:  Abnormal CT  of the chest 1 day prior. Lymphadenopathy and pulmonary mass. * Tracking Code: BO * EXAM: CT ABDOMEN AND PELVIS WITH CONTRAST TECHNIQUE: Multidetector CT imaging of the abdomen and pelvis was performed using the standard protocol following bolus administration of intravenous contrast. RADIATION DOSE REDUCTION: This exam was performed according to the departmental dose-optimization program which includes automated exposure control, adjustment of the mA and/or kV according to patient size and/or use of iterative reconstruction technique. CONTRAST:  OMNIPAQUE  IOHEXOL  300 MG/ML  SOLN COMPARISON:  CT chest 01/06/2023 FINDINGS: Lower chest: Rounded 3.6 cm mass in the RIGHT middle lobe (image 8). Mediastinal hilar adenopathy not well appreciated at the base the heart. See CT chest comparison Hepatobiliary: Low-density lesion centrally within liver is hypoenhancing measuring 31 x 26 mm (image 17/2) Pancreas: No biliary duct dilatation. No  pancreatic duct dilatation. Body and tail the pancreas are mildly atrophic. Mass lesion identified. Spleen: Normal spleen Adrenals/urinary tract: Adrenal glands and kidneys are normal. The ureters and bladder normal. Stomach/Bowel: Stomach and small bowel normal. There is moderate volume stool throughout the colon without obstructing lesion identified. Vascular/Lymphatic: Dense calcification abdominal aorta. There small periaortic retroperitoneal lymph nodes measuring up to 9 mm (image 39/series 2) no pelvic lymphadenopathy. No inguinal adenopathy Reproductive: Unremarkable Other: No free fluid. Musculoskeletal: No aggressive osseous lesion. IMPRESSION: 1. Hypoenhancing lesion centrally within liver is most consistent with hepatic metastasis. 2. Small periaortic retroperitoneal lymph nodes are concerning for metastasis 3. RIGHT middle lobe mass is concerning for primary lung neoplasm. See CT chest comparison. 4. No evidence of pancreatic mass. 5. There is moderate volume stool throughout the colon without obstructing lesion identified. Cannot exclude colorectal carcinoma primary but favor lung cancer. 6.  Aortic Atherosclerosis (ICD10-I70.0). Electronically Signed   By: Jackquline Boxer M.D.   On: 01/07/2023 16:15   CT Angio Chest PE W and/or Wo Contrast Result Date: 01/06/2023 CLINICAL DATA:  Shortness of breath on exertion EXAM: CT ANGIOGRAPHY CHEST WITH CONTRAST TECHNIQUE: Multidetector CT imaging of the chest was performed using the standard protocol during bolus administration of intravenous contrast. Multiplanar CT image reconstructions and MIPs were obtained to evaluate the vascular anatomy. RADIATION DOSE REDUCTION: This exam was performed according to the departmental dose-optimization program which includes automated exposure control, adjustment of the mA and/or kV according to patient size and/or use of iterative reconstruction technique. CONTRAST:  75mL OMNIPAQUE  IOHEXOL  350 MG/ML SOLN COMPARISON:   Chest radiograph dated 10/12/2022, CT abdomen and pelvis dated 04/10/2021 FINDINGS: Cardiovascular: The study is high quality for the evaluation of pulmonary embolism. Effacement of the right main pulmonary artery and most notably the right upper lobar pulmonary artery due to bulky mediastinal lymphadenopathy. There are no filling defects in the central, lobar, segmental or subsegmental pulmonary artery branches to suggest acute pulmonary embolism. Great vessels are normal in course and caliber. Normal heart size. No significant pericardial fluid/thickening. Coronary artery calcifications and aortic atherosclerosis. Severe luminal narrowing of the SVC. Reflux of contrast material into the hepatic veins, suggesting a degree of right heart dysfunction. Multiple collaterals within the skin and paraspinal region. Mediastinum/Nodes: Imaged thyroid  gland without nodules meeting criteria for imaging follow-up by size. Normal esophagus. Bulky nodal conglomerate centered at the level of the carina extending into the right hilum spans approximately 8.1 x 4.8 cm (4:70) and results in mass effect and circumferential luminal effacement of the trachea, SVC, and pulmonary arteries. 19 mm right anterior mediastinal lymph node (4:70). Lungs/Pleura: Marked effacement of the distal trachea and bilateral proximal  bronchi, right-greater-than-left due to mediastinal lymphadenopathy. Small volume adherent secretions within the trachea. Irregular consolidation in the right middle lobe with tethering and traction of the right major fissure measures 4.2 x 3.1 cm (6:112). Superior segment right lower lobe nodule measures 2.3 x 2.2 cm (6:93). Additional peripheral nodule in the right middle lobe measures 0.9 x 0.7 cm (6:106). These appear new compared to 04/10/2021. Background findings of pulmonary fibrosis with right-greater-than-left lower lobe predominant honeycombing and traction bronchiectasis. No pneumothorax. No pleural effusion. Upper  abdomen: Ill-defined hypodensity centered within segment 4 measures 4.5 x 3.5 cm (4:142), new from 04/10/2021. Musculoskeletal: No acute or abnormal lytic or blastic osseous lesions. Review of the MIP images confirms the above findings. IMPRESSION: 1. No evidence of pulmonary embolism. 2. Bulky nodal conglomerate centered at the level of the carina extending into the right hilum results in mass effect and circumferential luminal effacement of the trachea, SVC, and pulmonary arteries. Findings are highly suspicious for metastatic disease. 3. Irregular consolidation in the right middle lobe with tethering and traction of the right major fissure measures 4.2 x 3.1 cm, suspicious for malignancy, either primary or metastatic. Additional superior segment right lower lobe nodule and peripheral right middle lobe nodule. These appear new compared to 04/10/2021. 4. Ill-defined hypodensity centered within segment 4 measures 4.5 x 3.5 cm, new from 04/10/2021, suspicious for metastatic disease. 5. Reflux of contrast material into the hepatic veins, suggesting a degree of right heart dysfunction. 6.  Aortic Atherosclerosis (ICD10-I70.0). Electronically Signed   By: Limin  Xu M.D.   On: 01/06/2023 16:36    Labs:  CBC: Recent Labs    08/02/22 1057 01/06/23 1347 01/07/23 0343 01/08/23 0345  WBC 7.3 9.4 5.4 13.7*  HGB 13.0 10.1* 9.0* 9.9*  HCT 40.0 30.2* 28.9* 30.1*  PLT 203.0 161 145* 161    COAGS: No results for input(s): INR, APTT in the last 8760 hours.  BMP: Recent Labs    08/02/22 1057 01/06/23 1347 01/07/23 0343 01/08/23 0345  NA 136 132* 129* 127*  K 4.9 4.2 4.4 4.3  CL 101 98 98 98  CO2 28 25 19* 19*  GLUCOSE 109* 119* 281* 129*  BUN 12 20 23  26*  CALCIUM  10.1 10.4* 9.6 9.8  CREATININE 1.10 1.33* 1.29* 1.01  GFRNONAA  --  58* >60 >60    LIVER FUNCTION TESTS: Recent Labs    01/25/22 1121 08/02/22 1057 01/08/23 0345  BILITOT 0.4 0.5 0.2  AST 28 20 22   ALT 14 11 16   ALKPHOS  158* 160* 95  PROT 9.1* 8.6* 7.0  ALBUMIN 4.3 4.0 2.5*    TUMOR MARKERS: No results for input(s): AFPTM, CEA, CA199, CHROMGRNA in the last 8760 hours.  Assessment and Plan: 67 y.o. male smoker with past medical history significant for alcohol  abuse, COPD,  hyperlipidemia, hypertension,  pulmonary fibrosis who was admitted to [facility] on 12/29 with weight loss, cough, dyspnea, A-fib, acute kidney injury, hyponatremia.  CT angio chest revealed;  1. No evidence of pulmonary embolism. 2. Bulky nodal conglomerate centered at the level of the carina extending into the right hilum results in mass effect and circumferential luminal effacement of the trachea, SVC, and pulmonary arteries. Findings are highly suspicious for metastatic disease. 3. Irregular consolidation in the right middle lobe with tethering and traction of the right major fissure measures 4.2 x 3.1 cm, suspicious for malignancy, either primary or metastatic. Additional superior segment right lower lobe nodule and peripheral right middle lobe nodule. These  appear new compared to 04/10/2021. 4. Ill-defined hypodensity centered within segment 4 measures 4.5 x 3.5 cm, new from 04/10/2021, suspicious for metastatic disease. 5. Reflux of contrast material into the hepatic veins, suggesting a degree of right heart dysfunction. 6.  Aortic Atherosclerosis  CT of the abdomen and pelvis on 12/30 revealed:  1. Hypoenhancing lesion centrally within liver is most consistent with hepatic metastasis. 2. Small periaortic retroperitoneal lymph nodes are concerning for metastasis 3. RIGHT middle lobe mass is concerning for primary lung neoplasm. See CT chest comparison. 4. No evidence of pancreatic mass. 5. There is moderate volume stool throughout the colon without obstructing lesion identified. Cannot exclude colorectal carcinoma primary but favor lung cancer. 6.  Aortic Atherosclerosis   Patient is afebrile, in  A-fib, WBC 13.7, hemoglobin 9.9, platelets normal, creatinine normal, PT/INR pending.  Request now received from primary team for image guided liver mass biopsy (also rec by oncology/CCM) for further evaluation.  Imaging studies have been reviewed by Dr. Luverne. Percutaneous lung biopsy would be high risk in this patient.  Risks and benefits of liver lesion biopsy was discussed with the patient  including, but not limited to bleeding, infection, damage to adjacent structures or low yield requiring additional tests.  All of the questions were answered and there is agreement to proceed.  Consent signed and in chart.    Thank you for this interesting consult.  I greatly enjoyed meeting JAKEVION ARNEY and look forward to participating in their care.  A copy of this report was sent to the requesting provider on this date.  Electronically Signed: D. Franky Rakers, PA-C 01/08/2023, 11:06 AM   I spent a total of  25 minutes   in face to face in clinical consultation, greater than 50% of which was counseling/coordinating care for ultrasound-guided liver lesion biopsy

## 2023-01-08 NOTE — Progress Notes (Signed)
   01/08/23 0946  Assess: MEWS Score  Temp (!) 97.5 F (36.4 C)  BP (!) 92/53  MAP (mmHg) 65  Pulse Rate (!) 124  ECG Heart Rate (!) 129  Resp 16  SpO2 92 %  O2 Device Room Air  Assess: MEWS Score  MEWS Temp 0  MEWS Systolic 1  MEWS Pulse 2  MEWS RR 0  MEWS LOC 0  MEWS Score 3  MEWS Score Color Yellow  Assess: if the MEWS score is Yellow or Red  Were vital signs accurate and taken at a resting state? Yes  Does the patient meet 2 or more of the SIRS criteria? Yes  Does the patient have a confirmed or suspected source of infection? No  MEWS guidelines implemented  Yes, yellow  Treat  MEWS Interventions Considered administering scheduled or prn medications/treatments as ordered  Take Vital Signs  Increase Vital Sign Frequency  Yellow: Q2hr x1, continue Q4hrs until patient remains green for 12hrs  Escalate  MEWS: Escalate Yellow: Discuss with charge nurse and consider notifying provider and/or RRT  Notify: Charge Nurse/RN  Name of Charge Nurse/RN Notified Lonell Alstrom RN  Provider Notification  Provider Name/Title Dr Drusilla  Date Provider Notified 01/08/23  Time Provider Notified [phone]  Method of Notification Face-to-face  Notification Reason Change in status  Provider response At bedside  Date of Provider Response 01/08/23  Time of Provider Response 0940  Assess: SIRS CRITERIA  SIRS Temperature  0  SIRS Respirations  0  SIRS Pulse 1  SIRS WBC 1  SIRS Score Sum  2

## 2023-01-08 NOTE — Progress Notes (Signed)
 [facility]  PROGRESS NOTE  Scott Lindsey FMW:[id number] DOB: 24-May-1955 DOA: 01/06/2023 PCP: Joshua Debby CROME, MD   Brief HPI:    67 y.o. male with medical history significant of tobacco usage, hyperlipidemia who presents emergency department with cough and shortness of breath.  Patient has been having ongoing congestion for last several weeks.  He developed a productive cough and subjective hypoxia so he presented to the ER for further assessment.  He has a longstanding smoking history. CT pulmonary embolism study which demonstrated no evidence of PE however hilar mass concerning for metastatic disease with circumferential effacement of the trachea and SVC. There is also a consolidation of the right middle lobe concerning for malignancy. Patient was admitted for further workup.     Assessment/Plan:   Lung mass -Concerning for metastatic cancer -CT chest shows hilar mass concerning for metastatic disease with circumferential effacement of the trachea and SVC -There is also consolidation of the right middle lobe concerning for malignancy -Oncology and pulmonology consulted -Bronchoscopy was considered however plan for IR guided liver biopsy -IR consulted for liver biopsy.  COPD exacerbation -Continue scheduled Pulmicort , DuoNeb nebulizers -Continue ceftriaxone , prednisone  50 mg daily  Hypertension -Continue clonidine , losartan , metoprolol   Hyperlipidemia -Continue Lipitor  Atrial fibrillation -Patient again found to be in A-fib this morning with heart rate in 130s; improved after 1 dose of IV metoprolol  -P.o. metoprolol  has been on hold in anticipation for liver biopsy today -Restart metoprolol  after liver biopsy -Anticoagulation on hold in anticipation for liver biopsy CHA2DS2VASc score is at least 3  Acute kidney injury versus CKD stage IIb -Follow creatinine in the hospital  Hyponatremia -Sodium is low at 127 -Likely due to SIADH in setting of  malignancy  Medications     arformoterol   15 mcg Nebulization BID   atorvastatin   20 mg Oral Daily   budesonide  (PULMICORT ) nebulizer solution  0.25 mg Nebulization BID   cloNIDine   0.1 mg Oral TID   enoxaparin  (LOVENOX ) injection  40 mg Subcutaneous QHS   feeding supplement  237 mL Oral BID BM   ipratropium-albuterol   3 mL Nebulization BID   losartan   100 mg Oral Daily   metoprolol  tartrate  25 mg Oral BID   predniSONE   50 mg Oral Q breakfast     Data Reviewed:   CBG:  No results for input(s): GLUCAP in the last 168 hours.  SpO2: 98 %    Vitals:   01/08/23 0356 01/08/23 0946 01/08/23 0949 01/08/23 1318  BP: (!) 100/52 (!) 92/53 122/61 104/65  Pulse:  (!) 124  71  Resp: 16 16 18 16   Temp: 97.6 F (36.4 C) (!) 97.5 F (36.4 C)  98.5 F (36.9 C)  TempSrc: Oral Oral  Oral  SpO2: 98% 92%  98%  Weight:      Height:          Data Reviewed:  Basic Metabolic Panel: Recent Labs  Lab 01/06/23 1347 01/07/23 0343 01/08/23 0345  NA 132* 129* 127*  K 4.2 4.4 4.3  CL 98 98 98  CO2 25 19* 19*  GLUCOSE 119* 281* 129*  BUN 20 23 26*  CREATININE 1.33* 1.29* 1.01  CALCIUM  10.4* 9.6 9.8    CBC: Recent Labs  Lab 01/06/23 1347 01/07/23 0343 01/08/23 0345  WBC 9.4 5.4 13.7*  HGB 10.1* 9.0* 9.9*  HCT 30.2* 28.9* 30.1*  MCV 92.9 97.3 95.0  PLT 161 145* 161    LFT Recent Labs  Lab 01/08/23 0345  AST  22  ALT 16  ALKPHOS 95  BILITOT 0.2  PROT 7.0  ALBUMIN 2.5*     Antibiotics: Anti-infectives (From admission, onward)    Start     Dose/Rate Route Frequency Ordered Stop   01/06/23 2300  cefTRIAXone  (ROCEPHIN ) 2 g in sodium chloride  0.9 % 100 mL IVPB        2 g 200 mL/hr over 30 Minutes Intravenous Every 24 hours 01/06/23 2252     01/06/23 1545  azithromycin  (ZITHROMAX ) 500 mg in sodium chloride  0.9 % 250 mL IVPB        500 mg 250 mL/hr over 60 Minutes Intravenous  Once 01/06/23 1532 01/06/23 1725        DVT prophylaxis: SCDs  Code Status: Full  code  Family Communication: Discussed with patient wife at bedside   CONSULTS    Subjective   Patient found to be in atrial fibrillation with RVR this morning, heart rate improved after he received 5 mg of IV Lopressor .  Objective    Physical Examination:  General-appears in no acute distress Heart-S1-S2,ir regular, no murmur auscultated Lungs-clear to auscultation bilaterally, no wheezing or crackles auscultated Abdomen-soft, nontender, no organomegaly Extremities-no edema in the lower extremities Neuro-alert, oriented x3, no focal deficit noted   Status is: Inpatient:             Scott Lindsey Brod   [facility] If 7PM-7AM, please contact night-coverage at [email or web address], Office  [phone]   01/08/2023, 3:14 PM  LOS: 2 days

## 2023-01-08 NOTE — Progress Notes (Signed)
 Patient ID: Scott Lindsey, male   DOB: September 19, 1955, 67 y.o.   MRN: [id number] Unable to proceed with liver lesion bx today on pt secondary to lost IV access at planned procedure time with unsuccessful attempts at placement in radiology dept. Will tent plan to do bx on 1/2 schedule permitting.

## 2023-01-08 NOTE — Progress Notes (Signed)
PCCM Chart Review  Patient unable to obtain liver biopsy with IR today due to IV access. Rescheduled to 01/09/22. Pulmonary will peripherally follow

## 2023-01-08 NOTE — Plan of Care (Signed)
  Problem: Clinical Measurements: Goal: Diagnostic test results will improve Outcome: Progressing   Problem: Pain Management: Goal: General experience of comfort will improve Outcome: Progressing   Problem: Safety: Goal: Ability to remain free from injury will improve Outcome: Progressing

## 2023-01-08 NOTE — Plan of Care (Signed)

## 2023-01-08 NOTE — Progress Notes (Signed)
Patient in a fib with HR 140-160. MD notified. New orders received. Will continue to monitor.

## 2023-01-09 DIAGNOSIS — R918 Other nonspecific abnormal finding of lung field: Secondary | ICD-10-CM | POA: Diagnosis not present

## 2023-01-09 DIAGNOSIS — I4891 Unspecified atrial fibrillation: Secondary | ICD-10-CM | POA: Diagnosis not present

## 2023-01-09 DIAGNOSIS — J441 Chronic obstructive pulmonary disease with (acute) exacerbation: Secondary | ICD-10-CM | POA: Diagnosis not present

## 2023-01-09 LAB — COMPREHENSIVE METABOLIC PANEL
ALT: 15 U/L (ref 0–44)
AST: 19 U/L (ref 15–41)
Albumin: 2.6 g/dL — ABNORMAL LOW (ref 3.5–5.0)
Alkaline Phosphatase: 93 U/L (ref 38–126)
Anion gap: 7 (ref 5–15)
BUN: 23 mg/dL (ref 8–23)
CO2: 24 mmol/L (ref 22–32)
Calcium: 10.3 mg/dL (ref 8.9–10.3)
Chloride: 104 mmol/L (ref 98–111)
Creatinine, Ser: 1.16 mg/dL (ref 0.61–1.24)
GFR, Estimated: >60 mL/min (ref >60)
Glucose, Bld: 87 mg/dL (ref 70–99)
Potassium: 4 mmol/L (ref 3.5–5.1)
Sodium: 135 mmol/L (ref 135–145)
Total Bilirubin: 0.4 mg/dL (ref 0.0–1.2)
Total Protein: 7.2 g/dL (ref 6.5–8.1)

## 2023-01-09 LAB — MAGNESIUM: Magnesium: 1.6 mg/dL — ABNORMAL LOW (ref 1.7–2.4)

## 2023-01-09 MED ORDER — PREDNISONE 20 MG PO TABS
30.0000 mg | ORAL_TABLET | Freq: Every day | ORAL | Status: DC
  Administered 2023-01-10 – 2023-01-11 (×2): 30 mg via ORAL
  Filled 2023-01-09 (×2): qty 1

## 2023-01-09 NOTE — Plan of Care (Signed)

## 2023-01-09 NOTE — Progress Notes (Signed)
 [facility]  PROGRESS NOTE  ALAIN DESCHENE FMW:[id number] DOB: 1955-05-06 DOA: 01/06/2023 PCP: Joshua Debby CROME, MD   Brief HPI:    68 y.o. male with medical history significant of tobacco usage, hyperlipidemia who presents emergency department with cough and shortness of breath.  Patient has been having ongoing congestion for last several weeks.  He developed a productive cough and subjective hypoxia so he presented to the ER for further assessment.  He has a longstanding smoking history. CT pulmonary embolism study which demonstrated no evidence of PE however hilar mass concerning for metastatic disease with circumferential effacement of the trachea and SVC. There is also a consolidation of the right middle lobe concerning for malignancy. Patient was admitted for further workup.     Assessment/Plan:   Lung mass -Concerning for metastatic cancer -CT chest shows hilar mass concerning for metastatic disease with circumferential effacement of the trachea and SVC -There is also consolidation of the right middle lobe concerning for malignancy -Oncology and pulmonology consulted -Bronchoscopy was considered however plan for IR guided liver biopsy -IR consulted for liver biopsy. -Plan for biopsy on Thursday or Friday  COPD exacerbation -Continue scheduled Pulmicort , DuoNeb nebulizers -Continue ceftriaxone , prednisone  50 mg daily -Will wean off prednisone  over next 5 days  Hypertension -Continue clonidine , losartan , metoprolol   Hyperlipidemia -Continue Lipitor  Atrial fibrillation -Patient again found to be in A-fib this morning with heart rate in 130s; improved after 1 dose of IV metoprolol  -P.o. metoprolol  has been on hold in anticipation for liver biopsy today -Restart metoprolol  after liver biopsy -Anticoagulation on hold in anticipation for liver biopsy CHA2DS2VASc score is at least 3  Acute kidney injury versus CKD stage IIb -Follow creatinine in the  hospital  Hyponatremia -Sodium improved to 135 -Likely due to SIADH in setting of malignancy  Medications     arformoterol   15 mcg Nebulization BID   atorvastatin   20 mg Oral Daily   budesonide  (PULMICORT ) nebulizer solution  0.25 mg Nebulization BID   cloNIDine   0.1 mg Oral TID   enoxaparin  (LOVENOX ) injection  40 mg Subcutaneous QHS   feeding supplement  237 mL Oral BID BM   ipratropium-albuterol   3 mL Nebulization BID   losartan   100 mg Oral Daily   metoprolol  tartrate  25 mg Oral BID   predniSONE   50 mg Oral Q breakfast     Data Reviewed:   CBG:  No results for input(s): GLUCAP in the last 168 hours.  SpO2: 98 %    Vitals:   01/08/23 1654 01/08/23 2011 01/08/23 2305 01/09/23 0251  BP: 110/74 94/76 (!) 119/98   Pulse: 99 92 (!) 143 (!) 101  Resp: (!) 24 18    Temp: 98 F (36.7 C) 98.4 F (36.9 C)  98.5 F (36.9 C)  TempSrc: Oral Oral  Oral  SpO2: 100%   98%  Weight:      Height:          Data Reviewed:  Basic Metabolic Panel: Recent Labs  Lab 01/06/23 1347 01/07/23 0343 01/08/23 0345 01/09/23 0646  NA 132* 129* 127* 135  K 4.2 4.4 4.3 4.0  CL 98 98 98 104  CO2 25 19* 19* 24  GLUCOSE 119* 281* 129* 87  BUN 20 23 26* 23  CREATININE 1.33* 1.29* 1.01 1.16  CALCIUM  10.4* 9.6 9.8 10.3  MG  --   --   --  1.6*    CBC: Recent Labs  Lab 01/06/23 1347 01/07/23 0343 01/08/23 0345  WBC 9.4 5.4 13.7*  HGB 10.1* 9.0* 9.9*  HCT 30.2* 28.9* 30.1*  MCV 92.9 97.3 95.0  PLT 161 145* 161    LFT Recent Labs  Lab 01/08/23 0345 01/09/23 0646  AST 22 19  ALT 16 15  ALKPHOS 95 93  BILITOT 0.2 0.4  PROT 7.0 7.2  ALBUMIN 2.5* 2.6*     Antibiotics: Anti-infectives (From admission, onward)    Start     Dose/Rate Route Frequency Ordered Stop   01/06/23 2300  cefTRIAXone  (ROCEPHIN ) 2 g in sodium chloride  0.9 % 100 mL IVPB        2 g 200 mL/hr over 30 Minutes Intravenous Every 24 hours 01/06/23 2252     01/06/23 1545  azithromycin  (ZITHROMAX )  500 mg in sodium chloride  0.9 % 250 mL IVPB        500 mg 250 mL/hr over 60 Minutes Intravenous  Once 01/06/23 1532 01/06/23 1725        DVT prophylaxis: SCDs  Code Status: Full code  Family Communication: Discussed with patient wife at bedside   CONSULTS    Subjective   Denies shortness of breath  Objective    Physical Examination:  General-appears in no acute distress Heart-S1-S2, regular, no murmur auscultated Lungs-clear to auscultation bilaterally, no wheezing or crackles auscultated Abdomen-soft, nontender, no organomegaly Extremities-no edema in the lower extremities Neuro-alert, oriented x3, no focal deficit noted   Status is: Inpatient:             Sabas GORMAN Brod   [facility] If 7PM-7AM, please contact night-coverage at [email or web address], Office  [phone]   01/09/2023, 10:05 AM  LOS: 3 days

## 2023-01-10 ENCOUNTER — Inpatient Hospital Stay (HOSPITAL_COMMUNITY): Payer: Medicare Other

## 2023-01-10 DIAGNOSIS — J438 Other emphysema: Secondary | ICD-10-CM

## 2023-01-10 DIAGNOSIS — R918 Other nonspecific abnormal finding of lung field: Secondary | ICD-10-CM | POA: Diagnosis not present

## 2023-01-10 DIAGNOSIS — I4891 Unspecified atrial fibrillation: Secondary | ICD-10-CM | POA: Diagnosis not present

## 2023-01-10 MED ORDER — LIDOCAINE HCL 1 % IJ SOLN
INTRAMUSCULAR | Status: AC
  Filled 2023-01-10: qty 20

## 2023-01-10 MED ORDER — GELATIN ABSORBABLE 12-7 MM EX MISC
CUTANEOUS | Status: AC
  Filled 2023-01-10: qty 1

## 2023-01-10 MED ORDER — MAGNESIUM SULFATE 2 GM/50ML IV SOLN
2.0000 g | Freq: Once | INTRAVENOUS | Status: AC
  Administered 2023-01-10: 2 g via INTRAVENOUS
  Filled 2023-01-10: qty 50

## 2023-01-10 MED ORDER — ENOXAPARIN SODIUM 40 MG/0.4ML IJ SOSY: 40.0000 mg | PREFILLED_SYRINGE | Freq: Every day | INTRAMUSCULAR | Status: DC

## 2023-01-10 NOTE — Progress Notes (Signed)
 NAME:  Scott Lindsey, MRN:  [id number], DOB:  Sep 11, 1955, LOS: 4 ADMISSION DATE:  01/06/2023, CONSULTATION DATE: 01/07/2023 REFERRING MD: Sabas Brod, MD  CHIEF COMPLAINT: Lung mass  History of Present Illness:  68 year old male active tobacco abuse (0.5 ppd x 30 years), alcohol  abuse (1 pint/week), COPD, pulmonary fibrosis, recent weight loss (20 lbs in the last 6 months) who presents for worsening cough, shortness of breath and congestion. He is a poor historian at baseline and difficult to elicit history. Chart reviewed for history.   He was treated for COPD in October 10/2022 and CXR commented on thickening of right paratracheal stripe that had increased in size compared to 01/25/22 CXR concerning for lymphadenopathy. CT chest was ordered and pulmonary referral placed. However when he was seen by PCP on 12/21/22  for COPD exacerbation, patient still had not had CT completed or scheduled with Pulmonary due to not answering phone calls to set up appointment.   CTA neg for PE with bulky mediastinal and hilar adenopathy with mass effect on trachea, SVC and pulmonary arteries and RML consolidation/nodule and additional nodules including RLL nodule. Findings concerning for malignancy. CT A/P with 31 x 26 mm low density hepatic lesion.   He reports he has weight loss in the last 6-12 months up to 20 lbs and has chronic shortness of breath and wheezing with activity including with short distances on his driveway. Has productive cough as well. Still smoking.  Pertinent  Medical History  COPD, pulmonary fibrosis, HTN, hx alcoholic pancreatitis, hx alcohol  abuse, HLD, hx prostate cancer   Significant Hospital Events: Including procedures, antibiotic start and stop dates in addition to other pertinent events   01/10/2023-liver biopsy rescheduled for 01/11/2023  Interim History / Subjective:  No overnight events Denies any pain or discomfort Just wants to get his procedure done so I can go  home  Objective   Blood pressure 125/73, pulse 99, temperature 97.6 F (36.4 C), temperature source Axillary, resp. rate 20, height 5' 8 (1.727 m), weight 60.1 kg, SpO2 91%.        Intake/Output Summary (Last 24 hours) at 01/10/2023 1111 Last data filed at 01/10/2023 0500 Gross per 24 hour  Intake --  Output 650 ml  Net -650 ml   Filed Weights   01/06/23 1815 01/06/23 2027  Weight: 59.9 kg 60.1 kg    Examination: General: Elderly, does not appear to be in acute distress HENT: Moist oral mucosa Lungs: Rales bibasilarly Cardiovascular: S1-S2 appreciated Abdomen: Soft, bowel sounds appreciated Extremities: No clubbing, no edema Neuro: Alert and oriented x 3 GU:   Resolved Hospital Problem list     Assessment & Plan:  Bulky and circumferential mediastinal and hilar lymphadenopathy with mass effect including luminal effacement of trachea, SVC and pulmonary arteries Right middle lobe and right lower lobe satellite nodules Hepatic lesions Significant concern for mets -Currently still being treated for pneumonia -Plan is for liver biopsy -Now rescheduled for 01/11/2023  Obstructive lung disease Pneumonia -Being treated  Pulmonary fibrosis -Continue nebulization treatments -Will need outpatient follow-up  No other invasive intervention planned at present outside liver biopsy, may require bronchoscopy if biopsy not diagnostic  Best Practice (right click and Reselect all SmartList Selections daily)   Per primary  Labs   CBC: Recent Labs  Lab 01/06/23 1347 01/07/23 0343 01/08/23 0345  WBC 9.4 5.4 13.7*  HGB 10.1* 9.0* 9.9*  HCT 30.2* 28.9* 30.1*  MCV 92.9 97.3 95.0  PLT 161 145* 161  Basic Metabolic Panel: Recent Labs  Lab 01/06/23 1347 01/07/23 0343 01/08/23 0345 01/09/23 0646  NA 132* 129* 127* 135  K 4.2 4.4 4.3 4.0  CL 98 98 98 104  CO2 25 19* 19* 24  GLUCOSE 119* 281* 129* 87  BUN 20 23 26* 23  CREATININE 1.33* 1.29* 1.01 1.16  CALCIUM  10.4*  9.6 9.8 10.3  MG  --   --   --  1.6*   GFR: Estimated Creatinine Clearance: 51.1 mL/min (by C-G formula based on SCr of 1.16 mg/dL). Recent Labs  Lab 01/06/23 1347 01/07/23 0343 01/08/23 0345  WBC 9.4 5.4 13.7*    Liver Function Tests: Recent Labs  Lab 01/08/23 0345 01/09/23 0646  AST 22 19  ALT 16 15  ALKPHOS 95 93  BILITOT 0.2 0.4  PROT 7.0 7.2  ALBUMIN 2.5* 2.6*   No results for input(s): LIPASE, AMYLASE in the last 168 hours. No results for input(s): AMMONIA in the last 168 hours.  ABG No results found for: PHART, PCO2ART, PO2ART, HCO3, TCO2, ACIDBASEDEF, O2SAT   Coagulation Profile: Recent Labs  Lab 01/08/23 1037  INR 1.0    Cardiac Enzymes: No results for input(s): CKTOTAL, CKMB, CKMBINDEX, TROPONINI in the last 168 hours.  HbA1C: No results found for: HGBA1C  CBG: No results for input(s): GLUCAP in the last 168 hours.  Review of Systems:   Not in pain or discomfort, no significant coughing  Past Medical History:  He,  has a past medical history of Back pain.   Surgical History:   Past Surgical History:  Procedure Laterality Date   MULTIPLE TOOTH EXTRACTIONS     ROBOT ASSISTED LAPAROSCOPIC RADICAL PROSTATECTOMY  12/13/2010   Procedure: ROBOTIC ASSISTED LAPAROSCOPIC RADICAL PROSTATECTOMY;  Surgeon: Alm GORMAN Fragmin, MD;  Location: [facility];  Service: Urology;  Laterality: N/A;  with Bilateral Pelivic Lymph Node Dissection     Social History:   reports that he has been smoking cigarettes. He has a 12.5 pack-year smoking history. He has never used smokeless tobacco. He reports that he does not currently use alcohol  after a past usage of about 10.0 standard drinks of alcohol  per week. He reports that he does not use drugs.   Family History:  His family history includes Prostate cancer in his brother.   Allergies No Known Allergies   Jennet Epley, MD [facility] Pager: See Tracey

## 2023-01-10 NOTE — Plan of Care (Signed)
  Problem: Education: Goal: Knowledge of General Education information will improve Description: Including pain rating scale, medication(s)/side effects and non-pharmacologic comfort measures Outcome: Progressing   Problem: Health Behavior/Discharge Planning: Goal: Ability to manage health-related needs will improve Outcome: Progressing   Problem: Clinical Measurements: Goal: Ability to maintain clinical measurements within normal limits will improve Outcome: Progressing Goal: Will remain free from infection Outcome: Progressing Goal: Diagnostic test results will improve Outcome: Progressing Goal: Respiratory complications will improve Outcome: Progressing Goal: Cardiovascular complication will be avoided Outcome: Progressing   Problem: Activity: Goal: Risk for activity intolerance will decrease Outcome: Progressing   Problem: Nutrition: Goal: Adequate nutrition will be maintained Outcome: Progressing   Problem: Elimination: Goal: Will not experience complications related to bowel motility Outcome: Progressing   Problem: Safety: Goal: Ability to remain free from injury will improve Outcome: Progressing   Problem: Coping: Goal: Level of anxiety will decrease Outcome: Adequate for Discharge   Problem: Elimination: Goal: Will not experience complications related to urinary retention Outcome: Adequate for Discharge   Problem: Pain Management: Goal: General experience of comfort will improve Outcome: Adequate for Discharge   Problem: Skin Integrity: Goal: Risk for impaired skin integrity will decrease Outcome: Adequate for Discharge

## 2023-01-10 NOTE — Progress Notes (Signed)
 [facility]  PROGRESS NOTE  Scott Lindsey FMW:[id number] DOB: [birth year] DOA: 01/06/2023 PCP: Joshua Debby CROME, MD   Brief HPI:    68 y.o. male with medical history significant of tobacco usage, hyperlipidemia who presents emergency department with cough and shortness of breath.  Patient has been having ongoing congestion for last several weeks.  He developed a productive cough and subjective hypoxia so he presented to the ER for further assessment.  He has a longstanding smoking history. CT pulmonary embolism study which demonstrated no evidence of PE however hilar mass concerning for metastatic disease with circumferential effacement of the trachea and SVC. There is also a consolidation of the right middle lobe concerning for malignancy. Patient was admitted for further workup.     Assessment/Plan:   Lung mass -Concerning for metastatic cancer -CT chest shows hilar mass concerning for metastatic disease with circumferential effacement of the trachea and SVC -There is also consolidation of the right middle lobe concerning for malignancy -Oncology and pulmonology consulted -Bronchoscopy was considered however plan for IR guided liver biopsy -IR consulted for liver biopsy. -Plan for biopsy on tomorrow  COPD exacerbation -Continue scheduled Pulmicort , DuoNeb nebulizers -Continue ceftriaxone , prednisone  50 mg daily -Will wean off prednisone  over next 5 days  Hypertension -Continue clonidine , losartan , metoprolol   Hyperlipidemia -Continue Lipitor  Atrial fibrillation -Patient again found to be in A-fib this morning with heart rate in 130s; improved after 1 dose of IV metoprolol  -P.o. metoprolol  has been on hold in anticipation for liver biopsy today -Restart metoprolol  after liver biopsy -Anticoagulation on hold in anticipation for liver biopsy CHA2DS2VASc score is at least 3  Acute kidney injury versus CKD stage IIb -Follow creatinine in the  hospital  Hyponatremia -Sodium improved to 135 -Likely due to SIADH in setting of malignancy  Hypomagnesemia -Magnesium  1.6 -Replace magnesium  with 2 g mag sulfate IV x1  Medications     arformoterol   15 mcg Nebulization BID   atorvastatin   20 mg Oral Daily   budesonide  (PULMICORT ) nebulizer solution  0.25 mg Nebulization BID   cloNIDine   0.1 mg Oral TID   enoxaparin  (LOVENOX ) injection  40 mg Subcutaneous QHS   feeding supplement  237 mL Oral BID BM   ipratropium-albuterol   3 mL Nebulization BID   losartan   100 mg Oral Daily   metoprolol  tartrate  25 mg Oral BID   predniSONE   30 mg Oral Q breakfast     Data Reviewed:   CBG:  No results for input(s): GLUCAP in the last 168 hours.  SpO2: 93 %    Vitals:   01/09/23 2042 01/09/23 2045 01/09/23 2307 01/10/23 0342  BP:   118/61 100/69  Pulse:   98 86  Resp:    16  Temp:    97.7 F (36.5 C)  TempSrc:    Oral  SpO2: 92% 93%    Weight:      Height:          Data Reviewed:  Basic Metabolic Panel: Recent Labs  Lab 01/06/23 1347 01/07/23 0343 01/08/23 0345 01/09/23 0646  NA 132* 129* 127* 135  K 4.2 4.4 4.3 4.0  CL 98 98 98 104  CO2 25 19* 19* 24  GLUCOSE 119* 281* 129* 87  BUN 20 23 26* 23  CREATININE 1.33* 1.29* 1.01 1.16  CALCIUM  10.4* 9.6 9.8 10.3  MG  --   --   --  1.6*    CBC: Recent Labs  Lab 01/06/23 1347 01/07/23 0343 01/08/23 0345  WBC 9.4 5.4 13.7*  HGB 10.1* 9.0* 9.9*  HCT 30.2* 28.9* 30.1*  MCV 92.9 97.3 95.0  PLT 161 145* 161    LFT Recent Labs  Lab 01/08/23 0345 01/09/23 0646  AST 22 19  ALT 16 15  ALKPHOS 95 93  BILITOT 0.2 0.4  PROT 7.0 7.2  ALBUMIN 2.5* 2.6*     Antibiotics: Anti-infectives (From admission, onward)    Start     Dose/Rate Route Frequency Ordered Stop   01/06/23 2300  cefTRIAXone  (ROCEPHIN ) 2 g in sodium chloride  0.9 % 100 mL IVPB        2 g 200 mL/hr over 30 Minutes Intravenous Every 24 hours 01/06/23 2252     01/06/23 1545  azithromycin   (ZITHROMAX ) 500 mg in sodium chloride  0.9 % 250 mL IVPB        500 mg 250 mL/hr over 60 Minutes Intravenous  Once 01/06/23 1532 01/06/23 1725        DVT prophylaxis: SCDs  Code Status: Full code  Family Communication: Discussed with patient wife at bedside   CONSULTS    Subjective   Denies any complaints.  Objective    Physical Examination:  General-appears in no acute distress Heart-S1-S2, regular, no murmur auscultated Lungs-clear to auscultation bilaterally Abdomen-soft, nontender, no organomegaly Extremities-no edema in the lower extremities Neuro-alert, oriented x3, no focal deficit noted   Status is: Inpatient:             Scott Lindsey   [facility] If 7PM-7AM, please contact night-coverage at [email or web address], Office  [phone]   01/10/2023, 8:20 AM  LOS: 4 days

## 2023-01-11 ENCOUNTER — Inpatient Hospital Stay (HOSPITAL_COMMUNITY): Payer: Medicare Other

## 2023-01-11 ENCOUNTER — Ambulatory Visit: Payer: Medicare Other | Admitting: Adult Health

## 2023-01-11 ENCOUNTER — Other Ambulatory Visit: Payer: Medicare Other

## 2023-01-11 DIAGNOSIS — J438 Other emphysema: Secondary | ICD-10-CM | POA: Diagnosis not present

## 2023-01-11 DIAGNOSIS — J441 Chronic obstructive pulmonary disease with (acute) exacerbation: Secondary | ICD-10-CM | POA: Diagnosis not present

## 2023-01-11 DIAGNOSIS — I4891 Unspecified atrial fibrillation: Secondary | ICD-10-CM | POA: Diagnosis not present

## 2023-01-11 DIAGNOSIS — R918 Other nonspecific abnormal finding of lung field: Secondary | ICD-10-CM | POA: Diagnosis not present

## 2023-01-11 LAB — MAGNESIUM: Magnesium: 2 mg/dL (ref 1.7–2.4)

## 2023-01-11 LAB — CBC
HCT: 31.1 % — ABNORMAL LOW (ref 39.0–52.0)
Hemoglobin: 9.9 g/dL — ABNORMAL LOW (ref 13.0–17.0)
MCH: 31.2 pg (ref 26.0–34.0)
MCHC: 31.8 g/dL (ref 30.0–36.0)
MCV: 98.1 fL (ref 80.0–100.0)
Platelets: 182 10*3/uL (ref 150–400)
RBC: 3.17 MIL/uL — ABNORMAL LOW (ref 4.22–5.81)
RDW: 14.9 % (ref 11.5–15.5)
WBC: 12.9 10*3/uL — ABNORMAL HIGH (ref 4.0–10.5)
nRBC: 0 % (ref 0.0–0.2)

## 2023-01-11 LAB — APTT: aPTT: 28 s (ref 24–36)

## 2023-01-11 MED ORDER — FENTANYL CITRATE (PF) 100 MCG/2ML IJ SOLN
INTRAMUSCULAR | Status: AC
  Filled 2023-01-11: qty 2

## 2023-01-11 MED ORDER — ADULT MULTIVITAMIN W/MINERALS CH
1.0000 | ORAL_TABLET | Freq: Every day | ORAL | Status: DC
  Administered 2023-01-12 – 2023-01-16 (×5): 1 via ORAL
  Filled 2023-01-11 (×5): qty 1

## 2023-01-11 MED ORDER — MIDAZOLAM HCL 2 MG/2ML IJ SOLN
INTRAMUSCULAR | Status: AC | PRN
  Administered 2023-01-11: 1 mg via INTRAVENOUS

## 2023-01-11 MED ORDER — MIDAZOLAM HCL 2 MG/2ML IJ SOLN
INTRAMUSCULAR | Status: AC
  Filled 2023-01-11: qty 2

## 2023-01-11 MED ORDER — PREDNISONE 20 MG PO TABS
20.0000 mg | ORAL_TABLET | Freq: Every day | ORAL | Status: DC
  Administered 2023-01-12 – 2023-01-16 (×5): 20 mg via ORAL
  Filled 2023-01-11 (×5): qty 1

## 2023-01-11 MED ORDER — BOOST / RESOURCE BREEZE PO LIQD CUSTOM
1.0000 | Freq: Three times a day (TID) | ORAL | Status: DC
  Administered 2023-01-11 – 2023-01-15 (×3): 1 via ORAL

## 2023-01-11 MED ORDER — FENTANYL CITRATE (PF) 100 MCG/2ML IJ SOLN
INTRAMUSCULAR | Status: AC | PRN
  Administered 2023-01-11: 50 ug via INTRAVENOUS

## 2023-01-11 MED ORDER — ENOXAPARIN SODIUM 40 MG/0.4ML IJ SOSY
40.0000 mg | PREFILLED_SYRINGE | INTRAMUSCULAR | Status: DC
  Administered 2023-01-12 – 2023-01-16 (×5): 40 mg via SUBCUTANEOUS
  Filled 2023-01-11 (×5): qty 0.4

## 2023-01-11 MED ORDER — LIDOCAINE-EPINEPHRINE (PF) 2 %-1:200000 IJ SOLN
INTRAMUSCULAR | Status: AC
  Filled 2023-01-11: qty 20

## 2023-01-11 MED ORDER — LIDOCAINE HCL 1 % IJ SOLN
INTRAMUSCULAR | Status: AC | PRN
  Administered 2023-01-11: 5 mL via INTRADERMAL

## 2023-01-11 NOTE — Sedation Documentation (Signed)
 Patient is resting comfortably.

## 2023-01-11 NOTE — Progress Notes (Signed)
 [facility]  PROGRESS NOTE  Scott Lindsey FMW:[id number] DOB: 1955/08/04 DOA: 01/06/2023 PCP: Joshua Debby CROME, MD   Brief HPI:    68 y.o. male with medical history significant of tobacco usage, hyperlipidemia who presents emergency department with cough and shortness of breath.  Patient has been having ongoing congestion for last several weeks.  He developed a productive cough and subjective hypoxia so he presented to the ER for further assessment.  He has a longstanding smoking history. CT pulmonary embolism study which demonstrated no evidence of PE however hilar mass concerning for metastatic disease with circumferential effacement of the trachea and SVC. There is also a consolidation of the right middle lobe concerning for malignancy. Patient was admitted for further workup.     Assessment/Plan:   Lung mass -Concerning for metastatic cancer -CT chest shows hilar mass concerning for metastatic disease with circumferential effacement of the trachea and SVC -There is also consolidation of the right middle lobe concerning for malignancy -Oncology and pulmonology consulted -Bronchoscopy was considered however plan for IR guided liver biopsy -IR consulted for liver biopsy. -Plan for biopsy today.  COPD exacerbation -Continue scheduled Pulmicort , DuoNeb nebulizers -Continue ceftriaxone , prednisone  50 mg daily -Will wean off prednisone  over next 5 days  Hypertension -Continue clonidine , losartan , metoprolol   Hyperlipidemia -Continue Lipitor  Atrial fibrillation -Patient again found to be in A-fib this morning with heart rate in 130s; improved after 1 dose of IV metoprolol  -P.o. metoprolol  has been on hold in anticipation for liver biopsy today -Restart metoprolol  after liver biopsy -Anticoagulation on hold in anticipation for liver biopsy CHA2DS2VASc score is at least 3  Acute kidney injury versus CKD stage IIb -Follow creatinine in the hospital  Hyponatremia -Sodium  improved to 135 -Likely due to SIADH in setting of malignancy  Hypomagnesemia -Magnesium  1.6 -Replace magnesium  with 2 g mag sulfate IV x1 -Recheck magnesium  .  Medications     arformoterol   15 mcg Nebulization BID   atorvastatin   20 mg Oral Daily   budesonide  (PULMICORT ) nebulizer solution  0.25 mg Nebulization BID   cloNIDine   0.1 mg Oral TID   enoxaparin  (LOVENOX ) injection  40 mg Subcutaneous QHS   feeding supplement  237 mL Oral BID BM   ipratropium-albuterol   3 mL Nebulization BID   losartan   100 mg Oral Daily   metoprolol  tartrate  25 mg Oral BID   predniSONE   30 mg Oral Q breakfast     Data Reviewed:   CBG:  No results for input(s): GLUCAP in the last 168 hours.  SpO2: 99 % O2 Flow Rate (L/min): 3 L/min    Vitals:   01/11/23 0945 01/11/23 1000 01/11/23 1005 01/11/23 1010  BP:  126/69 112/63 (!) 101/58  Pulse:  (!) 112 (!) 116 (!) 117  Resp:  20 19 16   Temp:      TempSrc:      SpO2: 100% 100% 100% 99%  Weight:      Height:          Data Reviewed:  Basic Metabolic Panel: Recent Labs  Lab 01/06/23 1347 01/07/23 0343 01/08/23 0345 01/09/23 0646  NA 132* 129* 127* 135  K 4.2 4.4 4.3 4.0  CL 98 98 98 104  CO2 25 19* 19* 24  GLUCOSE 119* 281* 129* 87  BUN 20 23 26* 23  CREATININE 1.33* 1.29* 1.01 1.16  CALCIUM  10.4* 9.6 9.8 10.3  MG  --   --   --  1.6*    CBC: Recent  Labs  Lab 01/06/23 1347 01/07/23 0343 01/08/23 0345 01/11/23 0424  WBC 9.4 5.4 13.7* 12.9*  HGB 10.1* 9.0* 9.9* 9.9*  HCT 30.2* 28.9* 30.1* 31.1*  MCV 92.9 97.3 95.0 98.1  PLT 161 145* 161 182    LFT Recent Labs  Lab 01/08/23 0345 01/09/23 0646  AST 22 19  ALT 16 15  ALKPHOS 95 93  BILITOT 0.2 0.4  PROT 7.0 7.2  ALBUMIN 2.5* 2.6*     Antibiotics: Anti-infectives (From admission, onward)    Start     Dose/Rate Route Frequency Ordered Stop   01/06/23 2300  cefTRIAXone  (ROCEPHIN ) 2 g in sodium chloride  0.9 % 100 mL IVPB        2 g 200 mL/hr over 30  Minutes Intravenous Every 24 hours 01/06/23 2252     01/06/23 1545  azithromycin  (ZITHROMAX ) 500 mg in sodium chloride  0.9 % 250 mL IVPB        500 mg 250 mL/hr over 60 Minutes Intravenous  Once 01/06/23 1532 01/06/23 1725        DVT prophylaxis: SCDs  Code Status: Full code  Family Communication: Discussed with patient wife at bedside   CONSULTS    Subjective  . Denies any symptoms.  Plan for IR guided liver biopsy.  Objective    Physical Examination:  Appears in no acute distress S1-S2, regular, no murmur auscultated Lungs clear to auscultation bilaterally Abdomen is soft, nontender, no organomegaly   Status is: Inpatient:             Scott Lindsey Brod   [facility] If 7PM-7AM, please contact night-coverage at [email or web address], Office  [phone]   01/11/2023, 10:17 AM  LOS: 5 days

## 2023-01-11 NOTE — Progress Notes (Signed)
 Initial Nutrition Assessment  DOCUMENTATION CODES:   Severe malnutrition in context of chronic illness  INTERVENTION:  - Regular diet.  - Boost Breeze po TID, each supplement provides 250 kcal and 9 grams of protein - Magic cup BID with meals, each supplement provides 290 kcal and 9 grams of protein - Encourage intake at all meals and of nutrition supplements.  - Add Multivitamin with minerals daily - Monitor weight trends.   NUTRITION DIAGNOSIS:   Severe Malnutrition related to chronic illness as evidenced by mild fat depletion, severe muscle depletion, percent weight loss (15.4% in 8 months).  GOAL:   Patient will meet greater than or equal to 90% of their needs  MONITOR:   PO intake, Supplement acceptance, Weight trends  REASON FOR ASSESSMENT:   Malnutrition Screening Tool    ASSESSMENT:   68 y.o. male with PMH significant for HLD who presented with cough and shortness of breath. Admitted for COPD exacerbation and lung mass concerning for metastatic cancer.   Patient reports a UBW of 150# and weight loss for several months PTA. Admits he doesn't know exactly when it started as he didn't really notice he was losing weight, just that the scale said he was losing. Per EMR, patient weighed at 156# in April and weighed at 132# this admission. This is a 24# or 15.4% weight loss in 8 months, which is significant for the time frame.  He reports having no set eating schedule PTA and just eating when he was hungry. Admits his appetite was poor and he was eating less PTA.   Since admit, he endorses eating very well as he is constantly being encouraged to eat by staff and family. Documented to be consuming 50-100% of meals.  Ordered Ensure and drinking occasionally but patient reports it gives him diarrhea so he doesn't want to get it anymore. Agreeable to try Boost Breeze and [employer] instead.  Encouraged patient to continue to eat well and to consume supplements to meet  increased kcal and protein needs. Patient has a goal for weight gain so motivated to continue to eat well.    Medications reviewed and include: -  Labs reviewed:  Magnesium  1.6   NUTRITION - FOCUSED PHYSICAL EXAM:  Flowsheet Row Most Recent Value  Orbital Region Mild depletion  Upper Arm Region Severe depletion  Thoracic and Lumbar Region Mild depletion  Buccal Region No depletion  Temple Region Mild depletion  Clavicle Bone Region Mild depletion  Clavicle and Acromion Bone Region Moderate depletion  Scapular Bone Region Unable to assess  Dorsal Hand Mild depletion  Patellar Region Severe depletion  Anterior Thigh Region Severe depletion  Posterior Calf Region Severe depletion  Edema (RD Assessment) None  Hair Reviewed  Eyes Reviewed  Mouth Reviewed  Skin Reviewed  Nails Reviewed       Diet Order:   Diet Order             Diet regular Room service appropriate? Yes; Fluid consistency: Thin  Diet effective now                   EDUCATION NEEDS:  Education needs have been addressed  Skin:  Skin Assessment: Reviewed RN Assessment  Last BM:  1/2 - type 6  Height:  Ht Readings from Last 1 Encounters:  01/06/23 5' 8 (1.727 m)   Weight:  Wt Readings from Last 1 Encounters:  01/06/23 60.1 kg   BMI:  Body mass index is 20.15 kg/m.  Estimated Nutritional  Needs:  Kcal:  1800-2000 kcals Protein:  90-105 grams Fluid:  >/= 1.8L    Trude Ned RD, LDN Contact via Secure Chat.

## 2023-01-11 NOTE — Procedures (Signed)
 Pre Procedure Dx: Left supraclavicular lymphadenopathy Post Procedural Dx: Same  Technically successful US guided biopsy of indeterminate left supraclavicular lymph node.   EBL: None No immediate complications.   Katherina Right, MD Pager #: [phone]

## 2023-01-11 NOTE — Plan of Care (Signed)
  Problem: Education: Goal: Knowledge of General Education information will improve Description: Including pain rating scale, medication(s)/side effects and non-pharmacologic comfort measures Outcome: Progressing   Problem: Health Behavior/Discharge Planning: Goal: Ability to manage health-related needs will improve Outcome: Progressing   Problem: Clinical Measurements: Goal: Ability to maintain clinical measurements within normal limits will improve Outcome: Progressing Goal: Will remain free from infection Outcome: Progressing Goal: Diagnostic test results will improve Outcome: Progressing Goal: Respiratory complications will improve Outcome: Progressing Goal: Cardiovascular complication will be avoided Outcome: Progressing   Problem: Activity: Goal: Risk for activity intolerance will decrease Outcome: Progressing   Problem: Nutrition: Goal: Adequate nutrition will be maintained Outcome: Progressing   Problem: Safety: Goal: Ability to remain free from injury will improve Outcome: Progressing   Problem: Coping: Goal: Level of anxiety will decrease Outcome: Adequate for Discharge   Problem: Elimination: Goal: Will not experience complications related to bowel motility Outcome: Adequate for Discharge Goal: Will not experience complications related to urinary retention Outcome: Adequate for Discharge   Problem: Pain Management: Goal: General experience of comfort will improve Outcome: Adequate for Discharge   Problem: Skin Integrity: Goal: Risk for impaired skin integrity will decrease Outcome: Adequate for Discharge

## 2023-01-11 NOTE — TOC CM/SW Note (Signed)
 Transition of Care [facility]) - Inpatient Brief Assessment   Patient Details  Name: SAMPSON SELF MRN: [id number] Date of Birth: 12/05/1955  Transition of Care [facility]) CM/SW Contact:    Tawni CHRISTELLA Eva, LCSW Phone Number: 01/11/2023, 2:32 PM   Clinical Narrative:  Transition of Care Department [facility]) has reviewed patient and no TOC needs have been identified at this time. We will continue to monitor patient advancement through interdisciplinary progression rounds. If new patient transition needs arise, please place a TOC consult.  Transition of Care Asessment: Insurance and Status: Insurance coverage has been reviewed Patient has primary care physician: Yes Home environment has been reviewed: home with spouse Prior level of function:: independent Prior/Current Home Services: No current home services Social Drivers of Health Review: SDOH reviewed no interventions necessary Readmission risk has been reviewed: Yes Transition of care needs: no transition of care needs at this time

## 2023-01-11 NOTE — Progress Notes (Signed)
 MEDICATION-RELATED CONSULT NOTE   IR Procedure Consult -   Anticoagulant/Antiplatelet PTA/Inpatient Med List Review by Pharmacist    Procedure:  Left supraclavicular lymphadenopathy     Completed: 1/3 at 10:59  Post-Procedural bleeding risk per IR MD assessment:   - standard   Antithrombotic medications on inpatient or PTA profile prior to procedure:    - enoxaparin  40 mg daily     Recommended restart time per IR Post-Procedure Guidelines:   - Next AM    Other considerations:      Plan:     - Retime  enoxaparin  to 1000 on 1/4.     Ernst Cumpston, PharmD, BCPS 01/11/2023 11:27 AM

## 2023-01-12 DIAGNOSIS — J441 Chronic obstructive pulmonary disease with (acute) exacerbation: Secondary | ICD-10-CM | POA: Diagnosis not present

## 2023-01-12 MED ORDER — METOPROLOL TARTRATE 5 MG/5ML IV SOLN
2.5000 mg | Freq: Four times a day (QID) | INTRAVENOUS | Status: DC | PRN
  Administered 2023-01-12 – 2023-01-14 (×2): 2.5 mg via INTRAVENOUS
  Filled 2023-01-12 (×3): qty 5

## 2023-01-12 MED ORDER — METOPROLOL TARTRATE 50 MG PO TABS
50.0000 mg | ORAL_TABLET | Freq: Two times a day (BID) | ORAL | Status: DC
  Administered 2023-01-13 – 2023-01-16 (×6): 50 mg via ORAL
  Filled 2023-01-12 (×8): qty 1

## 2023-01-12 NOTE — Plan of Care (Signed)

## 2023-01-12 NOTE — Progress Notes (Signed)
 PROGRESS NOTE    Scott Lindsey  FMW:[id number] DOB: 1955/01/12 DOA: 01/06/2023 PCP: Joshua Debby CROME, MD   Brief Narrative:  67 y.o. male with medical history significant of tobacco usage, hyperlipidemia presented with shortness of breath and cough.  On presentation, CT PE study showed no evidence of PE but showed hilar mass concerning for metastatic disease with circumferential effacement of the trachea and SVC along with consolidation of the right middle lobe concerning for malignancy.  He was started on IV antibiotics, also treated with steroids.  Pulmonary and oncology were consulted.  IR was subsequently consulted.  He underwent left supraclavicular lymph node biopsy by IR on 01/11/2023.  Assessment & Plan:   Lung mass Mediastinal and hilar lymphadenopathy with mass effect including luminal effacement of trachea, SVC and pulmonary arteries Hepatic lesions -Patient probably has metastatic disease.  Oncology evaluation appreciated: Oncology wants the patient to remain inpatient till pathology results. -underwent left supraclavicular lymph node biopsy by IR on 01/11/2023: Pathology pending.  COPD Pulmonary fibrosis -Pulmonary evaluation appreciated.  Pulmonary has signed off.  Outpatient follow-up with pulmonary.  Continue current neb regimen.  DC Rocephin .  Continue prednisone .  Hypertension -Blood pressure intermittently on the lower side.  Continue metoprolol , clonidine .  Losartan  on hold  Hyperlipidemia -Continue statin  History of PVCs Paroxysmal A-fib with RVR -Has had issues with paroxysmal A-fib with RVR during this hospitalization.  Continue metoprolol  twice a day for now.  If needed, might have to go up on the dose of metoprolol .  Holding off on anticoagulation for now.  Acute kidney injury -Improved.  Creatinine pending today.  Leukocytosis -Possibly reactive.  Monitor  Thrombocytopenia -Resolved  Anemia of chronic disease -Possibly from chronic illnesses.   Hemoglobin stable.  Monitor intermittently  Hyponatremia -Resolved  Hypomagnesemia -Resolved   DVT prophylaxis: Lovenox  Code Status: Full Family Communication: None at bedside Disposition Plan: Status is: Inpatient Remains inpatient appropriate because: Of severity of illness.    Consultants: Pulmonary/oncology/IR  Procedures: As above  Antimicrobials:  Anti-infectives (From admission, onward)    Start     Dose/Rate Route Frequency Ordered Stop   01/06/23 2300  cefTRIAXone  (ROCEPHIN ) 2 g in sodium chloride  0.9 % 100 mL IVPB        2 g 200 mL/hr over 30 Minutes Intravenous Every 24 hours 01/06/23 2252     01/06/23 1545  azithromycin  (ZITHROMAX ) 500 mg in sodium chloride  0.9 % 250 mL IVPB        500 mg 250 mL/hr over 60 Minutes Intravenous  Once 01/06/23 1532 01/06/23 1725        Subjective: Patient seen and examined at bedside.  Wants to go home.  Denies worsening shortness of breath, fever or vomiting.  Heart rate went to the 130s this morning as per the nursing staff.  Objective: Vitals:   01/11/23 1944 01/11/23 2009 01/11/23 2135 01/12/23 0446  BP: 116/61  119/88 (!) 152/74  Pulse:   87 94  Resp:      Temp: 97.6 F (36.4 C)   97.6 F (36.4 C)  TempSrc: Oral   Oral  SpO2: 100% 100%  91%  Weight:      Height:        Intake/Output Summary (Last 24 hours) at 01/12/2023 1017 Last data filed at 01/12/2023 0842 Gross per 24 hour  Intake 240 ml  Output 575 ml  Net -335 ml   Filed Weights   01/06/23 1815 01/06/23 2027  Weight: 59.9 kg 60.1 kg  Examination:  General exam: Appears calm and comfortable.  Looks chronically ill and deconditioned.  On room air Respiratory system: Bilateral decreased breath sounds at bases with some scattered crackles Cardiovascular system: S1 & S2 heard, mild intermittent tachycardia present gastrointestinal system: Abdomen is nondistended, soft and nontender. Normal bowel sounds heard. Extremities: No cyanosis, clubbing,  edema  Central nervous system: Alert and oriented. No focal neurological deficits. Moving extremities Skin: No rashes, lesions or ulcers Psychiatry: Flat affect.  Not agitated.  Looks intermittently anxious.   Data Reviewed: I have personally reviewed following labs and imaging studies  CBC: Recent Labs  Lab 01/06/23 1347 01/07/23 0343 01/08/23 0345 01/11/23 0424  WBC 9.4 5.4 13.7* 12.9*  HGB 10.1* 9.0* 9.9* 9.9*  HCT 30.2* 28.9* 30.1* 31.1*  MCV 92.9 97.3 95.0 98.1  PLT 161 145* 161 182   Basic Metabolic Panel: Recent Labs  Lab 01/06/23 1347 01/07/23 0343 01/08/23 0345 01/09/23 0646 01/11/23 0842  NA 132* 129* 127* 135  --   K 4.2 4.4 4.3 4.0  --   CL 98 98 98 104  --   CO2 25 19* 19* 24  --   GLUCOSE 119* 281* 129* 87  --   BUN 20 23 26* 23  --   CREATININE 1.33* 1.29* 1.01 1.16  --   CALCIUM  10.4* 9.6 9.8 10.3  --   MG  --   --   --  1.6* 2.0   GFR: Estimated Creatinine Clearance: 51.1 mL/min (by C-G formula based on SCr of 1.16 mg/dL). Liver Function Tests: Recent Labs  Lab 01/08/23 0345 01/09/23 0646  AST 22 19  ALT 16 15  ALKPHOS 95 93  BILITOT 0.2 0.4  PROT 7.0 7.2  ALBUMIN 2.5* 2.6*   No results for input(s): LIPASE, AMYLASE in the last 168 hours. No results for input(s): AMMONIA in the last 168 hours. Coagulation Profile: Recent Labs  Lab 01/08/23 1037  INR 1.0   Cardiac Enzymes: No results for input(s): CKTOTAL, CKMB, CKMBINDEX, TROPONINI in the last 168 hours. BNP (last 3 results) No results for input(s): PROBNP in the last 8760 hours. HbA1C: No results for input(s): HGBA1C in the last 72 hours. CBG: No results for input(s): GLUCAP in the last 168 hours. Lipid Profile: No results for input(s): CHOL, HDL, LDLCALC, TRIG, CHOLHDL, LDLDIRECT in the last 72 hours. Thyroid  Function Tests: No results for input(s): TSH, T4TOTAL, FREET4, T3FREE, THYROIDAB in the last 72 hours. Anemia Panel: No  results for input(s): VITAMINB12, FOLATE, FERRITIN, TIBC, IRON, RETICCTPCT in the last 72 hours. Sepsis Labs: No results for input(s): PROCALCITON, LATICACIDVEN in the last 168 hours.  Recent Results (from the past 240 hours)  Resp panel by RT-PCR (RSV, Flu A&B, Covid) Anterior Nasal Swab     Status: None   Collection Time: 01/06/23  1:46 PM   Specimen: Anterior Nasal Swab  Result Value Ref Range Status   SARS Coronavirus 2 by RT PCR NEGATIVE NEGATIVE Final    Comment: (NOTE) SARS-CoV-2 target nucleic acids are NOT DETECTED.  The SARS-CoV-2 RNA is generally detectable in upper respiratory specimens during the acute phase of infection. The lowest concentration of SARS-CoV-2 viral copies this assay can detect is 138 copies/mL. A negative result does not preclude SARS-Cov-2 infection and should not be used as the sole basis for treatment or other patient management decisions. A negative result may occur with  improper specimen collection/handling, submission of specimen other than nasopharyngeal swab, presence of viral mutation(s) within the areas  targeted by this assay, and inadequate number of viral copies(<138 copies/mL). A negative result must be combined with clinical observations, patient history, and epidemiological information. The expected result is Negative.  Fact Sheet for Patients:  bloggercourse.com  Fact Sheet for Healthcare Providers:  seriousbroker.it  This test is no t yet approved or cleared by the [place]  FDA and  has been authorized for detection and/or diagnosis of SARS-CoV-2 by FDA under an Emergency Use Authorization (EUA). This EUA will remain  in effect (meaning this test can be used) for the duration of the COVID-19 declaration under Section 564(b)(1) of the Act, 21 U.S.C.section 360bbb-3(b)(1), unless the authorization is terminated  or revoked sooner.       Influenza A by PCR  NEGATIVE NEGATIVE Final   Influenza B by PCR NEGATIVE NEGATIVE Final    Comment: (NOTE) The Xpert Xpress SARS-CoV-2/FLU/RSV plus assay is intended as an aid in the diagnosis of influenza from Nasopharyngeal swab specimens and should not be used as a sole basis for treatment. Nasal washings and aspirates are unacceptable for Xpert Xpress SARS-CoV-2/FLU/RSV testing.  Fact Sheet for Patients: bloggercourse.com  Fact Sheet for Healthcare Providers: seriousbroker.it  This test is not yet approved or cleared by the [place]  FDA and has been authorized for detection and/or diagnosis of SARS-CoV-2 by FDA under an Emergency Use Authorization (EUA). This EUA will remain in effect (meaning this test can be used) for the duration of the COVID-19 declaration under Section 564(b)(1) of the Act, 21 U.S.C. section 360bbb-3(b)(1), unless the authorization is terminated or revoked.     Resp Syncytial Virus by PCR NEGATIVE NEGATIVE Final    Comment: (NOTE) Fact Sheet for Patients: bloggercourse.com  Fact Sheet for Healthcare Providers: seriousbroker.it  This test is not yet approved or cleared by the [place]  FDA and has been authorized for detection and/or diagnosis of SARS-CoV-2 by FDA under an Emergency Use Authorization (EUA). This EUA will remain in effect (meaning this test can be used) for the duration of the COVID-19 declaration under Section 564(b)(1) of the Act, 21 U.S.C. section 360bbb-3(b)(1), unless the authorization is terminated or revoked.  Performed at [facility], [address], [place], [place] [address]          Radiology Studies: US  CORE BIOPSY (LYMPH NODES) Result Date: 01/11/2023 INDICATION: No known primary, now with concern for metastatic small-cell lung cancer. Please perform ultrasound-guided liver or lymph node biopsy for tissue  diagnostic purposes as indicated. EXAM: ULTRASOUND-GUIDED LEFT SUPRACLAVICULAR LYMPH NODE BIOPSY COMPARISON:  Chest CT-01/06/2023; CT abdomen pelvis-01/07/2023 MEDICATIONS: None ANESTHESIA/SEDATION: Moderate (conscious) sedation was employed during this procedure. A total of Versed  2 mg and Fentanyl  100 mcg was administered intravenously. Moderate Sedation Time: 10 minutes. The patient's level of consciousness and vital signs were monitored continuously by radiology nursing throughout the procedure under my direct supervision. COMPLICATIONS: None immediate. TECHNIQUE: Informed written consent was obtained from the patient after a discussion of the risks, benefits and alternatives to treatment. Questions regarding the procedure were encouraged and answered. Initial ultrasound scanning demonstrated an at least 4.8 x 4.3 x 3.6 cm hypoechoic mass within the central aspect of the right lobe of the liver, correlating with the mass seen on preceding abdominal CT image 19, series 2. 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 An abnormal appearing left supraclavicular lymph node measuring at least 2.1 x 1.4 cm was identified within the left supraclavicular fossa (image 15), and thus was targeted for biopsy. Multiple ultrasound images were saved procedural documentation purposes. The procedure was planned. A timeout was performed prior to the initiation of the procedure. The operative was prepped and draped in the usual sterile fashion, and a sterile drape was applied covering the operative field. A timeout was performed prior to the  initiation of the procedure. Local anesthesia was provided with 1% lidocaine  with epinephrine . Under direct ultrasound guidance, an 18 gauge core needle device was utilized to obtain to obtain 6 core needle biopsies of the indeterminate left supraclavicular fossa. The samples were placed in saline and submitted to pathology. The needle was removed and superficial hemostasis was achieved with manual compression. Post procedure scan was negative for significant hematoma. A dressing was applied. The patient tolerated the procedure well without immediate postprocedural complication. IMPRESSION: Technically successful ultrasound guided biopsy of dominant indeterminate left supraclavicular lymph node. 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 Electronically Signed   By: Norleen Roulette M.D.   On: 01/11/2023 12:10        Scheduled Meds:  arformoterol   15 mcg Nebulization BID   atorvastatin   20 mg Oral Daily   budesonide  (PULMICORT ) nebulizer solution  0.25 mg Nebulization BID   cloNIDine   0.1 mg Oral TID   enoxaparin  (LOVENOX ) injection  40 mg Subcutaneous Q24H   feeding supplement  1 Container Oral TID BM   ipratropium-albuterol   3 mL Nebulization BID   metoprolol  tartrate  25 mg Oral BID   multivitamin with minerals  1 tablet Oral Daily   predniSONE   20 mg Oral Q breakfast   Continuous Infusions:  cefTRIAXone  (ROCEPHIN )  IV 2 g (01/11/23 2310)          Sophie Mao, MD [facility] 01/12/2023, 10:17 AM

## 2023-01-12 NOTE — Progress Notes (Addendum)
 ADM: COPD HOME REGIMEN: ALBUTEROL  INHALER PRN CURRENT THERAPY: BROVANA  BID, PULMICORT  BID, DUONEB BID, DUONEB Q4 PRN PLAN: BROVANA  BID, PULMICORT  BID, DUONEB Q4 PRN ASSESSMENT: NO WOB, NO SOB, PT ON RA, VITALS STABLE, NPC, PT ASKING TO BE D/C.  X-RAY: CONCERNING FOR METASTATIC DISEASE RT CHANGED THERAPY BASED ON PT CONDITION. PT BEEN HERE FOR 6 DAYS.

## 2023-01-12 NOTE — Plan of Care (Signed)
   Problem: Education: Goal: Knowledge of General Education information will improve Description: Including pain rating scale, medication(s)/side effects and non-pharmacologic comfort measures Outcome: Progressing   Problem: Safety: Goal: Ability to remain free from injury will improve Outcome: Progressing   Problem: Skin Integrity: Goal: Risk for impaired skin integrity will decrease Outcome: Progressing

## 2023-01-13 DIAGNOSIS — J441 Chronic obstructive pulmonary disease with (acute) exacerbation: Secondary | ICD-10-CM | POA: Diagnosis not present

## 2023-01-13 LAB — CBC WITH DIFFERENTIAL/PLATELET
Abs Immature Granulocytes: 0.26 10*3/uL — ABNORMAL HIGH (ref 0.00–0.07)
Basophils Absolute: 0 10*3/uL (ref 0.0–0.1)
Basophils Relative: 0 %
Eosinophils Absolute: 0.4 10*3/uL (ref 0.0–0.5)
Eosinophils Relative: 3 %
HCT: 30.7 % — ABNORMAL LOW (ref 39.0–52.0)
Hemoglobin: 10 g/dL — ABNORMAL LOW (ref 13.0–17.0)
Immature Granulocytes: 2 %
Lymphocytes Relative: 20 %
Lymphs Abs: 2.6 10*3/uL (ref 0.7–4.0)
MCH: 31.3 pg (ref 26.0–34.0)
MCHC: 32.6 g/dL (ref 30.0–36.0)
MCV: 96.2 fL (ref 80.0–100.0)
Monocytes Absolute: 0.9 10*3/uL (ref 0.1–1.0)
Monocytes Relative: 7 %
Neutro Abs: 8.6 10*3/uL — ABNORMAL HIGH (ref 1.7–7.7)
Neutrophils Relative %: 68 %
Platelets: 175 10*3/uL (ref 150–400)
RBC: 3.19 MIL/uL — ABNORMAL LOW (ref 4.22–5.81)
RDW: 15.4 % (ref 11.5–15.5)
WBC: 12.9 10*3/uL — ABNORMAL HIGH (ref 4.0–10.5)
nRBC: 0 % (ref 0.0–0.2)

## 2023-01-13 LAB — BASIC METABOLIC PANEL
Anion gap: 7 (ref 5–15)
BUN: 18 mg/dL (ref 8–23)
CO2: 24 mmol/L (ref 22–32)
Calcium: 9.8 mg/dL (ref 8.9–10.3)
Chloride: 103 mmol/L (ref 98–111)
Creatinine, Ser: 0.76 mg/dL (ref 0.61–1.24)
GFR, Estimated: >60 mL/min (ref >60)
Glucose, Bld: 113 mg/dL — ABNORMAL HIGH (ref 70–99)
Potassium: 3.9 mmol/L (ref 3.5–5.1)
Sodium: 134 mmol/L — ABNORMAL LOW (ref 135–145)

## 2023-01-13 LAB — MAGNESIUM: Magnesium: 1.7 mg/dL (ref 1.7–2.4)

## 2023-01-13 NOTE — Progress Notes (Signed)
 Mobility Specialist - Progress Note  (RA) Pre-mobility: 96 bpm HR, 100% SpO2 During mobility: 115 bpm HR, 93% SpO2 Post-mobility: 108 bpm HR, 96% SPO2   01/13/23 1515  Mobility  Activity Ambulated with assistance in hallway  Level of Assistance Contact guard assist, steadying assist  Assistive Device Other (Comment) (HHA)  Distance Ambulated (ft) 50 ft  Range of Motion/Exercises Active  Activity Response Tolerated well  Mobility Referral Yes  Mobility visit 1 Mobility  Mobility Specialist Start Time (ACUTE ONLY) 1500  Mobility Specialist Stop Time (ACUTE ONLY) 1515  Mobility Specialist Time Calculation (min) (ACUTE ONLY) 15 min   Pt was found in bed and agreeable to ambulate. No complaints with session. At EOS returned to bed with all needs met. Call bell in reach.  Erminio Leos Mobility Specialist

## 2023-01-13 NOTE — Plan of Care (Signed)

## 2023-01-13 NOTE — Plan of Care (Signed)

## 2023-01-13 NOTE — Progress Notes (Addendum)
 PROGRESS NOTE    Scott Lindsey  FMW:[id number] DOB: Jun 04, 1955 DOA: 01/06/2023 PCP: Joshua Debby CROME, MD   Brief Narrative:  68 y.o. male with medical history significant of tobacco usage, hyperlipidemia presented with shortness of breath and cough.  On presentation, CT PE study showed no evidence of PE but showed hilar mass concerning for metastatic disease with circumferential effacement of the trachea and SVC along with consolidation of the right middle lobe concerning for malignancy.  He was started on IV antibiotics, also treated with steroids.  Pulmonary and oncology were consulted.  IR was subsequently consulted.  He underwent left supraclavicular lymph node biopsy by IR on 01/11/2023.  Assessment & Plan:   Lung mass Mediastinal and hilar lymphadenopathy with mass effect including luminal effacement of trachea, SVC and pulmonary arteries Hepatic lesions -Patient probably has metastatic disease.  Oncology evaluation appreciated: Oncology wants the patient to remain inpatient till pathology results. -underwent left supraclavicular lymph node biopsy by IR on 01/11/2023: Pathology pending.  COPD Pulmonary fibrosis -Pulmonary evaluation appreciated.  Pulmonary has signed off.  Outpatient follow-up with pulmonary.  Continue current neb regimen.  DC'd Rocephin  on 01/12/2023. Continue prednisone  for 5 days.  Hypertension -Blood pressure currently stable.  Continue metoprolol , clonidine .  Losartan  on hold  Hyperlipidemia -Continue statin  History of PVCs Paroxysmal A-fib with RVR -Has had issues with paroxysmal A-fib with RVR during this hospitalization.  Increased metoprolol  to 50 mg twice a day on 01/12/2023.  Holding off on anticoagulation for now.  Acute kidney injury -Resolved  Leukocytosis -Possibly reactive.  Monitor intermittently  Thrombocytopenia -Resolved  Anemia of chronic disease -Possibly from chronic illnesses.  Hemoglobin stable.  Monitor  intermittently  Hyponatremia -Mild.  Encourage oral intake.  Monitor  Hypomagnesemia -Resolved  Severe malnutrition -Follow nutrition recommendations  DVT prophylaxis: Lovenox  Code Status: Full Family Communication: None at bedside Disposition Plan: Status is: Inpatient Remains inpatient appropriate because: Of severity of illness.    Consultants: Pulmonary/oncology/IR  Procedures: As above  Antimicrobials:  Anti-infectives (From admission, onward)    Start     Dose/Rate Route Frequency Ordered Stop   01/06/23 2300  cefTRIAXone  (ROCEPHIN ) 2 g in sodium chloride  0.9 % 100 mL IVPB  Status:  Discontinued        2 g 200 mL/hr over 30 Minutes Intravenous Every 24 hours 01/06/23 2252 01/12/23 1122   01/06/23 1545  azithromycin  (ZITHROMAX ) 500 mg in sodium chloride  0.9 % 250 mL IVPB        500 mg 250 mL/hr over 60 Minutes Intravenous  Once 01/06/23 1532 01/06/23 1725        Subjective: Patient seen and examined at bedside.  No chest pain, fever, vomiting reported.  Feels slightly better.  Denies worsening shortness of breath  objective: Vitals:   01/12/23 2137 01/13/23 0405 01/13/23 0814 01/13/23 0826  BP: 109/60 130/76    Pulse: (!) 52 96    Resp:      Temp:  98 F (36.7 C)    TempSrc:  Oral    SpO2:  96% 100% 100%  Weight:  63.9 kg    Height:        Intake/Output Summary (Last 24 hours) at 01/13/2023 0952 Last data filed at 01/13/2023 0730 Gross per 24 hour  Intake --  Output 600 ml  Net -600 ml   Filed Weights   01/06/23 1815 01/06/23 2027 01/13/23 0405  Weight: 59.9 kg 60.1 kg 63.9 kg    Examination:  General: Currently on room  air.  No distress.  Chronically ill and deconditioned looking ENT/neck: No thyromegaly.  JVD is not elevated  respiratory: Decreased breath sounds at bases bilaterally with some crackles; no wheezing  CVS: S1-S2 heard, rate controlled currently Abdominal: Soft, nontender, slightly distended; no organomegaly, bowel sounds are  heard Extremities: Trace lower extremity edema; no cyanosis  CNS: Awake and alert.  Slow to respond.  Poor historian.  No focal neurologic deficit.  Moves extremities Lymph: No obvious lymphadenopathy Skin: No obvious ecchymosis/lesions  psych: Mostly flat affect.  Not agitated currently. musculoskeletal: No obvious joint swelling/deformity   Data Reviewed: I have personally reviewed following labs and imaging studies  CBC: Recent Labs  Lab 01/06/23 1347 01/07/23 0343 01/08/23 0345 01/11/23 0424 01/13/23 0456  WBC 9.4 5.4 13.7* 12.9* 12.9*  NEUTROABS  --   --   --   --  8.6*  HGB 10.1* 9.0* 9.9* 9.9* 10.0*  HCT 30.2* 28.9* 30.1* 31.1* 30.7*  MCV 92.9 97.3 95.0 98.1 96.2  PLT 161 145* 161 182 175   Basic Metabolic Panel: Recent Labs  Lab 01/06/23 1347 01/07/23 0343 01/08/23 0345 01/09/23 0646 01/11/23 0842 01/13/23 0456  NA 132* 129* 127* 135  --  134*  K 4.2 4.4 4.3 4.0  --  3.9  CL 98 98 98 104  --  103  CO2 25 19* 19* 24  --  24  GLUCOSE 119* 281* 129* 87  --  113*  BUN 20 23 26* 23  --  18  CREATININE 1.33* 1.29* 1.01 1.16  --  0.76  CALCIUM  10.4* 9.6 9.8 10.3  --  9.8  MG  --   --   --  1.6* 2.0 1.7   GFR: Estimated Creatinine Clearance: 78.8 mL/min (by C-G formula based on SCr of 0.76 mg/dL). Liver Function Tests: Recent Labs  Lab 01/08/23 0345 01/09/23 0646  AST 22 19  ALT 16 15  ALKPHOS 95 93  BILITOT 0.2 0.4  PROT 7.0 7.2  ALBUMIN 2.5* 2.6*   No results for input(s): LIPASE, AMYLASE in the last 168 hours. No results for input(s): AMMONIA in the last 168 hours. Coagulation Profile: Recent Labs  Lab 01/08/23 1037  INR 1.0   Cardiac Enzymes: No results for input(s): CKTOTAL, CKMB, CKMBINDEX, TROPONINI in the last 168 hours. BNP (last 3 results) No results for input(s): PROBNP in the last 8760 hours. HbA1C: No results for input(s): HGBA1C in the last 72 hours. CBG: No results for input(s): GLUCAP in the last 168  hours. Lipid Profile: No results for input(s): CHOL, HDL, LDLCALC, TRIG, CHOLHDL, LDLDIRECT in the last 72 hours. Thyroid  Function Tests: No results for input(s): TSH, T4TOTAL, FREET4, T3FREE, THYROIDAB in the last 72 hours. Anemia Panel: No results for input(s): VITAMINB12, FOLATE, FERRITIN, TIBC, IRON, RETICCTPCT in the last 72 hours. Sepsis Labs: No results for input(s): PROCALCITON, LATICACIDVEN in the last 168 hours.  Recent Results (from the past 240 hours)  Resp panel by RT-PCR (RSV, Flu A&B, Covid) Anterior Nasal Swab     Status: None   Collection Time: 01/06/23  1:46 PM   Specimen: Anterior Nasal Swab  Result Value Ref Range Status   SARS Coronavirus 2 by RT PCR NEGATIVE NEGATIVE Final    Comment: (NOTE) SARS-CoV-2 target nucleic acids are NOT DETECTED.  The SARS-CoV-2 RNA is generally detectable in upper respiratory specimens during the acute phase of infection. The lowest concentration of SARS-CoV-2 viral copies this assay can detect is 138 copies/mL. A negative result  does not preclude SARS-Cov-2 infection and should not be used as the sole basis for treatment or other patient management decisions. A negative result may occur with  improper specimen collection/handling, submission of specimen other than nasopharyngeal swab, presence of viral mutation(s) within the areas targeted by this assay, and inadequate number of viral copies(<138 copies/mL). A negative result must be combined with clinical observations, patient history, and epidemiological information. The expected result is Negative.  Fact Sheet for Patients:  bloggercourse.com  Fact Sheet for Healthcare Providers:  seriousbroker.it  This test is no t yet approved or cleared by the [place]  FDA and  has been authorized for detection and/or diagnosis of SARS-CoV-2 by FDA under an Emergency Use Authorization (EUA). This  EUA will remain  in effect (meaning this test can be used) for the duration of the COVID-19 declaration under Section 564(b)(1) of the Act, 21 U.S.C.section 360bbb-3(b)(1), unless the authorization is terminated  or revoked sooner.       Influenza A by PCR NEGATIVE NEGATIVE Final   Influenza B by PCR NEGATIVE NEGATIVE Final    Comment: (NOTE) The Xpert Xpress SARS-CoV-2/FLU/RSV plus assay is intended as an aid in the diagnosis of influenza from Nasopharyngeal swab specimens and should not be used as a sole basis for treatment. Nasal washings and aspirates are unacceptable for Xpert Xpress SARS-CoV-2/FLU/RSV testing.  Fact Sheet for Patients: bloggercourse.com  Fact Sheet for Healthcare Providers: seriousbroker.it  This test is not yet approved or cleared by the [place]  FDA and has been authorized for detection and/or diagnosis of SARS-CoV-2 by FDA under an Emergency Use Authorization (EUA). This EUA will remain in effect (meaning this test can be used) for the duration of the COVID-19 declaration under Section 564(b)(1) of the Act, 21 U.S.C. section 360bbb-3(b)(1), unless the authorization is terminated or revoked.     Resp Syncytial Virus by PCR NEGATIVE NEGATIVE Final    Comment: (NOTE) Fact Sheet for Patients: bloggercourse.com  Fact Sheet for Healthcare Providers: seriousbroker.it  This test is not yet approved or cleared by the [place]  FDA and has been authorized for detection and/or diagnosis of SARS-CoV-2 by FDA under an Emergency Use Authorization (EUA). This EUA will remain in effect (meaning this test can be used) for the duration of the COVID-19 declaration under Section 564(b)(1) of the Act, 21 U.S.C. section 360bbb-3(b)(1), unless the authorization is terminated or revoked.  Performed at [facility], [address], [place], [place] [address]          Radiology Studies: US  CORE BIOPSY (LYMPH NODES) Result Date: 01/11/2023 INDICATION: No known primary, now with concern for metastatic small-cell lung cancer. Please perform ultrasound-guided liver or lymph node biopsy for tissue diagnostic purposes as indicated. EXAM: ULTRASOUND-GUIDED LEFT SUPRACLAVICULAR LYMPH NODE BIOPSY COMPARISON:  Chest CT-01/06/2023; CT abdomen pelvis-01/07/2023 MEDICATIONS: None ANESTHESIA/SEDATION: Moderate (conscious) sedation was employed during this procedure. A total of Versed  2 mg and Fentanyl  100 mcg was administered intravenously. Moderate Sedation Time: 10 minutes. The patient's level of consciousness and vital signs were monitored continuously by radiology nursing throughout the procedure under my direct supervision. COMPLICATIONS: None immediate. TECHNIQUE: Informed written consent was obtained from the patient after a discussion of the risks, benefits and alternatives to treatment. Questions regarding the procedure were encouraged and answered. Initial ultrasound scanning demonstrated an at least 4.8 x 4.3 x 3.6 cm hypoechoic mass within the central aspect of the right lobe of the liver, correlating with the mass seen on preceding abdominal CT image  19, series 2. 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 An abnormal appearing left supraclavicular lymph node measuring at least 2.1 x 1.4 cm was identified within the left supraclavicular fossa (image 15), and thus was targeted for biopsy. Multiple ultrasound images were saved procedural  documentation purposes. The procedure was planned. A timeout was performed prior to the initiation of the procedure. The operative was prepped and draped in the usual sterile fashion, and a sterile drape was applied covering the operative field. A timeout was performed prior to the initiation of the procedure. Local anesthesia was provided with 1% lidocaine  with epinephrine . Under direct ultrasound guidance, an 18 gauge core needle device was utilized to obtain to obtain 6 core needle biopsies of the indeterminate left supraclavicular fossa. The samples were placed in saline and submitted to pathology. The needle was removed and superficial hemostasis was achieved with manual compression. Post procedure scan was negative for significant hematoma. A dressing was applied. The patient tolerated the procedure well without immediate postprocedural complication. IMPRESSION: Technically successful ultrasound guided biopsy of dominant indeterminate left supraclavicular lymph node. 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 Electronically Signed   By: Norleen Roulette M.D.   On: 01/11/2023 12:10        Scheduled Meds:  arformoterol   15 mcg Nebulization BID   atorvastatin   20 mg Oral Daily   budesonide  (PULMICORT ) nebulizer solution  0.25 mg Nebulization BID   cloNIDine   0.1 mg Oral TID   enoxaparin  (LOVENOX ) injection  40 mg Subcutaneous Q24H   feeding supplement  1 Container Oral TID BM   metoprolol  tartrate  50 mg Oral BID   multivitamin with minerals  1 tablet Oral Daily   predniSONE   20 mg Oral Q breakfast   Continuous Infusions:          Sophie Mao, MD [facility] 01/13/2023, 9:52 AM

## 2023-01-14 DIAGNOSIS — E43 Unspecified severe protein-calorie malnutrition: Secondary | ICD-10-CM | POA: Insufficient documentation

## 2023-01-14 DIAGNOSIS — J441 Chronic obstructive pulmonary disease with (acute) exacerbation: Secondary | ICD-10-CM | POA: Diagnosis not present

## 2023-01-14 LAB — CBC WITH DIFFERENTIAL/PLATELET
Abs Immature Granulocytes: 0.27 10*3/uL — ABNORMAL HIGH (ref 0.00–0.07)
Basophils Absolute: 0 10*3/uL (ref 0.0–0.1)
Basophils Relative: 0 %
Eosinophils Absolute: 0.4 10*3/uL (ref 0.0–0.5)
Eosinophils Relative: 3 %
HCT: 30.9 % — ABNORMAL LOW (ref 39.0–52.0)
Hemoglobin: 9.6 g/dL — ABNORMAL LOW (ref 13.0–17.0)
Immature Granulocytes: 2 %
Lymphocytes Relative: 21 %
Lymphs Abs: 2.5 10*3/uL (ref 0.7–4.0)
MCH: 30.7 pg (ref 26.0–34.0)
MCHC: 31.1 g/dL (ref 30.0–36.0)
MCV: 98.7 fL (ref 80.0–100.0)
Monocytes Absolute: 0.9 10*3/uL (ref 0.1–1.0)
Monocytes Relative: 7 %
Neutro Abs: 7.7 10*3/uL (ref 1.7–7.7)
Neutrophils Relative %: 67 %
Platelets: 182 10*3/uL (ref 150–400)
RBC: 3.13 MIL/uL — ABNORMAL LOW (ref 4.22–5.81)
RDW: 15.4 % (ref 11.5–15.5)
WBC: 11.7 10*3/uL — ABNORMAL HIGH (ref 4.0–10.5)
nRBC: 0 % (ref 0.0–0.2)

## 2023-01-14 LAB — BASIC METABOLIC PANEL
Anion gap: 10 (ref 5–15)
BUN: 17 mg/dL (ref 8–23)
CO2: 23 mmol/L (ref 22–32)
Calcium: 9.8 mg/dL (ref 8.9–10.3)
Chloride: 102 mmol/L (ref 98–111)
Creatinine, Ser: 0.77 mg/dL (ref 0.61–1.24)
GFR, Estimated: >60 mL/min (ref >60)
Glucose, Bld: 96 mg/dL (ref 70–99)
Potassium: 3.9 mmol/L (ref 3.5–5.1)
Sodium: 135 mmol/L (ref 135–145)

## 2023-01-14 LAB — MAGNESIUM: Magnesium: 1.8 mg/dL (ref 1.7–2.4)

## 2023-01-14 MED ORDER — SODIUM CHLORIDE 0.9 % IV BOLUS
500.0000 mL | Freq: Once | INTRAVENOUS | Status: AC
  Administered 2023-01-14: 500 mL via INTRAVENOUS

## 2023-01-14 NOTE — Plan of Care (Signed)
   Problem: Elimination: Goal: Will not experience complications related to bowel motility Outcome: Progressing   Problem: Safety: Goal: Ability to remain free from injury will improve Outcome: Progressing   Problem: Skin Integrity: Goal: Risk for impaired skin integrity will decrease Outcome: Progressing

## 2023-01-14 NOTE — Progress Notes (Signed)
   01/14/23 1452  Assess: MEWS Score  Temp 97.7 F (36.5 C)  BP (!) 83/66  MAP (mmHg) 73  Pulse Rate (!) 130  Level of Consciousness Alert  O2 Device Room Air  Assess: MEWS Score  MEWS Temp 0  MEWS Systolic 1  MEWS Pulse 3  MEWS RR 0  MEWS LOC 0  MEWS Score 4  MEWS Score Color Red  Assess: if the MEWS score is Yellow or Red  Were vital signs accurate and taken at a resting state? Yes  Does the patient meet 2 or more of the SIRS criteria? No  Does the patient have a confirmed or suspected source of infection? No  MEWS guidelines implemented  Yes, red  Treat  MEWS Interventions Considered administering scheduled or prn medications/treatments as ordered  Take Vital Signs  Increase Vital Sign Frequency  Red: Q1hr x2, continue Q4hrs until patient remains green for 12hrs  Escalate  MEWS: Escalate Red: Discuss with charge nurse and notify provider. Consider notifying RRT. If remains red for 2 hours consider need for higher level of care  Notify: Charge Nurse/RN  Name of Charge Nurse/RN Notified Rexene, RN  Provider Notification  Provider Name/Title Leotis  Date Provider Notified 01/14/23  Time Provider Notified 1451  Method of Notification Page  Notification Reason Other (Comment) (red MEWS)  Provider response See new orders ( bolus ordered)  Date of Provider Response 01/14/23  Time of Provider Response 1452  Assess: SIRS CRITERIA  SIRS Temperature  0  SIRS Respirations  0  SIRS Pulse 1  SIRS WBC 0  SIRS Score Sum  1

## 2023-01-14 NOTE — Progress Notes (Signed)
 Mobility Specialist - Progress Note  Pre-mobility: 102 bpm HR,  During mobility: 123 bpm HR,  Post-mobility: 105 bpm HR,    01/14/23 1342  Mobility  Activity Ambulated with assistance in hallway;Ambulated with assistance to bathroom  Level of Assistance Contact guard assist, steadying assist (HHA)  Assistive Device Front wheel walker  Distance Ambulated (ft) 120 ft  Range of Motion/Exercises Active  Activity Response Tolerated fair  Mobility Referral Yes  Mobility visit 1 Mobility  Mobility Specialist Start Time (ACUTE ONLY) 1315  Mobility Specialist Stop Time (ACUTE ONLY) 1342  Mobility Specialist Time Calculation (min) (ACUTE ONLY) 27 min   Pt was found in bed and agreeable to ambulate. Returned to bathroom due to incontinent stool during ambulation. At EOS returned to recliner chair with all needs met. Call bell in reach and NT in room. Chair alarm on.  Erminio Leos Mobility Specialist

## 2023-01-14 NOTE — Progress Notes (Signed)
 PROGRESS NOTE    ARISTOTLE LIEB  FMW:[id number] DOB: 1955/08/31 DOA: 01/06/2023 PCP: Joshua Debby CROME, MD   Brief Narrative: This 68 y.o. male with medical history significant of tobacco usage, hyperlipidemia presented with shortness of breath and cough.  On presentation, CT PE study showed no evidence of PE but showed hilar mass concerning for metastatic disease with circumferential effacement of the trachea and SVC along with consolidation of the right middle lobe concerning for malignancy.  He was started on IV antibiotics, also treated with steroids.  Pulmonary and oncology were consulted.  IR was subsequently consulted.  He underwent left supraclavicular lymph node biopsy by IR on 01/11/2023.   Assessment & Plan:   Principal Problem:   COPD (chronic obstructive pulmonary disease) (HCC) Active Problems:   COPD exacerbation (HCC)   Lung mass   Protein-calorie malnutrition, severe  Lung mass with Mediastinal and hilar lymphadenopathy Patient presented with Lymphadenopathy with mass effect including luminal effacement of trachea, SVC and pulmonary arteries, Hepatic lesions -Patient probably has metastatic disease.  Oncology evaluation appreciated:  -Oncology wants the patient to remain inpatient till pathology results. -underwent left supraclavicular lymph node biopsy by IR on 01/11/2023: Pathology pending.   COPD: Pulmonary fibrosis: -Pulmonary evaluation appreciated.  Pulmonary has signed off.   -Outpatient follow-up with pulmonary.  Continue current neb regimen.   -DC'd Rocephin  on 01/12/2023. Continue prednisone  for 5 days.   Hypertension: -Continue metoprolol , clonidine .  Losartan  on hold.   Hyperlipidemia: -Continue statin   History of PVCs Paroxysmal A-fib with RVR: -Has had issues with paroxysmal A-fib with RVR during this hospitalization.   -Increased metoprolol  to 50 mg twice a day on 01/12/2023.  Holding off on anticoagulation for now.   Acute kidney injury: -Resolved    Leukocytosis -Possibly reactive.  Monitor intermittently   Thrombocytopenia -Resolved   Anemia of chronic disease -Possibly from chronic illnesses.  Hemoglobin stable.  Monitor intermittently   Hyponatremia: -Mild.  Encourage oral intake.  Monitor   Hypomagnesemia: -Resolved   Severe malnutrition: -Follow nutrition recommendations  DVT prophylaxis: Lovenox  Code Status:Full code Family Communication: No family at bed side. Disposition Plan:     Status is: Inpatient Remains inpatient appropriate because: of severity of illness.    Consultants:  Pulmonology Oncology IR  Procedures: left supraclavicular lymph node biopsy  Antimicrobials:  Anti-infectives (From admission, onward)    Start     Dose/Rate Route Frequency Ordered Stop   01/06/23 2300  cefTRIAXone  (ROCEPHIN ) 2 g in sodium chloride  0.9 % 100 mL IVPB  Status:  Discontinued        2 g 200 mL/hr over 30 Minutes Intravenous Every 24 hours 01/06/23 2252 01/12/23 1122   01/06/23 1545  azithromycin  (ZITHROMAX ) 500 mg in sodium chloride  0.9 % 250 mL IVPB        500 mg 250 mL/hr over 60 Minutes Intravenous  Once 01/06/23 1532 01/06/23 1725      Subjective: Patient was seen and examined at bedside.  Overnight events noted.   Patient reports feeling better. He denies any concerns.  Objective: Vitals:   01/13/23 1002 01/13/23 2100 01/13/23 2127 01/14/23 0404  BP: 130/76  136/61 (!) 127/54  Pulse: 92  (!) 55 (!) 50  Resp: 16     Temp: 97.7 F (36.5 C)   98.4 F (36.9 C)  TempSrc: Oral   Oral  SpO2: 100% 100%  99%  Weight:    63 kg  Height:        Intake/Output Summary (  Last 24 hours) at 01/14/2023 1423 Last data filed at 01/14/2023 1253 Gross per 24 hour  Intake 480 ml  Output 250 ml  Net 230 ml   Filed Weights   01/06/23 2027 01/13/23 0405 01/14/23 0404  Weight: 60.1 kg 63.9 kg 63 kg    Examination:  General exam: Appears calm and comfortable, deconditioned, not in any acute  distress. Respiratory system: Clear to auscultation. Respiratory effort normal. RR 16 Cardiovascular system: S1 & S2 heard, RRR. No JVD, murmurs, rubs, gallops or clicks.  Gastrointestinal system: Abdomen is non distended, soft and non tender.  Normal bowel sounds heard. Central nervous system: Alert and oriented X 3. No focal neurological deficits. Extremities: No edema, no cyanosis, no clubbing Skin: No rashes, lesions or ulcers Psychiatry: Judgement and insight appear normal. Mood & affect appropriate.     Data Reviewed: I have personally reviewed following labs and imaging studies  CBC: Recent Labs  Lab 01/08/23 0345 01/11/23 0424 01/13/23 0456 01/14/23 0421  WBC 13.7* 12.9* 12.9* 11.7*  NEUTROABS  --   --  8.6* 7.7  HGB 9.9* 9.9* 10.0* 9.6*  HCT 30.1* 31.1* 30.7* 30.9*  MCV 95.0 98.1 96.2 98.7  PLT 161 182 175 182   Basic Metabolic Panel: Recent Labs  Lab 01/08/23 0345 01/09/23 0646 01/11/23 0842 01/13/23 0456 01/14/23 0421  NA 127* 135  --  134* 135  K 4.3 4.0  --  3.9 3.9  CL 98 104  --  103 102  CO2 19* 24  --  24 23  GLUCOSE 129* 87  --  113* 96  BUN 26* 23  --  18 17  CREATININE 1.01 1.16  --  0.76 0.77  CALCIUM  9.8 10.3  --  9.8 9.8  MG  --  1.6* 2.0 1.7 1.8   GFR: Estimated Creatinine Clearance: 77.7 mL/min (by C-G formula based on SCr of 0.77 mg/dL). Liver Function Tests: Recent Labs  Lab 01/08/23 0345 01/09/23 0646  AST 22 19  ALT 16 15  ALKPHOS 95 93  BILITOT 0.2 0.4  PROT 7.0 7.2  ALBUMIN 2.5* 2.6*   No results for input(s): LIPASE, AMYLASE in the last 168 hours. No results for input(s): AMMONIA in the last 168 hours. Coagulation Profile: Recent Labs  Lab 01/08/23 1037  INR 1.0   Cardiac Enzymes: No results for input(s): CKTOTAL, CKMB, CKMBINDEX, TROPONINI in the last 168 hours. BNP (last 3 results) No results for input(s): PROBNP in the last 8760 hours. HbA1C: No results for input(s): HGBA1C in the last 72  hours. CBG: No results for input(s): GLUCAP in the last 168 hours. Lipid Profile: No results for input(s): CHOL, HDL, LDLCALC, TRIG, CHOLHDL, LDLDIRECT in the last 72 hours. Thyroid  Function Tests: No results for input(s): TSH, T4TOTAL, FREET4, T3FREE, THYROIDAB in the last 72 hours. Anemia Panel: No results for input(s): VITAMINB12, FOLATE, FERRITIN, TIBC, IRON, RETICCTPCT in the last 72 hours. Sepsis Labs: No results for input(s): PROCALCITON, LATICACIDVEN in the last 168 hours.  Recent Results (from the past 240 hours)  Resp panel by RT-PCR (RSV, Flu A&B, Covid) Anterior Nasal Swab     Status: None   Collection Time: 01/06/23  1:46 PM   Specimen: Anterior Nasal Swab  Result Value Ref Range Status   SARS Coronavirus 2 by RT PCR NEGATIVE NEGATIVE Final    Comment: (NOTE) SARS-CoV-2 target nucleic acids are NOT DETECTED.  The SARS-CoV-2 RNA is generally detectable in upper respiratory specimens during the acute phase of infection.  The lowest concentration of SARS-CoV-2 viral copies this assay can detect is 138 copies/mL. A negative result does not preclude SARS-Cov-2 infection and should not be used as the sole basis for treatment or other patient management decisions. A negative result may occur with  improper specimen collection/handling, submission of specimen other than nasopharyngeal swab, presence of viral mutation(s) within the areas targeted by this assay, and inadequate number of viral copies(<138 copies/mL). A negative result must be combined with clinical observations, patient history, and epidemiological information. The expected result is Negative.  Fact Sheet for Patients:  bloggercourse.com  Fact Sheet for Healthcare Providers:  seriousbroker.it  This test is no t yet approved or cleared by the [place]  FDA and  has been authorized for detection and/or diagnosis of  SARS-CoV-2 by FDA under an Emergency Use Authorization (EUA). This EUA will remain  in effect (meaning this test can be used) for the duration of the COVID-19 declaration under Section 564(b)(1) of the Act, 21 U.S.C.section 360bbb-3(b)(1), unless the authorization is terminated  or revoked sooner.       Influenza A by PCR NEGATIVE NEGATIVE Final   Influenza B by PCR NEGATIVE NEGATIVE Final    Comment: (NOTE) The Xpert Xpress SARS-CoV-2/FLU/RSV plus assay is intended as an aid in the diagnosis of influenza from Nasopharyngeal swab specimens and should not be used as a sole basis for treatment. Nasal washings and aspirates are unacceptable for Xpert Xpress SARS-CoV-2/FLU/RSV testing.  Fact Sheet for Patients: bloggercourse.com  Fact Sheet for Healthcare Providers: seriousbroker.it  This test is not yet approved or cleared by the [place]  FDA and has been authorized for detection and/or diagnosis of SARS-CoV-2 by FDA under an Emergency Use Authorization (EUA). This EUA will remain in effect (meaning this test can be used) for the duration of the COVID-19 declaration under Section 564(b)(1) of the Act, 21 U.S.C. section 360bbb-3(b)(1), unless the authorization is terminated or revoked.     Resp Syncytial Virus by PCR NEGATIVE NEGATIVE Final    Comment: (NOTE) Fact Sheet for Patients: bloggercourse.com  Fact Sheet for Healthcare Providers: seriousbroker.it  This test is not yet approved or cleared by the [place]  FDA and has been authorized for detection and/or diagnosis of SARS-CoV-2 by FDA under an Emergency Use Authorization (EUA). This EUA will remain in effect (meaning this test can be used) for the duration of the COVID-19 declaration under Section 564(b)(1) of the Act, 21 U.S.C. section 360bbb-3(b)(1), unless the authorization is terminated  or revoked.  Performed at [facility], [address] [place], [place] [address]     Radiology Studies: No results found. Scheduled Meds:  arformoterol   15 mcg Nebulization BID   atorvastatin   20 mg Oral Daily   budesonide  (PULMICORT ) nebulizer solution  0.25 mg Nebulization BID   cloNIDine   0.1 mg Oral TID   enoxaparin  (LOVENOX ) injection  40 mg Subcutaneous Q24H   feeding supplement  1 Container Oral TID BM   metoprolol  tartrate  50 mg Oral BID   multivitamin with minerals  1 tablet Oral Daily   predniSONE   20 mg Oral Q breakfast   Continuous Infusions:   LOS: 8 days    Time spent: 50 mins    Darcel Dawley, MD [facility]   If 7PM-7AM, please contact night-coverage

## 2023-01-15 DIAGNOSIS — J441 Chronic obstructive pulmonary disease with (acute) exacerbation: Secondary | ICD-10-CM | POA: Diagnosis not present

## 2023-01-15 LAB — CBC
HCT: 32.2 % — ABNORMAL LOW (ref 39.0–52.0)
Hemoglobin: 10.1 g/dL — ABNORMAL LOW (ref 13.0–17.0)
MCH: 31 pg (ref 26.0–34.0)
MCHC: 31.4 g/dL (ref 30.0–36.0)
MCV: 98.8 fL (ref 80.0–100.0)
Platelets: 187 10*3/uL (ref 150–400)
RBC: 3.26 MIL/uL — ABNORMAL LOW (ref 4.22–5.81)
RDW: 15.4 % (ref 11.5–15.5)
WBC: 11.1 10*3/uL — ABNORMAL HIGH (ref 4.0–10.5)
nRBC: 0 % (ref 0.0–0.2)

## 2023-01-15 NOTE — Plan of Care (Signed)
  Problem: Clinical Measurements: Goal: Ability to maintain clinical measurements within normal limits will improve Outcome: Progressing Goal: Will remain free from infection Outcome: Progressing Goal: Diagnostic test results will improve Outcome: Progressing   Problem: Activity: Goal: Risk for activity intolerance will decrease Outcome: Progressing   Problem: Nutrition: Goal: Adequate nutrition will be maintained Outcome: Progressing   Problem: Elimination: Goal: Will not experience complications related to urinary retention Outcome: Progressing   Problem: Pain Management: Goal: General experience of comfort will improve Outcome: Progressing

## 2023-01-15 NOTE — Plan of Care (Signed)

## 2023-01-15 NOTE — Progress Notes (Signed)
 PROGRESS NOTE    Scott Lindsey  FMW:[id number] DOB: [birth year] DOA: 01/06/2023 PCP: Joshua Debby CROME, MD   Brief Narrative: This 68 y.o. male with medical history significant of tobacco usage, hyperlipidemia presented with shortness of breath and cough.  On presentation, CT PE study showed no evidence of PE but showed hilar mass concerning for metastatic disease with circumferential effacement of the trachea and SVC along with consolidation of the right middle lobe concerning for malignancy.  He was started on IV antibiotics, also treated with steroids.  Pulmonary and oncology were consulted.  IR was subsequently consulted.  He underwent left supraclavicular lymph node biopsy by IR on 01/11/2023.   Assessment & Plan:   Principal Problem:   COPD (chronic obstructive pulmonary disease) (HCC) Active Problems:   COPD exacerbation (HCC)   Lung mass   Protein-calorie malnutrition, severe  Lung mass with Mediastinal and hilar lymphadenopathy Patient presented with Lymphadenopathy with mass effect including luminal effacement of trachea, SVC and pulmonary arteries, Hepatic lesions -Patient probably has metastatic disease.  Oncology evaluation appreciated:  -Oncology wants the patient to remain inpatient till pathology results. -He underwent left supraclavicular lymph node biopsy by IR on 01/11/2023: Pathology pending.   COPD: Pulmonary fibrosis: -Pulmonary evaluation appreciated.  Pulmonary has signed off.   -Outpatient follow-up with pulmonary. Continue current nebulization regimen.   -DC'd Rocephin  on 01/12/2023. Continue prednisone  for 5 days.   Hypertension: -Continue metoprolol , clonidine .  Losartan  on hold.   Hyperlipidemia: -Continue statin   History of PVCs Paroxysmal A-fib with RVR: -Has had issues with paroxysmal A-fib with RVR during this hospitalization.   -Increased metoprolol  to 50 mg twice a day on 01/12/2023.  Holding off on anticoagulation for now.   Acute kidney  injury: -Resolved   Leukocytosis -Possibly reactive.  Monitor intermittently.   Thrombocytopenia: -Resolved   Anemia of chronic disease: -Possibly from chronic illnesses.  Hemoglobin stable.  Monitor intermittently   Hyponatremia: -Mild.  Encourage oral intake.  Monitor   Hypomagnesemia: -Resolved.   Severe malnutrition: -Follow nutrition recommendations.  DVT prophylaxis: Lovenox  Code Status:Full code Family Communication: No family at bed side. Disposition Plan:     Status is: Inpatient Remains inpatient appropriate because: of severity of illness.    Consultants:  Pulmonology Oncology IR  Procedures: left supraclavicular lymph node biopsy  Antimicrobials:  Anti-infectives (From admission, onward)    Start     Dose/Rate Route Frequency Ordered Stop   01/06/23 2300  cefTRIAXone  (ROCEPHIN ) 2 g in sodium chloride  0.9 % 100 mL IVPB  Status:  Discontinued        2 g 200 mL/hr over 30 Minutes Intravenous Every 24 hours 01/06/23 2252 01/12/23 1122   01/06/23 1545  azithromycin  (ZITHROMAX ) 500 mg in sodium chloride  0.9 % 250 mL IVPB        500 mg 250 mL/hr over 60 Minutes Intravenous  Once 01/06/23 1532 01/06/23 1725      Subjective: Patient was seen and examined at bedside.  Overnight events noted.   Patient reports feeling better, he denies any concerns.  He is awaiting for biopsy results to come back .  Objective: Vitals:   01/15/23 0501 01/15/23 0533 01/15/23 0950 01/15/23 1233  BP: 109/70  (!) 144/62 119/69  Pulse: 85 78  86  Resp: 18   20  Temp: 98 F (36.7 C)   97.8 F (36.6 C)  TempSrc: Oral   Oral  SpO2: 92%   94%  Weight:      Height:  Intake/Output Summary (Last 24 hours) at 01/15/2023 1338 Last data filed at 01/15/2023 0814 Gross per 24 hour  Intake --  Output 600 ml  Net -600 ml   Filed Weights   01/13/23 0405 01/14/23 0404 01/15/23 0500  Weight: 63.9 kg 63 kg 64.1 kg    Examination:  General exam: Appears calm and  comfortable, deconditioned, not in any acute distress. Respiratory system: CTA bilaterally. Respiratory effort normal. RR 16 Cardiovascular system: S1 & S2 heard, RRR. No JVD, murmurs, rubs, gallops or clicks.  Gastrointestinal system: Abdomen is non distended, soft and non tender.  Normal bowel sounds heard. Central nervous system: Alert and oriented X 3. No focal neurological deficits. Extremities: No edema, no cyanosis, no clubbing Skin: No rashes, lesions or ulcers Psychiatry: Judgement and insight appear normal. Mood & affect appropriate.     Data Reviewed: I have personally reviewed following labs and imaging studies  CBC: Recent Labs  Lab 01/11/23 0424 01/13/23 0456 01/14/23 0421 01/15/23 0407  WBC 12.9* 12.9* 11.7* 11.1*  NEUTROABS  --  8.6* 7.7  --   HGB 9.9* 10.0* 9.6* 10.1*  HCT 31.1* 30.7* 30.9* 32.2*  MCV 98.1 96.2 98.7 98.8  PLT 182 175 182 187   Basic Metabolic Panel: Recent Labs  Lab 01/09/23 0646 01/11/23 0842 01/13/23 0456 01/14/23 0421  NA 135  --  134* 135  K 4.0  --  3.9 3.9  CL 104  --  103 102  CO2 24  --  24 23  GLUCOSE 87  --  113* 96  BUN 23  --  18 17  CREATININE 1.16  --  0.76 0.77  CALCIUM  10.3  --  9.8 9.8  MG 1.6* 2.0 1.7 1.8   GFR: Estimated Creatinine Clearance: 79 mL/min (by C-G formula based on SCr of 0.77 mg/dL). Liver Function Tests: Recent Labs  Lab 01/09/23 0646  AST 19  ALT 15  ALKPHOS 93  BILITOT 0.4  PROT 7.2  ALBUMIN 2.6*   No results for input(s): LIPASE, AMYLASE in the last 168 hours. No results for input(s): AMMONIA in the last 168 hours. Coagulation Profile: No results for input(s): INR, PROTIME in the last 168 hours.  Cardiac Enzymes: No results for input(s): CKTOTAL, CKMB, CKMBINDEX, TROPONINI in the last 168 hours. BNP (last 3 results) No results for input(s): PROBNP in the last 8760 hours. HbA1C: No results for input(s): HGBA1C in the last 72 hours. CBG: No results for  input(s): GLUCAP in the last 168 hours. Lipid Profile: No results for input(s): CHOL, HDL, LDLCALC, TRIG, CHOLHDL, LDLDIRECT in the last 72 hours. Thyroid  Function Tests: No results for input(s): TSH, T4TOTAL, FREET4, T3FREE, THYROIDAB in the last 72 hours. Anemia Panel: No results for input(s): VITAMINB12, FOLATE, FERRITIN, TIBC, IRON, RETICCTPCT in the last 72 hours. Sepsis Labs: No results for input(s): PROCALCITON, LATICACIDVEN in the last 168 hours.  Recent Results (from the past 240 hours)  Resp panel by RT-PCR (RSV, Flu A&B, Covid) Anterior Nasal Swab     Status: None   Collection Time: 01/06/23  1:46 PM   Specimen: Anterior Nasal Swab  Result Value Ref Range Status   SARS Coronavirus 2 by RT PCR NEGATIVE NEGATIVE Final    Comment: (NOTE) SARS-CoV-2 target nucleic acids are NOT DETECTED.  The SARS-CoV-2 RNA is generally detectable in upper respiratory specimens during the acute phase of infection. The lowest concentration of SARS-CoV-2 viral copies this assay can detect is 138 copies/mL. A negative result does not preclude  SARS-Cov-2 infection and should not be used as the sole basis for treatment or other patient management decisions. A negative result may occur with  improper specimen collection/handling, submission of specimen other than nasopharyngeal swab, presence of viral mutation(s) within the areas targeted by this assay, and inadequate number of viral copies(<138 copies/mL). A negative result must be combined with clinical observations, patient history, and epidemiological information. The expected result is Negative.  Fact Sheet for Patients:  bloggercourse.com  Fact Sheet for Healthcare Providers:  seriousbroker.it  This test is no t yet approved or cleared by the [place]  FDA and  has been authorized for detection and/or diagnosis of SARS-CoV-2 by FDA under an  Emergency Use Authorization (EUA). This EUA will remain  in effect (meaning this test can be used) for the duration of the COVID-19 declaration under Section 564(b)(1) of the Act, 21 U.S.C.section 360bbb-3(b)(1), unless the authorization is terminated  or revoked sooner.       Influenza A by PCR NEGATIVE NEGATIVE Final   Influenza B by PCR NEGATIVE NEGATIVE Final    Comment: (NOTE) The Xpert Xpress SARS-CoV-2/FLU/RSV plus assay is intended as an aid in the diagnosis of influenza from Nasopharyngeal swab specimens and should not be used as a sole basis for treatment. Nasal washings and aspirates are unacceptable for Xpert Xpress SARS-CoV-2/FLU/RSV testing.  Fact Sheet for Patients: bloggercourse.com  Fact Sheet for Healthcare Providers: seriousbroker.it  This test is not yet approved or cleared by the [place]  FDA and has been authorized for detection and/or diagnosis of SARS-CoV-2 by FDA under an Emergency Use Authorization (EUA). This EUA will remain in effect (meaning this test can be used) for the duration of the COVID-19 declaration under Section 564(b)(1) of the Act, 21 U.S.C. section 360bbb-3(b)(1), unless the authorization is terminated or revoked.     Resp Syncytial Virus by PCR NEGATIVE NEGATIVE Final    Comment: (NOTE) Fact Sheet for Patients: bloggercourse.com  Fact Sheet for Healthcare Providers: seriousbroker.it  This test is not yet approved or cleared by the [place]  FDA and has been authorized for detection and/or diagnosis of SARS-CoV-2 by FDA under an Emergency Use Authorization (EUA). This EUA will remain in effect (meaning this test can be used) for the duration of the COVID-19 declaration under Section 564(b)(1) of the Act, 21 U.S.C. section 360bbb-3(b)(1), unless the authorization is terminated or revoked.  Performed at [facility], [address] [place], [place] [address]     Radiology Studies: No results found. Scheduled Meds:  arformoterol   15 mcg Nebulization BID   atorvastatin   20 mg Oral Daily   budesonide  (PULMICORT ) nebulizer solution  0.25 mg Nebulization BID   cloNIDine   0.1 mg Oral TID   enoxaparin  (LOVENOX ) injection  40 mg Subcutaneous Q24H   feeding supplement  1 Container Oral TID BM   metoprolol  tartrate  50 mg Oral BID   multivitamin with minerals  1 tablet Oral Daily   predniSONE   20 mg Oral Q breakfast   Continuous Infusions:   LOS: 9 days    Time spent: 35 mins    Darcel Dawley, MD [facility]   If 7PM-7AM, please contact night-coverage

## 2023-01-16 ENCOUNTER — Ambulatory Visit
Admit: 2023-01-16 | Discharge: 2023-01-16 | Disposition: A | Discharge status: Home / Self Care | Payer: Medicare Other | Attending: Radiation Oncology | Admitting: Radiation Oncology

## 2023-01-16 ENCOUNTER — Encounter: Payer: Self-pay | Admitting: Radiation Oncology

## 2023-01-16 ENCOUNTER — Other Ambulatory Visit: Payer: Self-pay | Admitting: Radiology

## 2023-01-16 DIAGNOSIS — C342 Malignant neoplasm of middle lobe, bronchus or lung: Secondary | ICD-10-CM | POA: Insufficient documentation

## 2023-01-16 DIAGNOSIS — R918 Other nonspecific abnormal finding of lung field: Secondary | ICD-10-CM

## 2023-01-16 DIAGNOSIS — J441 Chronic obstructive pulmonary disease with (acute) exacerbation: Secondary | ICD-10-CM | POA: Diagnosis not present

## 2023-01-16 LAB — SURGICAL PATHOLOGY

## 2023-01-16 MED ORDER — BUDESONIDE 0.25 MG/2ML IN SUSP: 0.2500 mg | Freq: Two times a day (BID) | RESPIRATORY_TRACT | 12 refills | Status: DC

## 2023-01-16 MED ORDER — METOPROLOL TARTRATE 50 MG PO TABS: 50.0000 mg | ORAL_TABLET | Freq: Two times a day (BID) | ORAL | 0 refills | Status: DC

## 2023-01-16 MED ORDER — OXYCODONE HCL 5 MG PO TABS: 5.0000 mg | ORAL_TABLET | ORAL | 0 refills | Status: AC | PRN

## 2023-01-16 MED ORDER — PREDNISONE 20 MG PO TABS: 20.0000 mg | ORAL_TABLET | Freq: Every day | ORAL | 0 refills | Status: DC

## 2023-01-16 MED ORDER — GUAIFENESIN-DM 100-10 MG/5ML PO SYRP: 5.0000 mL | ORAL_SOLUTION | ORAL | 0 refills | Status: DC | PRN

## 2023-01-16 MED ORDER — ARFORMOTEROL TARTRATE 15 MCG/2ML IN NEBU: 15.0000 ug | INHALATION_SOLUTION | Freq: Two times a day (BID) | RESPIRATORY_TRACT | 0 refills | Status: DC

## 2023-01-16 NOTE — Discharge Instructions (Signed)
 Advised to follow up with PCP in one week. Advised to follow-up with oncology Dr. Arbutus Ped as scheduled. Patient need outpatient PET scan.

## 2023-01-16 NOTE — Progress Notes (Signed)
 PT eating breakfast- scheduled nebulizer treatment not delivered at this time. PT states he is breathing fine. RT will attempt to revisit.

## 2023-01-16 NOTE — Progress Notes (Signed)
 Telemetry update, pt in afib w/ RVR rates up to the 160s. At the time pt was up in the bathroom shaving. Pt asymptomatic, VSS. Scheduled PO metoprolol given. Will continue to monitor HR.

## 2023-01-16 NOTE — Discharge Summary (Signed)
 Physician Discharge Summary  LIONARDO HAZE FMW:[id number] DOB: 05-08-1955 DOA: 01/06/2023  PCP: Joshua Debby CROME, MD  Admit date: 01/06/2023  Discharge date: 01/16/2023  Admitted From: Home.  Disposition:  Home  Recommendations for Outpatient Follow-up:  Follow up with PCP in 1-2 weeks. Please obtain BMP/CBC in one week. Advised to follow-up with oncology Dr. Sherrod as scheduled. Patient need outpatient PET scan.  Home Health: None Equipment/Devices:None  Discharge Condition: Stable CODE STATUS:Full code Diet recommendation: Heart Healthy  Brief [facility] Course: This 68 y.o. male with medical history significant of tobacco usage, hyperlipidemia presented in the ED with shortness of breath and cough.  On presentation, CT PE study showed no evidence of PE but showed hilar mass concerning for metastatic disease with circumferential effacement of the trachea and SVC along with consolidation of the right middle lobe concerning for malignancy.  He was started on IV antibiotics, also treated with steroids.  Pulmonary and oncology were consulted.  IR was subsequently consulted.  He underwent left supraclavicular lymph node biopsy by IR on 01/11/2023.  Lymph node biopsy result confirms squamous cell carcinoma of the lung.  Patient was also seen by radiation therapy and oncologist.  Recommended patient can be discharged and follow-up outpatient for PET scan and follow-up with oncology.  Patient feels better and wants to be discharged.  Discharge Diagnoses:  Principal Problem:   COPD (chronic obstructive pulmonary disease) (HCC) Active Problems:   COPD exacerbation (HCC)   Lung mass   Protein-calorie malnutrition, severe  Lung mass with Mediastinal and hilar lymphadenopathy -Patient presented with Lymphadenopathy with mass effect including luminal effacement of trachea, SVC and pulmonary arteries, Hepatic lesions. -Patient probably has metastatic disease.  Oncology evaluation  appreciated:  -Oncology wants the patient to remain inpatient till pathology results. -He underwent left supraclavicular lymph node biopsy by IR on 01/11/2023: Pathology pending. -Pathology result confirms squamous cell carcinoma. -Oncology recommended outpatient follow-up with oncology and PET scan.   COPD: Pulmonary fibrosis: -Pulmonary evaluation appreciated.  Pulmonary has signed off.   -Outpatient follow-up with pulmonary. Continue current nebulization regimen.   -DC'd Rocephin  on 01/12/2023. Continue prednisone  for 5 days.   Hypertension: -Continue metoprolol , clonidine .  Resume losartan .   Hyperlipidemia: -Continue statin.   History of PVCs Paroxysmal A-fib with RVR: -Has had issues with paroxysmal A-fib with RVR during this hospitalization.   -Increased metoprolol  to 50 mg twice a day on 01/12/2023.  Holding off on anticoagulation for now.   Acute kidney injury: -Resolved   Leukocytosis -Possibly reactive.  Monitor intermittently.   Thrombocytopenia: -Resolved   Anemia of chronic disease: -Possibly from chronic illnesses.  Hemoglobin stable.  Monitor intermittently   Hyponatremia: -Mild.  Encourage oral intake.  Monitor   Hypomagnesemia: -Resolved.   Severe malnutrition: -Follow nutrition recommendations.  Discharge Instructions  Discharge Instructions     Diet - low sodium heart healthy   Complete by: As directed    Increase activity slowly   Complete by: As directed       Allergies as of 01/16/2023   No Known Allergies      Medication List     STOP taking these medications    metoprolol  succinate 25 MG 24 hr tablet Commonly known as: TOPROL -XL   thiamine  100 MG tablet Commonly known as: VITAMIN B1       TAKE these medications    albuterol  108 (90 Base) MCG/ACT inhaler Commonly known as: VENTOLIN  HFA Inhale 2 puffs into the lungs every 6 (six) hours as needed  for wheezing or shortness of breath.   arformoterol  15 MCG/2ML Nebu Commonly  known as: BROVANA  Take 2 mLs (15 mcg total) by nebulization 2 (two) times daily.   atorvastatin  20 MG tablet Commonly known as: LIPITOR Take 1 tablet (20 mg total) by mouth daily.   budesonide  0.25 MG/2ML nebulizer solution Commonly known as: PULMICORT  Take 2 mLs (0.25 mg total) by nebulization 2 (two) times daily.   cloNIDine  0.1 MG tablet Commonly known as: CATAPRES  TAKE 1 TABLET BY MOUTH THREE TIMES DAILY What changed:  when to take this additional instructions   folic acid  1 MG tablet Commonly known as: FOLVITE  Take 1 tablet by mouth once daily   guaiFENesin -dextromethorphan 100-10 MG/5ML syrup Commonly known as: ROBITUSSIN DM Take 5 mLs by mouth every 4 (four) hours as needed for cough.   losartan  100 MG tablet Commonly known as: COZAAR  Take 1 tablet by mouth once daily What changed:  how much to take how to take this when to take this additional instructions   magnesium  oxide 400 MG tablet Commonly known as: MAG-OX Take 1 tablet (400 mg total) by mouth daily. What changed:  how much to take when to take this   metoprolol  tartrate 50 MG tablet Commonly known as: LOPRESSOR  Take 1 tablet (50 mg total) by mouth 2 (two) times daily.   multivitamin Tabs tablet Take 1 tablet by mouth daily with breakfast.   oxyCODONE  5 MG immediate release tablet Commonly known as: Oxy IR/ROXICODONE  Take 1 tablet (5 mg total) by mouth every 4 (four) hours as needed for up to 3 days for moderate pain (pain score 4-6).   predniSONE  20 MG tablet Commonly known as: DELTASONE  Take 1 tablet (20 mg total) by mouth daily with breakfast. Start taking on: January 17, 2023        Follow-up Information     Joshua Debby CROME, MD Follow up in 1 week(s).   Specialty: Internal Medicine Contact information: [address] [place] [address] [phone]         Sherrod Sherrod, MD Follow up in 1 week(s).   Specialty: Oncology Contact information: [address]  [place] [address] [phone]                No Known Allergies  Consultations: Oncology   Procedures/Studies: US  CORE BIOPSY (LYMPH NODES) Result Date: 01/11/2023 INDICATION: No known primary, now with concern for metastatic small-cell lung cancer. Please perform ultrasound-guided liver or lymph node biopsy for tissue diagnostic purposes as indicated. EXAM: ULTRASOUND-GUIDED LEFT SUPRACLAVICULAR LYMPH NODE BIOPSY COMPARISON:  Chest CT-01/06/2023; CT abdomen pelvis-01/07/2023 MEDICATIONS: None ANESTHESIA/SEDATION: Moderate (conscious) sedation was employed during this procedure. A total of Versed  2 mg and Fentanyl  100 mcg was administered intravenously. Moderate Sedation Time: 10 minutes. The patient's level of consciousness and vital signs were monitored continuously by radiology nursing throughout the procedure under my direct supervision. COMPLICATIONS: None immediate. TECHNIQUE: Informed written consent was obtained from the patient after a discussion of the risks, benefits and alternatives to treatment. Questions regarding the procedure were encouraged and answered. Initial ultrasound scanning demonstrated an at least 4.8 x 4.3 x 3.6 cm hypoechoic mass within the central aspect of the right lobe of the liver, correlating with the mass seen on preceding abdominal CT image 19, series 2. Given the central location of the liver lesion as well as the subtotal occlusion of the celiac, the abdominal aorta as well as the bilateral common femoral arteries demonstrated on preceding abdominal CT (rendering  the patient and extremely poor angiography candidate if he were to experience clinically significant postprocedural bleeding), sonographic evaluation was performed of both the right and left neck and supraclavicular fossa. An abnormal appearing left supraclavicular lymph node measuring at least 2.1 x 1.4 cm was identified within the left supraclavicular fossa (image 15), and thus was  targeted for biopsy. Multiple ultrasound images were saved procedural documentation purposes. The procedure was planned. A timeout was performed prior to the initiation of the procedure. The operative was prepped and draped in the usual sterile fashion, and a sterile drape was applied covering the operative field. A timeout was performed prior to the initiation of the procedure. Local anesthesia was provided with 1% lidocaine  with epinephrine . Under direct ultrasound guidance, an 18 gauge core needle device was utilized to obtain to obtain 6 core needle biopsies of the indeterminate left supraclavicular fossa. The samples were placed in saline and submitted to pathology. The needle was removed and superficial hemostasis was achieved with manual compression. Post procedure scan was negative for significant hematoma. A dressing was applied. The patient tolerated the procedure well without immediate postprocedural complication. IMPRESSION: Technically successful ultrasound guided biopsy of dominant indeterminate left supraclavicular lymph node. 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 Electronically Signed   By: Norleen Roulette M.D.   On: 01/11/2023 12:10   MR BRAIN W WO CONTRAST Result Date: 01/09/2023 CLINICAL DATA:  Non-small cell lung carcinoma staging EXAM: MRI HEAD WITHOUT AND WITH CONTRAST TECHNIQUE: Multiplanar, multiecho pulse sequences of the brain and surrounding structures were obtained without and with intravenous contrast. CONTRAST:  6mL GADAVIST  GADOBUTROL  1 MMOL/ML IV SOLN COMPARISON:  06/03/2003 FINDINGS: Brain: No acute infarct, mass effect or extra-axial collection. Chronic microhemorrhage in the left  pons. There is multifocal hyperintense T2-weighted signal within the white matter. Generalized volume loss. The midline structures are normal. There is no abnormal contrast enhancement. Vascular: Normal flow voids. Skull and upper cervical spine: Bilateral mastoid fluid. Paranasal sinuses are clear. Normal orbits. Sinuses/Orbits:No paranasal sinus fluid levels or advanced mucosal thickening. No mastoid or middle ear effusion. Normal orbits. IMPRESSION: 1. No intracranial metastatic disease. 2. Findings of chronic small vessel ischemia and volume loss. Electronically Signed   By: Franky Stanford M.D.   On: 01/09/2023 01:49   US  Abdomen Limited RUQ (LIVER/GB) Result Date: 01/08/2023 CLINICAL DATA:  Central right lobe liver lesion and right lung mass suspicious for malignancy. The patient presents for ultrasound-guided biopsy of the liver lesion. EXAM: ULTRASOUND ABDOMEN LIMITED RIGHT UPPER QUADRANT COMPARISON:  None Available. FINDINGS: Ultrasound was performed in anticipation of performing image guided liver lesion biopsy. A discrete ovoid hypoechoic mass in the central right lobe measures approximately 4.7 x 3.3 x 4.1 cm and is in the deep central right lobe. The biopsy procedure could not be performed as multiple nurses were unsuccessful in establishing durable IV access for the patient to receive IV conscious sedation. The patient had to be sent back to his room for the IV Team to try to establish IV access. The liver biopsy will be rescheduled. IMPRESSION: 4.7 cm central right lobe liver mass. A biopsy procedure could not be performed as multiple nurses were unsuccessful in establishing IV access for the patient to receive IV conscious sedation. The liver biopsy  will be rescheduled. Electronically Signed   By: Marcey Moan M.D.   On: 01/08/2023 16:59   CT ABDOMEN PELVIS W CONTRAST Result Date: 01/07/2023 CLINICAL DATA:  Abnormal CT of the chest 1 day prior. Lymphadenopathy and pulmonary mass. * Tracking  Code: BO * EXAM: CT ABDOMEN AND PELVIS WITH CONTRAST TECHNIQUE: Multidetector CT imaging of the abdomen and pelvis was performed using the standard protocol following bolus administration of intravenous contrast. RADIATION DOSE REDUCTION: This exam was performed according to the departmental dose-optimization program which includes automated exposure control, adjustment of the mA and/or kV according to patient size and/or use of iterative reconstruction technique. CONTRAST:  OMNIPAQUE  IOHEXOL  300 MG/ML  SOLN COMPARISON:  CT chest 01/06/2023 FINDINGS: Lower chest: Rounded 3.6 cm mass in the RIGHT middle lobe (image 8). Mediastinal hilar adenopathy not well appreciated at the base the heart. See CT chest comparison Hepatobiliary: Low-density lesion centrally within liver is hypoenhancing measuring 31 x 26 mm (image 17/2) Pancreas: No biliary duct dilatation. No pancreatic duct dilatation. Body and tail the pancreas are mildly atrophic. Mass lesion identified. Spleen: Normal spleen Adrenals/urinary tract: Adrenal glands and kidneys are normal. The ureters and bladder normal. Stomach/Bowel: Stomach and small bowel normal. There is moderate volume stool throughout the colon without obstructing lesion identified. Vascular/Lymphatic: Dense calcification abdominal aorta. There small periaortic retroperitoneal lymph nodes measuring up to 9 mm (image 39/series 2) no pelvic lymphadenopathy. No inguinal adenopathy Reproductive: Unremarkable Other: No free fluid. Musculoskeletal: No aggressive osseous lesion. IMPRESSION: 1. Hypoenhancing lesion centrally within liver is most consistent with hepatic metastasis. 2. Small periaortic retroperitoneal lymph nodes are concerning for metastasis 3. RIGHT middle lobe mass is concerning for primary lung neoplasm. See CT chest comparison. 4. No evidence of pancreatic mass. 5. There is moderate volume stool throughout the colon without obstructing lesion identified. Cannot exclude  colorectal carcinoma primary but favor lung cancer. 6.  Aortic Atherosclerosis (ICD10-I70.0). Electronically Signed   By: Jackquline Boxer M.D.   On: 01/07/2023 16:15   CT Angio Chest PE W and/or Wo Contrast Result Date: 01/06/2023 CLINICAL DATA:  Shortness of breath on exertion EXAM: CT ANGIOGRAPHY CHEST WITH CONTRAST TECHNIQUE: Multidetector CT imaging of the chest was performed using the standard protocol during bolus administration of intravenous contrast. Multiplanar CT image reconstructions and MIPs were obtained to evaluate the vascular anatomy. RADIATION DOSE REDUCTION: This exam was performed according to the departmental dose-optimization program which includes automated exposure control, adjustment of the mA and/or kV according to patient size and/or use of iterative reconstruction technique. CONTRAST:  75mL OMNIPAQUE  IOHEXOL  350 MG/ML SOLN COMPARISON:  Chest radiograph dated 10/12/2022, CT abdomen and pelvis dated 04/10/2021 FINDINGS: Cardiovascular: The study is high quality for the evaluation of pulmonary embolism. Effacement of the right main pulmonary artery and most notably the right upper lobar pulmonary artery due to bulky mediastinal lymphadenopathy. There are no filling defects in the central, lobar, segmental or subsegmental pulmonary artery branches to suggest acute pulmonary embolism. Great vessels are normal in course and caliber. Normal heart size. No significant pericardial fluid/thickening. Coronary artery calcifications and aortic atherosclerosis. Severe luminal narrowing of the SVC. Reflux of contrast material into the hepatic veins, suggesting a degree of right heart dysfunction. Multiple collaterals within the skin and paraspinal region. Mediastinum/Nodes: Imaged thyroid  gland without nodules meeting criteria for imaging follow-up by size. Normal esophagus. Bulky nodal conglomerate centered at the level of the carina extending into the right hilum spans approximately 8.1 x 4.8 cm  (4:70) and  results in mass effect and circumferential luminal effacement of the trachea, SVC, and pulmonary arteries. 19 mm right anterior mediastinal lymph node (4:70). Lungs/Pleura: Marked effacement of the distal trachea and bilateral proximal bronchi, right-greater-than-left due to mediastinal lymphadenopathy. Small volume adherent secretions within the trachea. Irregular consolidation in the right middle lobe with tethering and traction of the right major fissure measures 4.2 x 3.1 cm (6:112). Superior segment right lower lobe nodule measures 2.3 x 2.2 cm (6:93). Additional peripheral nodule in the right middle lobe measures 0.9 x 0.7 cm (6:106). These appear new compared to 04/10/2021. Background findings of pulmonary fibrosis with right-greater-than-left lower lobe predominant honeycombing and traction bronchiectasis. No pneumothorax. No pleural effusion. Upper abdomen: Ill-defined hypodensity centered within segment 4 measures 4.5 x 3.5 cm (4:142), new from 04/10/2021. Musculoskeletal: No acute or abnormal lytic or blastic osseous lesions. Review of the MIP images confirms the above findings. IMPRESSION: 1. No evidence of pulmonary embolism. 2. Bulky nodal conglomerate centered at the level of the carina extending into the right hilum results in mass effect and circumferential luminal effacement of the trachea, SVC, and pulmonary arteries. Findings are highly suspicious for metastatic disease. 3. Irregular consolidation in the right middle lobe with tethering and traction of the right major fissure measures 4.2 x 3.1 cm, suspicious for malignancy, either primary or metastatic. Additional superior segment right lower lobe nodule and peripheral right middle lobe nodule. These appear new compared to 04/10/2021. 4. Ill-defined hypodensity centered within segment 4 measures 4.5 x 3.5 cm, new from 04/10/2021, suspicious for metastatic disease. 5. Reflux of contrast material into the hepatic veins, suggesting a  degree of right heart dysfunction. 6.  Aortic Atherosclerosis (ICD10-I70.0). Electronically Signed   By: Limin  Xu M.D.   On: 01/06/2023 16:36     Subjective: Patient was seen and examined at bedside.  Overnight events noted.   Patient reports doing much better and  wants to be discharged.  Patient is being discharged home.  Discharge Exam: Vitals:   01/16/23 0959 01/16/23 1245  BP: 129/83 111/78  Pulse: (!) 111 (!) 105  Resp: 20 16  Temp: 98.1 F (36.7 C) (!) 97.3 F (36.3 C)  SpO2: 94% 94%   Vitals:   01/16/23 0258 01/16/23 0918 01/16/23 0959 01/16/23 1245  BP:   129/83 111/78  Pulse:   (!) 111 (!) 105  Resp:   20 16  Temp:   98.1 F (36.7 C) (!) 97.3 F (36.3 C)  TempSrc:   Oral Oral  SpO2:  94% 94% 94%  Weight: 61.2 kg     Height:        General: Pt is alert, awake, not in acute distress Cardiovascular: RRR, S1/S2 +, no rubs, no gallops Respiratory: CTA bilaterally, no wheezing, no rhonchi Abdominal: Soft, NT, ND, bowel sounds + Extremities: no edema, no cyanosis    The results of significant diagnostics from this hospitalization (including imaging, microbiology, ancillary and laboratory) are listed below for reference.     Microbiology: No results found for this or any previous visit (from the past 240 hours).    Labs: BNP (last 3 results) No results for input(s): BNP in the last 8760 hours. Basic Metabolic Panel: Recent Labs  Lab 01/11/23 0842 01/13/23 0456 01/14/23 0421  NA  --  134* 135  K  --  3.9 3.9  CL  --  103 102  CO2  --  24 23  GLUCOSE  --  113* 96  BUN  --  18  17  CREATININE  --  0.76 0.77  CALCIUM   --  9.8 9.8  MG 2.0 1.7 1.8   Liver Function Tests: No results for input(s): AST, ALT, ALKPHOS, BILITOT, PROT, ALBUMIN in the last 168 hours. No results for input(s): LIPASE, AMYLASE in the last 168 hours. No results for input(s): AMMONIA in the last 168 hours. CBC: Recent Labs  Lab 01/11/23 0424 01/13/23 0456  01/14/23 0421 01/15/23 0407  WBC 12.9* 12.9* 11.7* 11.1*  NEUTROABS  --  8.6* 7.7  --   HGB 9.9* 10.0* 9.6* 10.1*  HCT 31.1* 30.7* 30.9* 32.2*  MCV 98.1 96.2 98.7 98.8  PLT 182 175 182 187   Cardiac Enzymes: No results for input(s): CKTOTAL, CKMB, CKMBINDEX, TROPONINI in the last 168 hours. BNP: Invalid input(s): POCBNP CBG: No results for input(s): GLUCAP in the last 168 hours. D-Dimer No results for input(s): DDIMER in the last 72 hours. Hgb A1c No results for input(s): HGBA1C in the last 72 hours. Lipid Profile No results for input(s): CHOL, HDL, LDLCALC, TRIG, CHOLHDL, LDLDIRECT in the last 72 hours. Thyroid  function studies No results for input(s): TSH, T4TOTAL, T3FREE, THYROIDAB in the last 72 hours.  Invalid input(s): FREET3 Anemia work up No results for input(s): VITAMINB12, FOLATE, FERRITIN, TIBC, IRON, RETICCTPCT in the last 72 hours. Urinalysis    Component Value Date/Time   COLORURINE YELLOW 04/10/2021 1415   APPEARANCEUR CLEAR 04/10/2021 1415   LABSPEC 1.010 04/10/2021 1415   PHURINE 9.0 (H) 04/10/2021 1415   GLUCOSEU NEGATIVE 04/10/2021 1415   GLUCOSEU NEGATIVE 09/26/2017 1121   HGBUR NEGATIVE 04/10/2021 1415   BILIRUBINUR NEGATIVE 04/10/2021 1415   KETONESUR 5 (A) 04/10/2021 1415   PROTEINUR NEGATIVE 04/10/2021 1415   UROBILINOGEN 0.2 09/26/2017 1121   NITRITE NEGATIVE 04/10/2021 1415   LEUKOCYTESUR NEGATIVE 04/10/2021 1415   Sepsis Labs Recent Labs  Lab 01/11/23 0424 01/13/23 0456 01/14/23 0421 01/15/23 0407  WBC 12.9* 12.9* 11.7* 11.1*   Microbiology No results found for this or any previous visit (from the past 240 hours).    Time coordinating discharge: Over 30 minutes  SIGNED:   Darcel Dawley, MD  [facility] 01/16/2023, 3:06 PM Pager   If 7PM-7AM, please contact night-coverage

## 2023-01-16 NOTE — Progress Notes (Signed)
 Radiation Oncology         [phone] ________________________________  Initial Inpatient Consultation  Name: Scott Lindsey MRN: [id number]  Date: 01/16/2023  DOB: [birth year]  RR:Gnwzd, Debby CROME, MD  Joshua Debby CROME, MD   REFERRING PHYSICIAN: Joshua Debby CROME, MD  DIAGNOSIS: C34.2    ICD-10-CM   1. Lung mass  R91.8     2. Squamous cell carcinoma of middle lobe of right lung (HCC)  C34.2       Metastatic squamous cell carcinoma presumably from a right lung primary, with likely hepatic and nodal metastatic disease, RML consolidation, and circumferential effacement of the trachea and SVC by a right hilar bulky nodal conglomerate/mass like area, possible metastatic disease to abdominal nodes  CHIEF COMPLAINT: Here to discuss management of metastatic lung cancer  HISTORY OF PRESENT ILLNESS::Scott Lindsey is a 68 y.o. male who presented to the ED on 01/06/23 with c/o cough, shortness of breath, a 20 pound weight loss over the last several months, and ongoing congestion for several weeks. His cough became progressive and he developed associated hypoxia which prompted him to present to the ED. Labs collected in the ED came back unremarkable.   However, at CTA of the chest was performed which revealed a bulky nodal conglomerate/mass like area centered at the level of the carina extending into the right hilum concerning for metastatic disease, with circumferential effacement of the trachea and SVC. There was also evidence of consolidation in the RML concerning for malignancy, measuring 4.2 x 3.1 cm., an additional superior segment right lower lobe nodule and peripheral right middle lobe nodule, and an ill defined hypodensity centered within segment 4 of the liver measuring 4.5 x 3.5 cm.   He was subsequently admitted for further evaluation and work-up. Upon admission, he was treated for COPD exacerbation and underwent additional work-up including:  -- CT AP with contrast on 12/30 demonstrated: a  hypoenhancing lesion centrally within liver most consistent with hepatic metastasis; small periaortic retroperitoneal lymph nodes concerning for metastatic disease; and the previously demonstrated RML mass concerning for primary lung neoplasm.  -- Abdominal US  on 12/31 demonstrated a 4.7 cm central right lobe liver mass. A biopsy of the liver mass was scheduled for this date, however attempts to establish IV access for sedation were unsuccessful.  -- MRI of the brain with and without contrast on 12/31 thankfully showed no evidence of intracranial metastatic disease.   He was seen at bedside by Dr. Loretha on 01/07/23 who recommended proceeding with biopsies to obtain a definitive diagnosis.   An ultrasound guided left supraclavicular lymph node biopsy was accordingly obtained by IR on 01/10/22. Pathology showed findings consistent with metastatic squamous cell carcinoma, likely replacing the entire lymph node.   Of note: He does have a history notable of prostate cancer (clinical stage T1c adenocarcinoma) diagnosed in 2012, s/p prostatectomy with bilateral pelvic lymph node dissection performed in December of 2012. He did not receive radiation therapy for this.   Patient states that he is doing well overall today. He denies any shortness of breath at baseline and is receiving his breathing treatments multiple times each day. His cough has improved since admission. He denies any hemoptysis. Anticipating discharge soon.  PET will be ordered as outpatient with f/u appt to see Dr Sherrod in med/onc.  PREVIOUS RADIATION THERAPY: No  PAST MEDICAL HISTORY:  has a past medical history of Back pain.    PAST SURGICAL HISTORY: Past Surgical History:  Procedure Laterality Date  MULTIPLE TOOTH EXTRACTIONS     ROBOT ASSISTED LAPAROSCOPIC RADICAL PROSTATECTOMY  12/13/2010   Procedure: ROBOTIC ASSISTED LAPAROSCOPIC RADICAL PROSTATECTOMY;  Surgeon: Alm GORMAN Fragmin, MD;  Location: [facility];  Service: Urology;   Laterality: N/A;  with Bilateral Pelivic Lymph Node Dissection    FAMILY HISTORY: family history includes Prostate cancer in his brother.  SOCIAL HISTORY:  reports that he has been smoking cigarettes. He has a 12.5 pack-year smoking history. He has never used smokeless tobacco. He reports that he does not currently use alcohol  after a past usage of about 10.0 standard drinks of alcohol  per week. He reports that he does not use drugs.  ALLERGIES: Patient has no known allergies.  MEDICATIONS:  No current facility-administered medications for this encounter.   Current Outpatient Medications  Medication Sig Dispense Refill   arformoterol  (BROVANA ) 15 MCG/2ML NEBU Take 2 mLs (15 mcg total) by nebulization 2 (two) times daily. 120 mL 0   budesonide  (PULMICORT ) 0.25 MG/2ML nebulizer solution Take 2 mLs (0.25 mg total) by nebulization 2 (two) times daily. 60 mL 12   guaiFENesin -dextromethorphan (ROBITUSSIN DM) 100-10 MG/5ML syrup Take 5 mLs by mouth every 4 (four) hours as needed for cough. 118 mL 0   metoprolol  tartrate (LOPRESSOR ) 50 MG tablet Take 1 tablet (50 mg total) by mouth 2 (two) times daily. 60 tablet 0   oxyCODONE  (OXY IR/ROXICODONE ) 5 MG immediate release tablet Take 1 tablet (5 mg total) by mouth every 4 (four) hours as needed for up to 3 days for moderate pain (pain score 4-6). 6 tablet 0   [START ON 01/17/2023] predniSONE  (DELTASONE ) 20 MG tablet Take 1 tablet (20 mg total) by mouth daily with breakfast. 30 tablet 0   Facility-Administered Medications Ordered in Other Encounters  Medication Dose Route Frequency Provider Last Rate Last Admin   acetaminophen  (TYLENOL ) tablet 650 mg  650 mg Oral Q6H PRN Dorrell, Robert, MD       Or   acetaminophen  (TYLENOL ) suppository 650 mg  650 mg Rectal Q6H PRN Dorrell, Robert, MD       arformoterol  (BROVANA ) nebulizer solution 15 mcg  15 mcg Nebulization BID Dena Charleston, MD   15 mcg at 01/16/23 9081   atorvastatin  (LIPITOR) tablet 20 mg  20 mg  Oral Daily Dorrell, Charleston, MD   20 mg at 01/16/23 1007   budesonide  (PULMICORT ) nebulizer solution 0.25 mg  0.25 mg Nebulization BID Dena Charleston, MD   0.25 mg at 01/16/23 9080   cloNIDine  (CATAPRES ) tablet 0.1 mg  0.1 mg Oral TID Dena Charleston, MD   0.1 mg at 01/16/23 1007   enoxaparin  (LOVENOX ) injection 40 mg  40 mg Subcutaneous Q24H Lama, Gagan S, MD   40 mg at 01/16/23 1007   feeding supplement (BOOST / RESOURCE BREEZE) liquid 1 Container  1 Container Oral TID BM Drusilla Sabas GORMAN, MD   1 Container at 01/15/23 1000   guaiFENesin -dextromethorphan (ROBITUSSIN DM) 100-10 MG/5ML syrup 5 mL  5 mL Oral Q4H PRN Dorrell, Charleston, MD       ipratropium-albuterol  (DUONEB) 0.5-2.5 (3) MG/3ML nebulizer solution 3 mL  3 mL Nebulization Q4H PRN Drusilla Sabas GORMAN, MD   3 mL at 01/13/23 2100   metoprolol  tartrate (LOPRESSOR ) injection 2.5 mg  2.5 mg Intravenous Q6H PRN Cheryle Page, MD   2.5 mg at 01/14/23 0844   metoprolol  tartrate (LOPRESSOR ) tablet 50 mg  50 mg Oral BID Cheryle Page, MD   50 mg at 01/16/23 1007   multivitamin with minerals  tablet 1 tablet  1 tablet Oral Daily Lama, Gagan S, MD   1 tablet at 01/16/23 1007   ondansetron  (ZOFRAN ) tablet 4 mg  4 mg Oral Q6H PRN Dena Charleston, MD       Or   ondansetron  (ZOFRAN ) injection 4 mg  4 mg Intravenous Q6H PRN Dorrell, Robert, MD       oxyCODONE  (Oxy IR/ROXICODONE ) immediate release tablet 5 mg  5 mg Oral Q4H PRN Dorrell, Robert, MD       predniSONE  (DELTASONE ) tablet 20 mg  20 mg Oral Q breakfast Lama, Gagan S, MD   20 mg at 01/16/23 9163    REVIEW OF SYSTEMS:  Notable for that above.   PHYSICAL EXAM:  vitals were not taken for this visit.   General: Alert and oriented, in no acute distress. He is resting comfortably in bed.  HEENT: Head is normocephalic. Extraocular movements are intact. Oropharynx is clear. No obvious facial swelling.  Neck: Neck is supple, no palpable cervical or supraclavicular lymphadenopathy. b/l JVD present.  Heart:  Regular in rate and rhythm with no murmurs, rubs, or gallops. Chest: Diminished lung sounds within the right lung with rhonchi auscultated throughout. Inspiratory wheezing appreciated.  No increased WOB observed; breathing O2 through [place] Abdomen: Soft, nontender, nondistended, with no rigidity or guarding. Extremities: No cyanosis or edema. Lymphatics: see Neck Exam Skin: No concerning lesions. Musculoskeletal: symmetric strength and muscle tone throughout. Neurologic: Cranial nerves II through XII are grossly intact. No obvious focalities. Speech is fluent. Coordination is intact. Psychiatric: Judgment and insight are intact. Affect is appropriate.   ECOG = 1  0 - Asymptomatic (Fully active, able to carry on all predisease activities without restriction)  1 - Symptomatic but completely ambulatory (Restricted in physically strenuous activity but ambulatory and able to carry out work of a light or sedentary nature. For example, light housework, office work)  2 - Symptomatic, <50% in bed during the day (Ambulatory and capable of all self care but unable to carry out any work activities. Up and about more than 50% of waking hours)  3 - Symptomatic, >50% in bed, but not bedbound (Capable of only limited self-care, confined to bed or chair 50% or more of waking hours)  4 - Bedbound (Completely disabled. Cannot carry on any self-care. Totally confined to bed or chair)  5 - Death   Raylene MM, Creech RH, Tormey DC, et al. [phone]). Toxicity and response criteria of the [facility]. Am. DOROTHA Bridges. Oncol. 5 (6): 649-55   LABORATORY DATA:  Lab Results  Component Value Date   WBC 11.1 (H) 01/15/2023   HGB 10.1 (L) 01/15/2023   HCT 32.2 (L) 01/15/2023   MCV 98.8 01/15/2023   PLT 187 01/15/2023   CMP     Component Value Date/Time   NA 135 01/14/2023 0421   NA 138 05/02/2020 1106   K 3.9 01/14/2023 0421   CL 102 01/14/2023 0421   CO2 23 01/14/2023 0421   GLUCOSE 96  01/14/2023 0421   BUN 17 01/14/2023 0421   BUN 14 05/02/2020 1106   CREATININE 0.77 01/14/2023 0421   CALCIUM  9.8 01/14/2023 0421   PROT 7.2 01/09/2023 0646   ALBUMIN 2.6 (L) 01/09/2023 0646   AST 19 01/09/2023 0646   ALT 15 01/09/2023 0646   ALKPHOS 93 01/09/2023 0646   BILITOT 0.4 01/09/2023 0646   GFR 68.65 08/02/2022 1057   EGFR 60 05/02/2020 1106   GFRNONAA >60 01/14/2023 0421  RADIOGRAPHY: US  CORE BIOPSY (LYMPH NODES) Result Date: 01/11/2023 INDICATION: No known primary, now with concern for metastatic small-cell lung cancer. Please perform ultrasound-guided liver or lymph node biopsy for tissue diagnostic purposes as indicated. EXAM: ULTRASOUND-GUIDED LEFT SUPRACLAVICULAR LYMPH NODE BIOPSY COMPARISON:  Chest CT-01/06/2023; CT abdomen pelvis-01/07/2023 MEDICATIONS: None ANESTHESIA/SEDATION: Moderate (conscious) sedation was employed during this procedure. A total of Versed  2 mg and Fentanyl  100 mcg was administered intravenously. Moderate Sedation Time: 10 minutes. The patient's level of consciousness and vital signs were monitored continuously by radiology nursing throughout the procedure under my direct supervision. COMPLICATIONS: None immediate. TECHNIQUE: Informed written consent was obtained from the patient after a discussion of the risks, benefits and alternatives to treatment. Questions regarding the procedure were encouraged and answered. Initial ultrasound scanning demonstrated an at least 4.8 x 4.3 x 3.6 cm hypoechoic mass within the central aspect of the right lobe of the liver, correlating with the mass seen on preceding abdominal CT image 19, series 2. Given the central location of the liver lesion as well as the subtotal occlusion of the celiac, the abdominal aorta as well as the bilateral common femoral arteries demonstrated on preceding abdominal CT (rendering the patient and extremely poor angiography candidate if he were to experience clinically significant  postprocedural bleeding), sonographic evaluation was performed of both the right and left neck and supraclavicular fossa. An abnormal appearing left supraclavicular lymph node measuring at least 2.1 x 1.4 cm was identified within the left supraclavicular fossa (image 15), and thus was targeted for biopsy. Multiple ultrasound images were saved procedural documentation purposes. The procedure was planned. A timeout was performed prior to the initiation of the procedure. The operative was prepped and draped in the usual sterile fashion, and a sterile drape was applied covering the operative field. A timeout was performed prior to the initiation of the procedure. Local anesthesia was provided with 1% lidocaine  with epinephrine . Under direct ultrasound guidance, an 18 gauge core needle device was utilized to obtain to obtain 6 core needle biopsies of the indeterminate left supraclavicular fossa. The samples were placed in saline and submitted to pathology. The needle was removed and superficial hemostasis was achieved with manual compression. Post procedure scan was negative for significant hematoma. A dressing was applied. The patient tolerated the procedure well without immediate postprocedural complication. IMPRESSION: Technically successful ultrasound guided biopsy of dominant indeterminate left supraclavicular lymph node. 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 Electronically Signed   By: Norleen Roulette M.D.   On: 01/11/2023 12:10   MR BRAIN W WO CONTRAST Result Date: 01/09/2023 CLINICAL DATA:  Non-small cell lung carcinoma staging EXAM: MRI HEAD WITHOUT AND WITH CONTRAST TECHNIQUE: Multiplanar, multiecho pulse  sequences of the brain and surrounding structures were obtained without and with intravenous contrast. CONTRAST:  6mL GADAVIST  GADOBUTROL  1 MMOL/ML IV SOLN COMPARISON:  06/03/2003 FINDINGS: Brain: No acute infarct, mass effect or extra-axial collection. Chronic microhemorrhage in the left pons. There is multifocal hyperintense T2-weighted signal within the white matter. Generalized volume loss. The midline structures are normal. There is no abnormal contrast enhancement. Vascular: Normal flow voids. Skull and upper cervical spine: Bilateral mastoid fluid. Paranasal sinuses are clear. Normal orbits. Sinuses/Orbits:No paranasal sinus fluid levels or advanced mucosal thickening. No mastoid or middle ear effusion.  Normal orbits. IMPRESSION: 1. No intracranial metastatic disease. 2. Findings of chronic small vessel ischemia and volume loss. Electronically Signed   By: Franky Stanford M.D.   On: 01/09/2023 01:49   US  Abdomen Limited RUQ (LIVER/GB) Result Date: 01/08/2023 CLINICAL DATA:  Central right lobe liver lesion and right lung mass suspicious for malignancy. The patient presents for ultrasound-guided biopsy of the liver lesion. EXAM: ULTRASOUND ABDOMEN LIMITED RIGHT UPPER QUADRANT COMPARISON:  None Available. FINDINGS: Ultrasound was performed in anticipation of performing image guided liver lesion biopsy. A discrete ovoid hypoechoic mass in the central right lobe measures approximately 4.7 x 3.3 x 4.1 cm and is in the deep central right lobe. The biopsy procedure could not be performed as multiple nurses were unsuccessful in establishing durable IV access for the patient to receive IV conscious sedation. The patient had to be sent back to his room for the IV Team to try to establish IV access. The liver biopsy will be rescheduled. IMPRESSION: 4.7 cm central right lobe liver mass. A biopsy procedure could not be performed as multiple nurses were unsuccessful in establishing IV access for the patient to receive IV  conscious sedation. The liver biopsy will be rescheduled. Electronically Signed   By: Marcey Moan M.D.   On: 01/08/2023 16:59   CT ABDOMEN PELVIS W CONTRAST Result Date: 01/07/2023 CLINICAL DATA:  Abnormal CT of the chest 1 day prior. Lymphadenopathy and pulmonary mass. * Tracking Code: BO * EXAM: CT ABDOMEN AND PELVIS WITH CONTRAST TECHNIQUE: Multidetector CT imaging of the abdomen and pelvis was performed using the standard protocol following bolus administration of intravenous contrast. RADIATION DOSE REDUCTION: This exam was performed according to the departmental dose-optimization program which includes automated exposure control, adjustment of the mA and/or kV according to patient size and/or use of iterative reconstruction technique. CONTRAST:  OMNIPAQUE  IOHEXOL  300 MG/ML  SOLN COMPARISON:  CT chest 01/06/2023 FINDINGS: Lower chest: Rounded 3.6 cm mass in the RIGHT middle lobe (image 8). Mediastinal hilar adenopathy not well appreciated at the base the heart. See CT chest comparison Hepatobiliary: Low-density lesion centrally within liver is hypoenhancing measuring 31 x 26 mm (image 17/2) Pancreas: No biliary duct dilatation. No pancreatic duct dilatation. Body and tail the pancreas are mildly atrophic. Mass lesion identified. Spleen: Normal spleen Adrenals/urinary tract: Adrenal glands and kidneys are normal. The ureters and bladder normal. Stomach/Bowel: Stomach and small bowel normal. There is moderate volume stool throughout the colon without obstructing lesion identified. Vascular/Lymphatic: Dense calcification abdominal aorta. There small periaortic retroperitoneal lymph nodes measuring up to 9 mm (image 39/series 2) no pelvic lymphadenopathy. No inguinal adenopathy Reproductive: Unremarkable Other: No free fluid. Musculoskeletal: No aggressive osseous lesion. IMPRESSION: 1. Hypoenhancing lesion centrally within liver is most consistent with hepatic metastasis. 2. Small periaortic  retroperitoneal lymph nodes are concerning for metastasis 3. RIGHT middle lobe mass is concerning for primary lung neoplasm. See CT chest comparison. 4. No evidence of pancreatic mass. 5. There is moderate volume stool throughout the colon without obstructing lesion identified. Cannot exclude colorectal carcinoma primary but favor lung cancer. 6.  Aortic Atherosclerosis (ICD10-I70.0). Electronically Signed   By: Jackquline Boxer M.D.   On: 01/07/2023 16:15   CT Angio Chest PE W and/or Wo Contrast Result Date: 01/06/2023 CLINICAL DATA:  Shortness of breath on exertion EXAM: CT ANGIOGRAPHY CHEST WITH CONTRAST TECHNIQUE: Multidetector CT imaging of the chest was performed using the standard protocol during bolus administration of intravenous contrast. Multiplanar CT image reconstructions and MIPs were  obtained to evaluate the vascular anatomy. RADIATION DOSE REDUCTION: This exam was performed according to the departmental dose-optimization program which includes automated exposure control, adjustment of the mA and/or kV according to patient size and/or use of iterative reconstruction technique. CONTRAST:  75mL OMNIPAQUE  IOHEXOL  350 MG/ML SOLN COMPARISON:  Chest radiograph dated 10/12/2022, CT abdomen and pelvis dated 04/10/2021 FINDINGS: Cardiovascular: The study is high quality for the evaluation of pulmonary embolism. Effacement of the right main pulmonary artery and most notably the right upper lobar pulmonary artery due to bulky mediastinal lymphadenopathy. There are no filling defects in the central, lobar, segmental or subsegmental pulmonary artery branches to suggest acute pulmonary embolism. Great vessels are normal in course and caliber. Normal heart size. No significant pericardial fluid/thickening. Coronary artery calcifications and aortic atherosclerosis. Severe luminal narrowing of the SVC. Reflux of contrast material into the hepatic veins, suggesting a degree of right heart dysfunction. Multiple  collaterals within the skin and paraspinal region. Mediastinum/Nodes: Imaged thyroid  gland without nodules meeting criteria for imaging follow-up by size. Normal esophagus. Bulky nodal conglomerate centered at the level of the carina extending into the right hilum spans approximately 8.1 x 4.8 cm (4:70) and results in mass effect and circumferential luminal effacement of the trachea, SVC, and pulmonary arteries. 19 mm right anterior mediastinal lymph node (4:70). Lungs/Pleura: Marked effacement of the distal trachea and bilateral proximal bronchi, right-greater-than-left due to mediastinal lymphadenopathy. Small volume adherent secretions within the trachea. Irregular consolidation in the right middle lobe with tethering and traction of the right major fissure measures 4.2 x 3.1 cm (6:112). Superior segment right lower lobe nodule measures 2.3 x 2.2 cm (6:93). Additional peripheral nodule in the right middle lobe measures 0.9 x 0.7 cm (6:106). These appear new compared to 04/10/2021. Background findings of pulmonary fibrosis with right-greater-than-left lower lobe predominant honeycombing and traction bronchiectasis. No pneumothorax. No pleural effusion. Upper abdomen: Ill-defined hypodensity centered within segment 4 measures 4.5 x 3.5 cm (4:142), new from 04/10/2021. Musculoskeletal: No acute or abnormal lytic or blastic osseous lesions. Review of the MIP images confirms the above findings. IMPRESSION: 1. No evidence of pulmonary embolism. 2. Bulky nodal conglomerate centered at the level of the carina extending into the right hilum results in mass effect and circumferential luminal effacement of the trachea, SVC, and pulmonary arteries. Findings are highly suspicious for metastatic disease. 3. Irregular consolidation in the right middle lobe with tethering and traction of the right major fissure measures 4.2 x 3.1 cm, suspicious for malignancy, either primary or metastatic. Additional superior segment right lower  lobe nodule and peripheral right middle lobe nodule. These appear new compared to 04/10/2021. 4. Ill-defined hypodensity centered within segment 4 measures 4.5 x 3.5 cm, new from 04/10/2021, suspicious for metastatic disease. 5. Reflux of contrast material into the hepatic veins, suggesting a degree of right heart dysfunction. 6.  Aortic Atherosclerosis (ICD10-I70.0). Electronically Signed   By: Limin  Xu M.D.   On: 01/06/2023 16:36      IMPRESSION/PLAN: Metastatic squamous cell carcinoma presumably from a right lung primary, with hepatic and nodal metastatic disease, RML consolidation, and circumferential effacement of the trachea and SVC by a right hilar bulky nodal conglomerate/mass like area   We personally reviewed this patient's case and pertinent imaging. Imaging demonstrates a RML lung mass with effacement of the trachea and SVC, with likely hepatic and possible abdominal nodal metastastic disease. Biopsy of the Left supraclavicular node has confirmed metastatic squamous cell carcinoma, likely from lung primary. We have reviewed this  patient's case with Dr. Loretha. Based on the current work-up, she does not believe a liver biopsy is necessary, as the findings are most likely indicative metastatic disease. Treatment will likely be palliative in nature.   Patient does not seem to be profoundly asymptomatic at this time, so treatment is not urgent at this time and discharge today is fine. However, we have scheduled him for CT simulation this afternoon, anticipating start of treatment on Monday 01/20/21.  Scott Lindsey is going to be discharged later today and is scheduled to see Dr. Sherrod on 01/22/2023.   We anticipate 15 fractions of palliative radiation directed at the right lung mass and surrounding bulky adenopathy. Will contact SW as transportation back-up plans are desired by patient.  On date of service, in total, we spent 60 minutes on this encounter. Patient was seen in  person. __________________________________________    Leeroy Due, PA-C   Lauraine Golden, MD    [facility]  Radiation Oncology Direct Dial: [phone]  Fax: [phone] [email or web address]    This document serves as a record of services personally performed by Lauraine Golden, MD and Leeroy Due, PA-C. It was created on her behalf by Dorthy Fuse, a trained medical scribe. The creation of this record is based on the scribe's personal observations and the provider's statements to them. This document has been checked and approved by the attending provider.

## 2023-01-17 ENCOUNTER — Telehealth: Payer: Self-pay

## 2023-01-17 ENCOUNTER — Ambulatory Visit: Payer: Medicare Other

## 2023-01-17 ENCOUNTER — Other Ambulatory Visit: Payer: Self-pay | Admitting: Family Medicine

## 2023-01-17 ENCOUNTER — Ambulatory Visit: Payer: Medicare Other | Admitting: Radiation Oncology

## 2023-01-17 DIAGNOSIS — R918 Other nonspecific abnormal finding of lung field: Secondary | ICD-10-CM

## 2023-01-17 DIAGNOSIS — Z5982 Transportation insecurity: Secondary | ICD-10-CM

## 2023-01-17 MED ORDER — BUDESONIDE 0.25 MG/2ML IN SUSP: 0.2500 mg | Freq: Two times a day (BID) | RESPIRATORY_TRACT | 12 refills | Status: DC

## 2023-01-17 MED ORDER — BUDESONIDE-FORMOTEROL FUMARATE 160-4.5 MCG/ACT IN AERO: 2.0000 | INHALATION_SPRAY | Freq: Two times a day (BID) | RESPIRATORY_TRACT | 1 refills | Status: DC

## 2023-01-17 NOTE — Progress Notes (Addendum)
 Complex Care Management Note Care Guide Note  01/17/2023 Name: Scott Lindsey MRN: [id number] DOB: 06/16/1955  Scott Lindsey is a 68 y.o. year old male who is a primary care patient of Joshua Debby CROME, MD . The community resource team was consulted for assistance with Transportation Needs   SDOH screenings and interventions completed:  Yes  SDOH Interventions Today    Flowsheet Row Most Recent Value  SDOH Interventions   Transportation Interventions Other (Comment)  [Patient's spouse will call the Oncology Nurse Navigator today to provide transportation for immediate upcoming appointments. I will assist her with completing patient's part of [facility] application tomorrow 01/18/23 and email Pt B to PCP to complete.]        Care guide performed the following interventions: Spoke with patient's wife Scott Lindsey regarding transportation assistance. Holmes will call patient's Oncology Nurse Navigator today to request transportation assistance for immediate upcoming appointments. [facility] is unable to process applications for immediate request they have to receive patient and physicians completed forms.   Follow Up Plan:  I will call Scott Lindsey tomorrow to assist her with completing the patient part A application and fax the physician part B to patient's PCP Dr. Joshua to complete and send to [facility]. 01/18/23 patient is in the ED will follow-up next week.  Encounter Outcome:  Patient Visit Completed  Scott Lindsey [facility]  [facility], [facility] Guide Direct Dial: [phone]  Website: [email or web address]

## 2023-01-17 NOTE — Transitions of Care (Post Inpatient/ED Visit) (Signed)
 01/17/2023  Name: Scott Lindsey MRN: [id number] DOB: 28-Mar-1955  Today's TOC FU Call Status: Today's TOC FU Call Status:: Successful TOC FU Call Completed TOC FU Call Complete Date: 01/17/23 Patient's Name and Date of Birth confirmed.  Transition Care Management Follow-up Telephone Call Date of Discharge: 01/16/23 Discharge Facility: [facility]) Type of Discharge: Inpatient Admission Primary Inpatient Discharge Diagnosis:: COPD; Lung Mass How have you been since you were released from the hospital?:  (Per patients spouse) Any questions or concerns?: Yes Patient Questions/Concerns:: Patients wife needs assistance with transportation Patient Questions/Concerns Addressed: Other: (Referred for transportation assistance)  Items Reviewed: Did you receive and understand the discharge instructions provided?: Yes Medications obtained,verified, and reconciled?: Yes (Medications Reviewed) Any new allergies since your discharge?: No Dietary orders reviewed?: No Do you have support at home?: Yes People in Home: spouse Name of Support/Comfort Primary Source: Holmes- wife  Medications Reviewed Today: Medications Reviewed Today     Reviewed by Fabian Sober, RN (Case Manager) on 01/17/23 at 1332  Med List Status: <None>   Medication Order Taking? Sig Documenting Provider Last Dose Status Informant  albuterol  (VENTOLIN  HFA) 108 (90 Base) MCG/ACT inhaler [id number] Yes Inhale 2 puffs into the lungs every 6 (six) hours as needed for wheezing or shortness of breath. Norleen Lynwood ORN, MD Taking Active Multiple Informants  arformoterol  (BROVANA ) 15 MCG/2ML NEBU [id number] Yes Take 2 mLs (15 mcg total) by nebulization 2 (two) times daily. Leotis Bogus, MD Taking Active   atorvastatin  (LIPITOR) 20 MG tablet [id number] Yes Take 1 tablet (20 mg total) by mouth daily. Burnard Debby LABOR, MD Taking Active Spouse/Significant Other  budesonide  (PULMICORT ) 0.25 MG/2ML nebulizer solution [id number] Yes Take 2  mLs (0.25 mg total) by nebulization 2 (two) times daily. Leotis Bogus, MD Taking Active   budesonide -formoterol  (SYMBICORT ) 160-4.5 MCG/ACT inhaler [id number] Yes Inhale 2 puffs into the lungs 2 (two) times daily. Leotis Bogus, MD Taking Active   cloNIDine  (CATAPRES ) 0.1 MG tablet [id number] Yes TAKE 1 TABLET BY MOUTH THREE TIMES DAILY  Patient taking differently: Take 0.1 mg by mouth See admin instructions. Take 0.1 mg by mouth one to two times a day   Kate Lonni CROME, MD Taking Active Spouse/Significant Other  folic acid  (FOLVITE ) 1 MG tablet [id number] Yes Take 1 tablet by mouth once daily Joshua Debby CROME, MD Taking Active Spouse/Significant Other  guaiFENesin -dextromethorphan (ROBITUSSIN DM) 100-10 MG/5ML syrup [id number] Yes Take 5 mLs by mouth every 4 (four) hours as needed for cough. Leotis Bogus, MD Taking Active   losartan  (COZAAR ) 100 MG tablet [id number] Yes Take 1 tablet by mouth once daily  Patient taking differently: Take 100 mg by mouth daily.   Burnard Debby LABOR, MD Taking Active Spouse/Significant Other  magnesium  oxide (MAG-OX) 400 MG tablet [id number] Yes Take 1 tablet (400 mg total) by mouth daily.  Patient taking differently: Take 100 mg by mouth in the morning, at noon, in the evening, and at bedtime.   Kate Lonni CROME, MD Taking Active Spouse/Significant Other  metoprolol  tartrate (LOPRESSOR ) 50 MG tablet [id number] Yes Take 1 tablet (50 mg total) by mouth 2 (two) times daily. Leotis Bogus, MD Taking Active   multivitamin (ONE-A-DAY MEN'S) TABS tablet [id number] Yes Take 1 tablet by mouth daily with breakfast. [provider] Taking Active Spouse/Significant Other  oxyCODONE  (OXY IR/ROXICODONE ) 5 MG immediate release tablet [id number] Yes Take 1 tablet (5 mg total) by mouth every 4 (four) hours as needed for up to 3 days for moderate pain (pain  score 4-6). Leotis Bogus, MD Taking Active   predniSONE  (DELTASONE ) 20 MG tablet [id number] Yes Take 1  tablet (20 mg total) by mouth daily with breakfast. Leotis Bogus, MD Taking Active             Home Care and Equipment/Supplies: Were Home Health Services Ordered?: No Any new equipment or medical supplies ordered?: No  Functional Questionnaire: Do you need assistance with bathing/showering or dressing?: Yes Do you need assistance with meal preparation?: No Do you need assistance with eating?: No Do you have difficulty maintaining continence: No Do you need assistance with getting out of bed/getting out of a chair/moving?: Yes Do you have difficulty managing or taking your medications?: Yes  Follow up appointments reviewed: PCP Follow-up appointment confirmed?: Yes Date of PCP follow-up appointment?: 01/31/23 Follow-up Provider: Dr. Joshua Specialist [facility] Follow-up appointment confirmed?: Yes Date of Specialist follow-up appointment?: 01/22/23 Follow-Up Specialty Provider:: Dr. Sherrod Do you need transportation to your follow-up appointment?: Yes Transportation Need Intervention Addressed By:: AMB Referral For SDOH Needs Do you understand care options if your condition(s) worsen?: Yes-patient verbalized understanding  SDOH Interventions Today    Flowsheet Row Most Recent Value  SDOH Interventions   Food Insecurity Interventions Intervention Not Indicated  Housing Interventions Intervention Not Indicated  Transportation Interventions Other (Comment), AMB Referral  [Patients wife had hip replacement and is not driving currently]  Utilities Interventions Intervention Not Indicated      TOC Interventions Today    Flowsheet Row Most Recent Value  TOC Interventions   TOC Interventions Discussed/Reviewed TOC Interventions Discussed, TOC Interventions Reviewed       Interventions Today    Flowsheet Row Most Recent Value  General Interventions   General Interventions Discussed/Reviewed Community Resources        Goals Addressed             This Visit's  Progress    30 day TOC Program       Current Barriers:  Transportation Referred 2400 SDOH Transportation  RNCM Clinical Goal(s):  Patient will work with the Care Management team over the next 30 days to address Transition of Care Barriers: Medication Management Support at home Provider appointments Transportation through collaboration with Medical Illustrator, provider, and care team.   Interventions: Evaluation of current treatment plan related to  self management and patient's adherence to plan as established by provider  Transitions of Care:  New goal. Doctor Visits  - discussed the importance of doctor visits  Patient Goals/Self-Care Activities: Participate in Transition of Care Program/Attend [facility] scheduled calls Notify RN Care Manager of TOC call rescheduling needs Take all medications as prescribed Attend all scheduled provider appointments Call pharmacy for medication refills 3-7 days in advance of running out of medications Call provider office for new concerns or questions   Follow Up Plan:  Telephone follow up appointment with care management team member scheduled for:  01/23/22@10 :00 with Laine Tousey, RN         Barnie Gowda RN, BSN, CCM RN Care Manager  Transitions of Care  [place] [facility] [employer]  [phone]

## 2023-01-18 ENCOUNTER — Emergency Department (HOSPITAL_COMMUNITY): Payer: Medicare Other

## 2023-01-18 ENCOUNTER — Inpatient Hospital Stay (HOSPITAL_COMMUNITY): Payer: Medicare Other

## 2023-01-18 ENCOUNTER — Encounter (HOSPITAL_COMMUNITY): Payer: Self-pay

## 2023-01-18 ENCOUNTER — Other Ambulatory Visit: Payer: Self-pay

## 2023-01-18 ENCOUNTER — Inpatient Hospital Stay (HOSPITAL_COMMUNITY)
Admission: EM | Admit: 2023-01-18 | Discharge: 2023-02-09 | DRG: 207 | Disposition: E | Payer: Medicare Other | Attending: Pulmonary Disease | Admitting: Pulmonary Disease

## 2023-01-18 ENCOUNTER — Ambulatory Visit: Payer: Medicare Other

## 2023-01-18 ENCOUNTER — Telehealth: Payer: Self-pay

## 2023-01-18 DIAGNOSIS — R570 Cardiogenic shock: Secondary | ICD-10-CM | POA: Diagnosis not present

## 2023-01-18 DIAGNOSIS — R9082 White matter disease, unspecified: Secondary | ICD-10-CM | POA: Diagnosis not present

## 2023-01-18 DIAGNOSIS — R739 Hyperglycemia, unspecified: Secondary | ICD-10-CM | POA: Diagnosis not present

## 2023-01-18 DIAGNOSIS — I468 Cardiac arrest due to other underlying condition: Secondary | ICD-10-CM | POA: Diagnosis present

## 2023-01-18 DIAGNOSIS — C3490 Malignant neoplasm of unspecified part of unspecified bronchus or lung: Secondary | ICD-10-CM | POA: Diagnosis present

## 2023-01-18 DIAGNOSIS — G931 Anoxic brain damage, not elsewhere classified: Secondary | ICD-10-CM | POA: Diagnosis present

## 2023-01-18 DIAGNOSIS — T884XXA Failed or difficult intubation, initial encounter: Secondary | ICD-10-CM | POA: Diagnosis not present

## 2023-01-18 DIAGNOSIS — E872 Acidosis, unspecified: Secondary | ICD-10-CM | POA: Diagnosis present

## 2023-01-18 DIAGNOSIS — Z1152 Encounter for screening for COVID-19: Secondary | ICD-10-CM

## 2023-01-18 DIAGNOSIS — I6502 Occlusion and stenosis of left vertebral artery: Secondary | ICD-10-CM | POA: Diagnosis not present

## 2023-01-18 DIAGNOSIS — J984 Other disorders of lung: Secondary | ICD-10-CM | POA: Diagnosis not present

## 2023-01-18 DIAGNOSIS — Z978 Presence of other specified devices: Secondary | ICD-10-CM

## 2023-01-18 DIAGNOSIS — Z4682 Encounter for fitting and adjustment of non-vascular catheter: Secondary | ICD-10-CM | POA: Diagnosis not present

## 2023-01-18 DIAGNOSIS — R569 Unspecified convulsions: Secondary | ICD-10-CM | POA: Diagnosis not present

## 2023-01-18 DIAGNOSIS — R578 Other shock: Secondary | ICD-10-CM | POA: Diagnosis present

## 2023-01-18 DIAGNOSIS — J189 Pneumonia, unspecified organism: Secondary | ICD-10-CM | POA: Diagnosis present

## 2023-01-18 DIAGNOSIS — F1011 Alcohol abuse, in remission: Secondary | ICD-10-CM | POA: Diagnosis present

## 2023-01-18 DIAGNOSIS — S2242XA Multiple fractures of ribs, left side, initial encounter for closed fracture: Secondary | ICD-10-CM | POA: Diagnosis not present

## 2023-01-18 DIAGNOSIS — I499 Cardiac arrhythmia, unspecified: Secondary | ICD-10-CM | POA: Diagnosis not present

## 2023-01-18 DIAGNOSIS — J9859 Other diseases of mediastinum, not elsewhere classified: Secondary | ICD-10-CM | POA: Diagnosis not present

## 2023-01-18 DIAGNOSIS — K148 Other diseases of tongue: Secondary | ICD-10-CM | POA: Diagnosis present

## 2023-01-18 DIAGNOSIS — J8410 Pulmonary fibrosis, unspecified: Secondary | ICD-10-CM | POA: Diagnosis present

## 2023-01-18 DIAGNOSIS — I4891 Unspecified atrial fibrillation: Secondary | ICD-10-CM

## 2023-01-18 DIAGNOSIS — R0609 Other forms of dyspnea: Secondary | ICD-10-CM

## 2023-01-18 DIAGNOSIS — Z79899 Other long term (current) drug therapy: Secondary | ICD-10-CM

## 2023-01-18 DIAGNOSIS — Z8042 Family history of malignant neoplasm of prostate: Secondary | ICD-10-CM

## 2023-01-18 DIAGNOSIS — J441 Chronic obstructive pulmonary disease with (acute) exacerbation: Secondary | ICD-10-CM | POA: Diagnosis not present

## 2023-01-18 DIAGNOSIS — D649 Anemia, unspecified: Secondary | ICD-10-CM | POA: Diagnosis not present

## 2023-01-18 DIAGNOSIS — K860 Alcohol-induced chronic pancreatitis: Secondary | ICD-10-CM | POA: Diagnosis present

## 2023-01-18 DIAGNOSIS — I493 Ventricular premature depolarization: Secondary | ICD-10-CM | POA: Diagnosis present

## 2023-01-18 DIAGNOSIS — I2729 Other secondary pulmonary hypertension: Secondary | ICD-10-CM | POA: Diagnosis present

## 2023-01-18 DIAGNOSIS — F17210 Nicotine dependence, cigarettes, uncomplicated: Secondary | ICD-10-CM | POA: Diagnosis present

## 2023-01-18 DIAGNOSIS — T380X5A Adverse effect of glucocorticoids and synthetic analogues, initial encounter: Secondary | ICD-10-CM | POA: Diagnosis not present

## 2023-01-18 DIAGNOSIS — R0600 Dyspnea, unspecified: Secondary | ICD-10-CM | POA: Diagnosis not present

## 2023-01-18 DIAGNOSIS — J440 Chronic obstructive pulmonary disease with acute lower respiratory infection: Secondary | ICD-10-CM | POA: Diagnosis not present

## 2023-01-18 DIAGNOSIS — I3139 Other pericardial effusion (noninflammatory): Secondary | ICD-10-CM | POA: Diagnosis present

## 2023-01-18 DIAGNOSIS — I110 Hypertensive heart disease with heart failure: Secondary | ICD-10-CM | POA: Diagnosis not present

## 2023-01-18 DIAGNOSIS — I959 Hypotension, unspecified: Secondary | ICD-10-CM | POA: Diagnosis not present

## 2023-01-18 DIAGNOSIS — C787 Secondary malignant neoplasm of liver and intrahepatic bile duct: Secondary | ICD-10-CM | POA: Diagnosis present

## 2023-01-18 DIAGNOSIS — C342 Malignant neoplasm of middle lobe, bronchus or lung: Secondary | ICD-10-CM | POA: Diagnosis not present

## 2023-01-18 DIAGNOSIS — Z515 Encounter for palliative care: Secondary | ICD-10-CM

## 2023-01-18 DIAGNOSIS — J9601 Acute respiratory failure with hypoxia: Secondary | ICD-10-CM | POA: Diagnosis not present

## 2023-01-18 DIAGNOSIS — C771 Secondary and unspecified malignant neoplasm of intrathoracic lymph nodes: Secondary | ICD-10-CM | POA: Diagnosis present

## 2023-01-18 DIAGNOSIS — R000 Tachycardia, unspecified: Secondary | ICD-10-CM | POA: Diagnosis not present

## 2023-01-18 DIAGNOSIS — E43 Unspecified severe protein-calorie malnutrition: Secondary | ICD-10-CM | POA: Diagnosis not present

## 2023-01-18 DIAGNOSIS — J9600 Acute respiratory failure, unspecified whether with hypoxia or hypercapnia: Secondary | ICD-10-CM | POA: Diagnosis present

## 2023-01-18 DIAGNOSIS — E873 Alkalosis: Secondary | ICD-10-CM | POA: Diagnosis not present

## 2023-01-18 DIAGNOSIS — G9341 Metabolic encephalopathy: Secondary | ICD-10-CM | POA: Diagnosis present

## 2023-01-18 DIAGNOSIS — R911 Solitary pulmonary nodule: Secondary | ICD-10-CM | POA: Diagnosis not present

## 2023-01-18 DIAGNOSIS — I469 Cardiac arrest, cause unspecified: Secondary | ICD-10-CM | POA: Diagnosis not present

## 2023-01-18 DIAGNOSIS — R531 Weakness: Secondary | ICD-10-CM | POA: Diagnosis not present

## 2023-01-18 DIAGNOSIS — Z8546 Personal history of malignant neoplasm of prostate: Secondary | ICD-10-CM

## 2023-01-18 DIAGNOSIS — I6523 Occlusion and stenosis of bilateral carotid arteries: Secondary | ICD-10-CM | POA: Diagnosis not present

## 2023-01-18 DIAGNOSIS — Z66 Do not resuscitate: Secondary | ICD-10-CM | POA: Diagnosis not present

## 2023-01-18 DIAGNOSIS — E785 Hyperlipidemia, unspecified: Secondary | ICD-10-CM | POA: Diagnosis present

## 2023-01-18 DIAGNOSIS — Z6824 Body mass index (BMI) 24.0-24.9, adult: Secondary | ICD-10-CM

## 2023-01-18 DIAGNOSIS — Z7952 Long term (current) use of systemic steroids: Secondary | ICD-10-CM

## 2023-01-18 DIAGNOSIS — Z7951 Long term (current) use of inhaled steroids: Secondary | ICD-10-CM

## 2023-01-18 DIAGNOSIS — G253 Myoclonus: Secondary | ICD-10-CM | POA: Diagnosis present

## 2023-01-18 DIAGNOSIS — R918 Other nonspecific abnormal finding of lung field: Secondary | ICD-10-CM | POA: Diagnosis not present

## 2023-01-18 DIAGNOSIS — J9690 Respiratory failure, unspecified, unspecified whether with hypoxia or hypercapnia: Secondary | ICD-10-CM | POA: Diagnosis not present

## 2023-01-18 DIAGNOSIS — I50810 Right heart failure, unspecified: Secondary | ICD-10-CM | POA: Diagnosis present

## 2023-01-18 LAB — CBC
HCT: 26.4 % — ABNORMAL LOW (ref 39.0–52.0)
Hemoglobin: 8.6 g/dL — ABNORMAL LOW (ref 13.0–17.0)
MCH: 31.3 pg (ref 26.0–34.0)
MCHC: 32.6 g/dL (ref 30.0–36.0)
MCV: 96 fL (ref 80.0–100.0)
Platelets: 132 10*3/uL — ABNORMAL LOW (ref 150–400)
RBC: 2.75 MIL/uL — ABNORMAL LOW (ref 4.22–5.81)
RDW: 15.2 % (ref 11.5–15.5)
WBC: 17 10*3/uL — ABNORMAL HIGH (ref 4.0–10.5)
nRBC: 0 % (ref 0.0–0.2)

## 2023-01-18 LAB — ECHOCARDIOGRAM COMPLETE
Area-P 1/2: 9.98 cm2
Height: 68 in
S' Lateral: 2.9 cm
Weight: 2144 [oz_av]

## 2023-01-18 LAB — BASIC METABOLIC PANEL
Anion gap: 12 (ref 5–15)
BUN: 14 mg/dL (ref 8–23)
CO2: 20 mmol/L — ABNORMAL LOW (ref 22–32)
Calcium: 10.2 mg/dL (ref 8.9–10.3)
Chloride: 103 mmol/L (ref 98–111)
Creatinine, Ser: 0.99 mg/dL (ref 0.61–1.24)
GFR, Estimated: >60 mL/min (ref >60)
Glucose, Bld: 82 mg/dL (ref 70–99)
Potassium: 4.7 mmol/L (ref 3.5–5.1)
Sodium: 135 mmol/L (ref 135–145)

## 2023-01-18 LAB — CBC WITH DIFFERENTIAL/PLATELET
Abs Immature Granulocytes: 0.45 10*3/uL — ABNORMAL HIGH (ref 0.00–0.07)
Basophils Absolute: 0 10*3/uL (ref 0.0–0.1)
Basophils Relative: 0 %
Eosinophils Absolute: 0.2 10*3/uL (ref 0.0–0.5)
Eosinophils Relative: 1 %
HCT: 28.6 % — ABNORMAL LOW (ref 39.0–52.0)
Hemoglobin: 9.3 g/dL — ABNORMAL LOW (ref 13.0–17.0)
Immature Granulocytes: 2 %
Lymphocytes Relative: 12 %
Lymphs Abs: 2.2 10*3/uL (ref 0.7–4.0)
MCH: 31.6 pg (ref 26.0–34.0)
MCHC: 32.5 g/dL (ref 30.0–36.0)
MCV: 97.3 fL (ref 80.0–100.0)
Monocytes Absolute: 0.4 10*3/uL (ref 0.1–1.0)
Monocytes Relative: 2 %
Neutro Abs: 15.6 10*3/uL — ABNORMAL HIGH (ref 1.7–7.7)
Neutrophils Relative %: 83 %
Platelets: 151 10*3/uL (ref 150–400)
RBC: 2.94 MIL/uL — ABNORMAL LOW (ref 4.22–5.81)
RDW: 15.4 % (ref 11.5–15.5)
WBC: 18.8 10*3/uL — ABNORMAL HIGH (ref 4.0–10.5)
nRBC: 0 % (ref 0.0–0.2)

## 2023-01-18 LAB — RESP PANEL BY RT-PCR (RSV, FLU A&B, COVID)  RVPGX2
Influenza A by PCR: NEGATIVE
Influenza B by PCR: NEGATIVE
Resp Syncytial Virus by PCR: NEGATIVE
SARS Coronavirus 2 by RT PCR: NEGATIVE

## 2023-01-18 LAB — GLUCOSE, CAPILLARY
Glucose-Capillary: 120 mg/dL — ABNORMAL HIGH (ref 70–99)
Glucose-Capillary: 187 mg/dL — ABNORMAL HIGH (ref 70–99)
Glucose-Capillary: 196 mg/dL — ABNORMAL HIGH (ref 70–99)

## 2023-01-18 LAB — BLOOD GAS, ARTERIAL
Acid-base deficit: 1.7 mmol/L (ref 0.0–2.0)
Bicarbonate: 21.9 mmol/L (ref 20.0–28.0)
Drawn by: 22052
MECHVT: 470 mL
O2 Content: 100 L/min
O2 Saturation: 100 %
PEEP: 5 cmH2O
Patient temperature: 37
RATE: 24 {breaths}/min
pCO2 arterial: 33 mm[Hg] (ref 32–48)
pH, Arterial: 7.43 (ref 7.35–7.45)
pO2, Arterial: 424 mm[Hg] — ABNORMAL HIGH (ref 83–108)

## 2023-01-18 LAB — MRSA NEXT GEN BY PCR, NASAL: MRSA by PCR Next Gen: NOT DETECTED

## 2023-01-18 LAB — LACTIC ACID, PLASMA: Lactic Acid, Venous: 3.8 mmol/L (ref 0.5–1.9)

## 2023-01-18 LAB — BRAIN NATRIURETIC PEPTIDE
B Natriuretic Peptide: 237.6 pg/mL — ABNORMAL HIGH (ref 0.0–100.0)
B Natriuretic Peptide: 210 pg/mL — ABNORMAL HIGH (ref 0.0–100.0)

## 2023-01-18 LAB — PROCALCITONIN: Procalcitonin: 0.56 ng/mL

## 2023-01-18 LAB — CBG MONITORING, ED
Glucose-Capillary: 29 mg/dL — CL (ref 70–99)
Glucose-Capillary: 342 mg/dL — ABNORMAL HIGH (ref 70–99)

## 2023-01-18 LAB — TROPONIN I (HIGH SENSITIVITY)
Troponin I (High Sensitivity): 124 ng/L (ref <18)
Troponin I (High Sensitivity): 149 ng/L (ref <18)

## 2023-01-18 LAB — TSH: TSH: 3.775 u[IU]/mL (ref 0.350–4.500)

## 2023-01-18 MED ORDER — POLYETHYLENE GLYCOL 3350 17 G PO PACK: 17.0000 g | PACK | Freq: Every day | ORAL | Status: DC | PRN

## 2023-01-18 MED ORDER — SODIUM CHLORIDE 0.9 % IV BOLUS
1000.0000 mL | Freq: Once | INTRAVENOUS | Status: AC
  Administered 2023-01-18: 1000 mL via INTRAVENOUS

## 2023-01-18 MED ORDER — VANCOMYCIN HCL 1250 MG/250ML IV SOLN
1250.0000 mg | Freq: Once | INTRAVENOUS | Status: AC
Start: 2023-01-18 — End: 2023-01-18
  Administered 2023-01-18: 1250 mg via INTRAVENOUS
  Filled 2023-01-18: qty 250

## 2023-01-18 MED ORDER — METHYLPREDNISOLONE SODIUM SUCC 40 MG IJ SOLR
40.0000 mg | Freq: Every day | INTRAMUSCULAR | Status: DC
  Administered 2023-01-18 – 2023-01-22 (×5): 40 mg via INTRAVENOUS
  Filled 2023-01-18 (×5): qty 1

## 2023-01-18 MED ORDER — FENTANYL CITRATE PF 50 MCG/ML IJ SOSY
50.0000 ug | PREFILLED_SYRINGE | INTRAMUSCULAR | Status: DC | PRN
  Administered 2023-01-26 (×2): 50 ug via INTRAVENOUS
  Filled 2023-01-18 (×2): qty 1

## 2023-01-18 MED ORDER — DOCUSATE SODIUM 100 MG PO CAPS: 100.0000 mg | ORAL_CAPSULE | Freq: Two times a day (BID) | ORAL | Status: DC | PRN

## 2023-01-18 MED ORDER — AMIODARONE HCL IN DEXTROSE 360-4.14 MG/200ML-% IV SOLN
60.0000 mg/h | INTRAVENOUS | Status: AC
  Administered 2023-01-18 (×2): 60 mg/h via INTRAVENOUS
  Filled 2023-01-18 (×2): qty 200

## 2023-01-18 MED ORDER — LORAZEPAM 2 MG/ML IJ SOLN
2.0000 mg | INTRAMUSCULAR | Status: DC | PRN
  Administered 2023-01-18 – 2023-01-26 (×36): 2 mg via INTRAVENOUS
  Filled 2023-01-18 (×36): qty 1

## 2023-01-18 MED ORDER — FENTANYL CITRATE PF 50 MCG/ML IJ SOSY
50.0000 ug | PREFILLED_SYRINGE | INTRAMUSCULAR | Status: DC | PRN
  Administered 2023-01-18 (×2): 50 ug via INTRAVENOUS
  Filled 2023-01-18 (×5): qty 1

## 2023-01-18 MED ORDER — ORAL CARE MOUTH RINSE
15.0000 mL | OROMUCOSAL | Status: DC | PRN
Start: 2023-01-18 — End: 2023-01-26

## 2023-01-18 MED ORDER — AMIODARONE LOAD VIA INFUSION
150.0000 mg | Freq: Once | INTRAVENOUS | Status: AC
  Administered 2023-01-18: 150 mg via INTRAVENOUS
  Filled 2023-01-18: qty 83.34

## 2023-01-18 MED ORDER — IOHEXOL 350 MG/ML SOLN
75.0000 mL | Freq: Once | INTRAVENOUS | Status: AC | PRN
  Administered 2023-01-18: 75 mL via INTRAVENOUS

## 2023-01-18 MED ORDER — LORAZEPAM 2 MG/ML IJ SOLN
INTRAMUSCULAR | Status: AC
  Filled 2023-01-18: qty 1

## 2023-01-18 MED ORDER — IPRATROPIUM-ALBUTEROL 0.5-2.5 (3) MG/3ML IN SOLN: 3.0000 mL | Freq: Four times a day (QID) | RESPIRATORY_TRACT | Status: DC | PRN

## 2023-01-18 MED ORDER — POLYETHYLENE GLYCOL 3350 17 G PO PACK
17.0000 g | PACK | Freq: Every day | ORAL | Status: DC
  Administered 2023-01-19 – 2023-01-25 (×4): 17 g
  Filled 2023-01-18 (×6): qty 1

## 2023-01-18 MED ORDER — AMIODARONE HCL IN DEXTROSE 360-4.14 MG/200ML-% IV SOLN
30.0000 mg/h | INTRAVENOUS | Status: DC
  Administered 2023-01-19 – 2023-01-26 (×17): 30 mg/h via INTRAVENOUS
  Filled 2023-01-18 (×16): qty 200

## 2023-01-18 MED ORDER — PROPOFOL 1000 MG/100ML IV EMUL
0.0000 ug/kg/min | INTRAVENOUS | Status: DC
  Administered 2023-01-18: 40 ug/kg/min via INTRAVENOUS
  Administered 2023-01-18: 10 ug/kg/min via INTRAVENOUS
  Administered 2023-01-19: 30 ug/kg/min via INTRAVENOUS
  Administered 2023-01-19: 40 ug/kg/min via INTRAVENOUS
  Administered 2023-01-19: 30 ug/kg/min via INTRAVENOUS
  Administered 2023-01-19 – 2023-01-20 (×2): 40 ug/kg/min via INTRAVENOUS
  Administered 2023-01-20: 30 ug/kg/min via INTRAVENOUS
  Administered 2023-01-20 – 2023-01-21 (×4): 40 ug/kg/min via INTRAVENOUS
  Administered 2023-01-22: 35 ug/kg/min via INTRAVENOUS
  Administered 2023-01-22: 20 ug/kg/min via INTRAVENOUS
  Administered 2023-01-22: 25 ug/kg/min via INTRAVENOUS
  Administered 2023-01-22: 40 ug/kg/min via INTRAVENOUS
  Administered 2023-01-23 (×2): 35 ug/kg/min via INTRAVENOUS
  Administered 2023-01-23: 25 ug/kg/min via INTRAVENOUS
  Administered 2023-01-24: 50.109 ug/kg/min via INTRAVENOUS
  Administered 2023-01-24: 50 ug/kg/min via INTRAVENOUS
  Administered 2023-01-24: 35 ug/kg/min via INTRAVENOUS
  Administered 2023-01-24: 45 ug/kg/min via INTRAVENOUS
  Administered 2023-01-25 (×4): 50 ug/kg/min via INTRAVENOUS
  Administered 2023-01-26: 25 ug/kg/min via INTRAVENOUS
  Administered 2023-01-26: 35 ug/kg/min via INTRAVENOUS
  Filled 2023-01-18 (×29): qty 100

## 2023-01-18 MED ORDER — DOCUSATE SODIUM 50 MG/5ML PO LIQD
100.0000 mg | Freq: Two times a day (BID) | ORAL | Status: DC
  Administered 2023-01-19 – 2023-01-25 (×9): 100 mg
  Filled 2023-01-18 (×12): qty 10

## 2023-01-18 MED ORDER — IPRATROPIUM-ALBUTEROL 0.5-2.5 (3) MG/3ML IN SOLN
3.0000 mL | Freq: Once | RESPIRATORY_TRACT | Status: AC
  Administered 2023-01-18: 3 mL via RESPIRATORY_TRACT
  Filled 2023-01-18: qty 3

## 2023-01-18 MED ORDER — CHLORHEXIDINE GLUCONATE CLOTH 2 % EX PADS
6.0000 | MEDICATED_PAD | Freq: Every day | CUTANEOUS | Status: DC
  Administered 2023-01-18 – 2023-01-26 (×9): 6 via TOPICAL

## 2023-01-18 MED ORDER — METHYLPREDNISOLONE SODIUM SUCC 125 MG IJ SOLR
125.0000 mg | Freq: Once | INTRAMUSCULAR | Status: AC
  Administered 2023-01-18: 125 mg via INTRAVENOUS
  Filled 2023-01-18: qty 2

## 2023-01-18 MED ORDER — HEPARIN BOLUS VIA INFUSION
1500.0000 [IU] | Freq: Once | INTRAVENOUS | Status: AC
  Administered 2023-01-18: 1500 [IU] via INTRAVENOUS
  Filled 2023-01-18: qty 1500

## 2023-01-18 MED ORDER — LACTATED RINGERS IV BOLUS
1000.0000 mL | Freq: Once | INTRAVENOUS | Status: AC
  Administered 2023-01-18: 1000 mL via INTRAVENOUS

## 2023-01-18 MED ORDER — HEPARIN (PORCINE) 25000 UT/250ML-% IV SOLN
750.0000 [IU]/h | INTRAVENOUS | Status: DC
  Administered 2023-01-18: 900 [IU]/h via INTRAVENOUS
  Administered 2023-01-19: 800 [IU]/h via INTRAVENOUS
  Administered 2023-01-21 – 2023-01-25 (×4): 750 [IU]/h via INTRAVENOUS
  Filled 2023-01-18 (×6): qty 250

## 2023-01-18 MED ORDER — PERFLUTREN LIPID MICROSPHERE
1.0000 mL | INTRAVENOUS | Status: AC | PRN
  Administered 2023-01-18: 4 mL via INTRAVENOUS

## 2023-01-18 MED ORDER — REVEFENACIN 175 MCG/3ML IN SOLN
175.0000 ug | Freq: Every day | RESPIRATORY_TRACT | Status: DC
Start: 2023-01-18 — End: 2023-01-26
  Administered 2023-01-18 – 2023-01-26 (×9): 175 ug via RESPIRATORY_TRACT
  Filled 2023-01-18 (×10): qty 3

## 2023-01-18 MED ORDER — ORAL CARE MOUTH RINSE
15.0000 mL | OROMUCOSAL | Status: DC
  Administered 2023-01-18 – 2023-01-26 (×97): 15 mL via OROMUCOSAL

## 2023-01-18 MED ORDER — INSULIN ASPART 100 UNIT/ML IJ SOLN
0.0000 [IU] | INTRAMUSCULAR | Status: DC
  Administered 2023-01-18: 4 [IU] via SUBCUTANEOUS
  Administered 2023-01-20 – 2023-01-24 (×4): 3 [IU] via SUBCUTANEOUS
  Administered 2023-01-24: 4 [IU] via SUBCUTANEOUS
  Administered 2023-01-25: 3 [IU] via SUBCUTANEOUS
  Administered 2023-01-25: 4 [IU] via SUBCUTANEOUS
  Administered 2023-01-25: 7 [IU] via SUBCUTANEOUS
  Filled 2023-01-18: qty 0.2

## 2023-01-18 MED ORDER — PANTOPRAZOLE SODIUM 40 MG IV SOLR
40.0000 mg | Freq: Two times a day (BID) | INTRAVENOUS | Status: DC
  Administered 2023-01-18 – 2023-01-26 (×16): 40 mg via INTRAVENOUS
  Filled 2023-01-18 (×16): qty 10

## 2023-01-18 MED ORDER — SODIUM CHLORIDE 0.9 % IV SOLN
2.0000 g | Freq: Three times a day (TID) | INTRAVENOUS | Status: DC
  Administered 2023-01-18 – 2023-01-23 (×14): 2 g via INTRAVENOUS
  Filled 2023-01-18 (×14): qty 12.5

## 2023-01-18 MED ORDER — ARFORMOTEROL TARTRATE 15 MCG/2ML IN NEBU
15.0000 ug | INHALATION_SOLUTION | Freq: Two times a day (BID) | RESPIRATORY_TRACT | Status: DC
  Administered 2023-01-18 – 2023-01-26 (×16): 15 ug via RESPIRATORY_TRACT
  Filled 2023-01-18 (×16): qty 2

## 2023-01-18 MED ORDER — VANCOMYCIN HCL 1250 MG/250ML IV SOLN: 1250.0000 mg | INTRAVENOUS | Status: DC

## 2023-01-18 MED ORDER — LACTATED RINGERS IV SOLN: INTRAVENOUS | Status: AC

## 2023-01-18 MED ORDER — MAGNESIUM SULFATE 2 GM/50ML IV SOLN
2.0000 g | Freq: Once | INTRAVENOUS | Status: AC
  Administered 2023-01-18: 2 g via INTRAVENOUS
  Filled 2023-01-18: qty 50

## 2023-01-18 MED ORDER — LEVETIRACETAM IN NACL 1500 MG/100ML IV SOLN
1500.0000 mg | Freq: Once | INTRAVENOUS | Status: AC
  Administered 2023-01-18: 1500 mg via INTRAVENOUS
  Filled 2023-01-18: qty 100

## 2023-01-18 MED ORDER — METOPROLOL TARTRATE 5 MG/5ML IV SOLN
5.0000 mg | Freq: Once | INTRAVENOUS | Status: AC
Start: 2023-01-18 — End: 2023-01-18
  Administered 2023-01-18: 5 mg via INTRAVENOUS
  Filled 2023-01-18: qty 5

## 2023-01-18 MED ORDER — BUDESONIDE 0.5 MG/2ML IN SUSP
0.5000 mg | Freq: Two times a day (BID) | RESPIRATORY_TRACT | Status: DC
  Administered 2023-01-18 – 2023-01-26 (×16): 0.5 mg via RESPIRATORY_TRACT
  Filled 2023-01-18 (×16): qty 2

## 2023-01-18 MED ORDER — VASOPRESSIN 20 UNITS/100 ML INFUSION FOR SHOCK
0.0000 [IU]/min | INTRAVENOUS | Status: DC
Start: 2023-01-18 — End: 2023-01-19
  Administered 2023-01-18: 0.02 [IU]/min via INTRAVENOUS
  Filled 2023-01-18 (×2): qty 100

## 2023-01-18 MED FILL — Medication: Qty: 1 | Status: AC

## 2023-01-18 NOTE — ED Notes (Signed)
 A&O x 4. No complaints of pain or shortness of breath. No signs of distress. Spouse at bedside.

## 2023-01-18 NOTE — ED Triage Notes (Signed)
 PT arrives via EMS from home. PT was recently discharged from the hospital on the 8th. PT reports ongoing weakness, fatigue, and has a productive cough. Recently diagnosed with lung cancer. EMS reports patient is currently in Afib-rvr. Reports rate is 120-190s. Pt is AxOx4.

## 2023-01-18 NOTE — Progress Notes (Signed)
 eLink Physician-Brief Progress Note Patient Name: Scott Lindsey DOB: Dec 24, 1955 MRN: [id number]   Date of Service  01/18/2023  HPI/Events of Note  3.8 Lactic acid  eICU Interventions  IVF   Lactic acidosis Pneumonia Afib Resp faliure Lung mass/cancer On LR 125 ml/hr S/p PEA arrest  Will give IV bolus of LR 1L     [name] 01/18/2023, 7:10 PM

## 2023-01-18 NOTE — Progress Notes (Signed)
 Ceribell ordered by critical care team and neurology. Both teams notified of initiation at 1855.

## 2023-01-18 NOTE — Progress Notes (Signed)
 Pharmacy Antibiotic Note  Scott Lindsey is a 68 y.o. male admitted on 01/18/2023 with pneumonia and sepsis.  Pharmacy has been consulted for Vancomycin , Cefepime  dosing.  Plan: Cefepime  2g IV q8h Vancomycin  1250 mg IV q24h  (SCr 0.99, AUC 468) Measure Vanc levels as needed.  Goal AUC = 400 - 550.  Follow up renal function, culture results, and clinical course.   Height: 5' 8 (172.7 cm) Weight: 60.8 kg (134 lb) IBW/kg (Calculated) : 68.4  Temp (24hrs), Avg:97.9 F (36.6 C), Min:97.7 F (36.5 C), Max:98 F (36.7 C)  Recent Labs  Lab 01/13/23 0456 01/14/23 0421 01/15/23 0407 01/18/23 1130 01/18/23 1509  WBC 12.9* 11.7* 11.1*  --  18.8*  CREATININE 0.76 0.77  --  0.99  --     Estimated Creatinine Clearance: 60.6 mL/min (by C-G formula based on SCr of 0.99 mg/dL).    No Known Allergies  Antimicrobials this admission: 1/10 Vancomycin  >>  1/10 Cefepime  >>   Microbiology results: 1/10 Resp: neg covid, flu, rsv 1/10 Tracheal aspirate 1/10 MRSA PCR:   Thank you for allowing pharmacy to be a part of this patient's care.  [name] PharmD, BCPS [facility] main pharmacy [phone] 01/18/2023 4:26 PM

## 2023-01-18 NOTE — Progress Notes (Signed)
   01/18/23 1838  Vent Select  $ Transport Ventilator  (S)  Yes (Pt safely transported on vent, full support, full E cylider, 50% Fi02 from CT scan to ICU 1238, airway stable and secure.)  Adult Ventilator Settings  FiO2 (%) 40 % (Weaned to 40%.)  Richmond Agitation Sedation Scale  Richmond Agitation Sedation Scale (RASS) -1

## 2023-01-18 NOTE — ED Notes (Signed)
 Patient noted to have decorticate posturing, tongue protruding from mouth with bright red blood and unresponsive. Airway suctioned, patient noted to be breathing very slow and shallow. Provider at bedside.  1355: Respirations ceased. Began bagging with BVM. Code called. Pulse palpable. ST on monitor.  1400: Patient became bradycardic and pulse not palpable. CPR inititated immediately by provider. RT in to assist with BVM and prepare for intubation.  1401: Epi given. PEA noted. CPR continues.  1403 Bicarb given. PEA noted. CPR continues. 1404: NS liter bolus initiated. Epi given. PEA noted. CPR continues.  1405: Calcium  given.  1405: Bicarb given 1406: PEA noted. CPR continues.  1406: Intubated with 7.5 ET tube, 23 at lip by Dr. Elnor.  1407: Epi given. PEA noted. CPR continues.  1410: Epi given. PEA noted. CPR continues.  1411: ROSC noted. Tachy arrhythmia on monitor. Central pulses strong when palpated. 1412: CBG 29, 1 amp D50 given. (Noted we did not repeat to verify and CBG of 29 may have been inaccurate due to circumstance, but was appropriate in emergency situation)

## 2023-01-18 NOTE — Progress Notes (Addendum)
 Patient taken to CT and then transported to the ICU without incident. Patient maintained SPO2 99% during this time and tolerated well. Airway patent and patient stable. Ambu bag at [facility] entire time

## 2023-01-18 NOTE — Telephone Encounter (Signed)
 Patient's wife left a VM around 08:45 stating that patient didn't seem to be doing well and she wasn't sure whether or not she should call EMS to have patient brought back to the hospital.   Returned call and spoke with wife to get more details of patient's status. Wife stated that she went ahead and called EMS, and they were currently at their house assessing patient. Paramedics informed wife that they felt patient needed to be evaluated in the ED and they would take him to [facility] shortly. Thanked patient's wife for the update and informed her that I would let patient's oncology team know of patient's status given that he is scheduled to start radiation Monday 1/13 and meet with medical oncology on 1/14. Wife verbalized understanding and appreciation of return call.

## 2023-01-18 NOTE — Progress Notes (Signed)
   01/18/23 1810  Vent Select  $ Transport Ventilator  (S)  Yes (RT to transport pt from ICU 1238 to CT scan on full support, full E cylinders @ 50% FI02. Airway stable.)

## 2023-01-18 NOTE — Progress Notes (Signed)
 PHARMACY - ANTICOAGULATION CONSULT NOTE  Pharmacy Consult for IV heparin  Indication: Afib, possible r/o PE  No Known Allergies  Patient Measurements: Height: 5' 8 (172.7 cm) Weight: 60.8 kg (134 lb) IBW/kg (Calculated) : 68.4 Heparin  Dosing Weight: TBW  Vital Signs: Temp: 98 F (36.7 C) (01/10 1237) Temp Source: Oral (01/10 1237) BP: 134/71 (01/10 1330) Pulse Rate: 150 (01/10 1059)  Labs: Recent Labs    01/18/23 1130 01/18/23 1509  HGB  --  9.3*  HCT  --  28.6*  PLT  --  151  CREATININE 0.99  --     Estimated Creatinine Clearance: 60.6 mL/min (by C-G formula based on SCr of 0.99 mg/dL).   Medical History: Past Medical History:  Diagnosis Date   Back pain     Medications:  (Not in a hospital admission)  Scheduled:   amiodarone   150 mg Intravenous Once   LORazepam        PRN: fentaNYL  (SUBLIMAZE ) injection, fentaNYL  (SUBLIMAZE ) injection, iohexol , LORazepam   Assessment: 71 yoM with PMH COPD, EtOH use, HTN, symptomatic PVCs, new Dx SCC of R lung who presents 1/10 with fatigue, chest tightness, and SOB. Noted to be in Afib here and during a recent admission. No prior anticoagulation. Pharmacy to start IV heparin  for Afib (also note PE studies pending d/t pulmonary symptoms, as well as head CT to r/o brain mets)  Baseline INR, aPTT: not done Prior anticoagulation: none (did get Lovenox  40 on 1/8 PM)  Significant events: 1/10 afternoon: still awaiting CBC results prior to starting heparin . At 14:20, patient found possibly seizing; bit tongue which led to copious bleeding. Patient aspirated on blood and subsequently suffered cardiac arrest, requiring a brief course of CPR prior to ROSC. Intubated and admitted to ICU. Remains in Afib  Today, 01/18/2023: CBC: Hgb low but relatively consistent with recent baseline Currently awaiting results of head CT (to r/o possible mets) prior to starting heparin  SCr WNL, although some degree of AKI after cardiac arrest would not  be unexpected.   Goal of Therapy: Heparin  level 0.3-0.7 units/ml Monitor platelets by anticoagulation protocol: Yes  Plan: F/u CT head results (and resolution of tongue bleeding) prior to starting heparin  F/u CTA results to guide heparin  dosing (AFib vs PE+Afib); bolusing only if appropriate and current bleeding fully controlled Daily CBC, daily heparin  level once stable Monitor for signs of bleeding or thrombosis  Bard Jeans, PharmD, BCPS [phone] 01/18/2023, 3:42 PM

## 2023-01-18 NOTE — Progress Notes (Signed)
 CT reviewed No obvious bleed--.will start heparin Off pressors -->will hold off on central access

## 2023-01-18 NOTE — Progress Notes (Addendum)
 eLink Physician-Brief Progress Note Patient Name: Scott Lindsey DOB: 1955-03-15 MRN: [id number]   Date of Service  01/18/2023  HPI/Events of Note    eICU Interventions      OG tip in body of Stomach  Good to use   Myoclonic jerks  Will give ativan  2 mg now and prn      Raif Chachere 01/18/2023, 10:17 PM

## 2023-01-18 NOTE — H&P (Addendum)
 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  Has yet to start therapy. Presented to the [facility] emergency room on 1/10 with chief complaint worsening weakness, fatigue, and gasping for air this morning not able to tolerate really any activity.  EMS was called he was found to be in new A-fib/RVR with heart rate in the 120s to 190s, he was hypoxic and found to have pulse oximetry in the 70s with marked increased work of breathing. While in the ER he remained hypoxic, while staff was working on support patient had sudden episode of what appeared to be seizure, he bit his tongue, shortly after this suffered a PEA cardiac arrest.  Time to return of spontaneous circulation estimated at 11 minutes with ongoing ACLS.  On critical care arrival the patient was hypotensive following 1 L of crystalloid, still continued to demonstrate agonal respiratory efforts on the ventilator.  There were bloody endotracheal tube secretions, patient on propofol , withdrawing to pain in all extremities ER evaluation at time of admission HCO3 20 COVID-negative normal renal function, initial chest x-ray with previously noted right lung base opacity and bibasilar airspace disease Pertinent  Medical History  COPD, pulmonary fibrosis, prior alcoholic pancreatitis, history of remote alcohol  abuse, history of prostate cancer, hypertension, HLD, newly diagnosed metastatic squamous cell carcinoma felt lung primary site with metastasis to liver and  lymph nodes  Significant Hospital Events: Including procedures, antibiotic start and stop dates in addition to other pertinent events   1/10 just discharged on the eighth following admission for pneumonia, COPD exacerbation, and new diagnosis of squamous cell carcinoma, felt pulmonary as primary with metastasis to lymph nodes and liver presents acutely hypoxic with pulse oximetry in the 70s, marked work of breathing gasping for air, and new atrial fibrillation with RVR shortly after presentation to the ER suffered PEA cardiac arrest times ROSC estimated at 11 minutes.  Noted to be somewhat difficult intubation due to blood in airway, patient having bit tongue.  Also ER staff reporting concern about possible aspiration of dentures  Interim History / Subjective:  Hypotensive, tachypneic, accessory use noted.  Currently working on sedation and volume resuscitation  Objective   Blood pressure 134/71, pulse (!) 150, temperature 98 F (36.7 C), temperature source Oral, resp. rate 20, SpO2 93%.        Intake/Output Summary (Last 24 hours) at 01/18/2023 1502 Last data filed at 01/18/2023 1307 Gross per 24 hour  Intake 43.76 ml  Output --  Net 43.76 ml   There were no vitals filed for this visit.  Examination: General: Critically ill 68 year old male patient currently sedated on propofol  post cardiac arrest we have decreased the dosing with postintubation postarrest hypotension HENT: Orally intubated marked bloody secretions in mouth, as well as an endotracheal tube Lungs: Diffuse scattered rhonchi with accessory use and high minute ventilation respiratory rate in the mid 20s with agonal efforts in spite of mechanical ventilation postintubation chest x-ray shows known right basilar opacity, right basilar airspace disease, endotracheal tube in satisfactory position.  I do  not see evidence of his denture partials on his current imaging although he does have CPR pad blocking some of his view Cardiac  irregular irregular, atrial fibrillation with rapid ventricular response Abdomen soft not tender Extremities are cool, pulses are palpable, no significant edema GU due to void Neuro prior to cardiac arrest was awake and oriented x 4, now withdraws to pain in both uppers and lowers, does not open eyes to voice or pain, no significant cough to suction pupils equal reactive  Resolved Hospital Problem list     Assessment & Plan:  Acute hypoxic respiratory failure ETIOLOGY PENDING  but given known history of lung cancer and recent hospitalization he is a high risk for pulmonary emboli, suspect also aspirated known h/o COPD and newly diagnosed metastatic squamous cell carcinoma (presumed lung primary w/ mets to liver and LNs) Plan Stat CT angiogram Follow-up arterial blood gas and adjust minute ventilation and supplemental oxygen  as indicated Add scheduled bronchodilators Systemic steroids 40 mg Solu-Medrol  daily x 7 days then taper VAP bundle PAD protocol with RASS goal -2 Sputum culture Start empiric antibiotics given recent hospitalization will cover for nosocomial's Holding off on systemic heparin  until PE study given he is already aspirated blood Admit to the intensive care  S/p PEA cardiac arrest.  Etiology not clear, presented acutely hypoxic, given history of lung cancer and recent hospitalization pulmonary emboli high on differential diagnosis, also consider new onset seizure from stroke and A-fib resulting in postseizure worsening hypoxia Plan Admit to the intensive care Stat echocardiogram Avoid fever Keep euglycemic CT head Will need spot EEG  Circulatory shock status postcardiac arrest.  Suspect at least somewhat due to sedating medications and relative hypovolemia given his response to volume resuscitation, but I worry about obstructive shock with cardiogenic component Plan Holding his home antihypertensives this includes his clonidine , Cozaar , and Lopressor  Provide  another liter of crystalloid to meet his 30 mL/kg predicted body weight Check lactic acid Stat echocardiogram Stat CT angiogram Will start vasopressin  given high level of suspicion that this is from massive pulmonary emboli Given the fact he is already aspirated blood I worry he is not a good systemic even half dose lytic candidate If we can support hemodynamics with current interventions might be reasonable to consider catheter directed thrombolysis  A-fib with RVR.  Suspect this is secondary to his acute illness Plan Continue telemetry monitoring Treating primary problems Getting CT of head, twelve-lead, echo, and CT of angio start amiodarone  Will prob need AC need to wait and clear head first   Acute metabolic encephalopathy with witnessed seizure in the emergency room Presented alert and oriented x 4 but hypoxic gasping for air, acutely seized, then progressed to agonal respiratory efforts. Plan CT head Avoiding fever, keep euglycemic, MAP goal greater than 65 EEG vs cerebri (will touch base w/ neuro) Hold off on heparin  until CT head complete  Possible swallowing versus aspiration of partial denture Plan Follow-up CT imaging, currently nothing on plain imaging  Anemia with bloody oral secretions after biting tongue Current hemoglobin not reflecting bloody oral secretions as was obtained prior to seizure Plan Elevate head of bed Suction mouth as needed Repeat CBC This could present a problem if requires anticoagulation  Steroid-induced hyperglycemia Plan Sliding scale insulin   Best Practice (right click and Reselect all SmartList Selections daily)   Diet/type: NPO DVT prophylaxis other Pressure ulcer(s): N/A GI prophylaxis: PPI Lines: N/A Foley:  Yes, and it is still needed Code Status:  full code Last date  of multidisciplinary goals of care discussion [pending]  Labs   CBC: Recent Labs  Lab 01/13/23 0456 01/14/23 0421 01/15/23 0407  WBC 12.9* 11.7*  11.1*  NEUTROABS 8.6* 7.7  --   HGB 10.0* 9.6* 10.1*  HCT 30.7* 30.9* 32.2*  MCV 96.2 98.7 98.8  PLT 175 182 187    Basic Metabolic Panel: Recent Labs  Lab 01/13/23 0456 01/14/23 0421 01/18/23 1130  NA 134* 135 135  K 3.9 3.9 4.7  CL 103 102 103  CO2 24 23 20*  GLUCOSE 113* 96 82  BUN 18 17 14   CREATININE 0.76 0.77 0.99  CALCIUM  9.8 9.8 10.2  MG 1.7 1.8  --    GFR: Estimated Creatinine Clearance: 61 mL/min (by C-G formula based on SCr of 0.99 mg/dL). Recent Labs  Lab 01/13/23 0456 01/14/23 0421 01/15/23 0407  WBC 12.9* 11.7* 11.1*    Liver Function Tests: No results for input(s): AST, ALT, ALKPHOS, BILITOT, PROT, ALBUMIN in the last 168 hours. No results for input(s): LIPASE, AMYLASE in the last 168 hours. No results for input(s): AMMONIA in the last 168 hours.  ABG No results found for: PHART, PCO2ART, PO2ART, HCO3, TCO2, ACIDBASEDEF, O2SAT   Coagulation Profile: No results for input(s): INR, PROTIME in the last 168 hours.  Cardiac Enzymes: No results for input(s): CKTOTAL, CKMB, CKMBINDEX, TROPONINI in the last 168 hours.  HbA1C: No results found for: HGBA1C  CBG: Recent Labs  Lab 01/18/23 1410 01/18/23 1424  GLUCAP 29* 342*    Review of Systems:   Not able  Past Medical History:  He,  has a past medical history of Back pain.   Surgical History:   Past Surgical History:  Procedure Laterality Date   MULTIPLE TOOTH EXTRACTIONS     ROBOT ASSISTED LAPAROSCOPIC RADICAL PROSTATECTOMY  12/13/2010   Procedure: ROBOTIC ASSISTED LAPAROSCOPIC RADICAL PROSTATECTOMY;  Surgeon: Alm GORMAN Fragmin, MD;  Location: [facility] ORS;  Service: Urology;  Laterality: N/A;  with Bilateral Pelivic Lymph Node Dissection     Social History:   reports that he has been smoking cigarettes. He has a 12.5 pack-year smoking history. He has never used smokeless tobacco. He reports that he does not currently use alcohol  after a past usage of  about 10.0 standard drinks of alcohol  per week. He reports that he does not use drugs.   Family History:  His family history includes Prostate cancer in his brother.   Allergies No Known Allergies   Home Medications  Prior to Admission medications   Medication Sig Start Date End Date Taking? Authorizing Provider  albuterol  (VENTOLIN  HFA) 108 (90 Base) MCG/ACT inhaler Inhale 2 puffs into the lungs every 6 (six) hours as needed for wheezing or shortness of breath. 10/12/22   Norleen Lynwood ORN, MD  arformoterol  (BROVANA ) 15 MCG/2ML NEBU Take 2 mLs (15 mcg total) by nebulization 2 (two) times daily. 01/16/23   Leotis Bogus, MD  atorvastatin  (LIPITOR) 20 MG tablet Take 1 tablet (20 mg total) by mouth daily. 12/11/22   Burnard Debby LABOR, MD  budesonide  (PULMICORT ) 0.25 MG/2ML nebulizer solution Take 2 mLs (0.25 mg total) by nebulization 2 (two) times daily. 01/17/23   Leotis Bogus, MD  budesonide -formoterol  (SYMBICORT ) 160-4.5 MCG/ACT inhaler Inhale 2 puffs into the lungs 2 (two) times daily. 01/17/23 02/16/23  Leotis Bogus, MD  cloNIDine  (CATAPRES ) 0.1 MG tablet TAKE 1 TABLET BY MOUTH THREE TIMES DAILY Patient taking differently: Take 0.1 mg by mouth See admin instructions. Take 0.1 mg by mouth one to  two times a day 07/17/22   Kate Lonni CROME, MD  folic acid  (FOLVITE ) 1 MG tablet Take 1 tablet by mouth once daily 10/20/22   Joshua Debby CROME, MD  guaiFENesin -dextromethorphan (ROBITUSSIN DM) 100-10 MG/5ML syrup Take 5 mLs by mouth every 4 (four) hours as needed for cough. 01/16/23   Leotis Bogus, MD  losartan  (COZAAR ) 100 MG tablet Take 1 tablet by mouth once daily Patient taking differently: Take 100 mg by mouth daily. 12/11/22   Burnard Debby LABOR, MD  magnesium  oxide (MAG-OX) 400 MG tablet Take 1 tablet (400 mg total) by mouth daily. Patient taking differently: Take 100 mg by mouth in the morning, at noon, in the evening, and at bedtime. 12/25/22   Kate Lonni CROME, MD  metoprolol  tartrate  (LOPRESSOR ) 50 MG tablet Take 1 tablet (50 mg total) by mouth 2 (two) times daily. 01/16/23 02/15/23  Leotis Bogus, MD  multivitamin (ONE-A-DAY MEN'S) TABS tablet Take 1 tablet by mouth daily with breakfast.    [provider]  oxyCODONE  (OXY IR/ROXICODONE ) 5 MG immediate release tablet Take 1 tablet (5 mg total) by mouth every 4 (four) hours as needed for up to 3 days for moderate pain (pain score 4-6). 01/16/23 01/19/23  Leotis Bogus, MD  predniSONE  (DELTASONE ) 20 MG tablet Take 1 tablet (20 mg total) by mouth daily with breakfast. 01/17/23 02/16/23  Leotis Bogus, MD     Critical care time: 45 min

## 2023-01-18 NOTE — Progress Notes (Addendum)
 Negative for PE  Does have marked almost near obstruction of the Right PA so possibly the new AF in setting of increased PH from narrowing related to mass percipitated arrest? Still waiting on CT head and starting Cereibell. ABG reviewed. No changes here Start IV heparin  once we know no bleed in brain Will need CVL Echo results called back EF 40-45% Severe RV dysfxn Prob severe PH Small pericardial effusion    Maude FORBES Banner ACNP-BC [facility] Pulmonary/Critical Care Pager # [phone] OR # [phone] if no answer

## 2023-01-18 NOTE — Progress Notes (Signed)
 PHARMACY - ANTICOAGULATION CONSULT NOTE  Pharmacy Consult for Heparin  Indication: atrial fibrillation  No Known Allergies  Patient Measurements: Height: 5' 8 (172.7 cm) Weight: 60.6 kg (133 lb 9.6 oz) IBW/kg (Calculated) : 68.4 Heparin  Dosing Weight: TBW  Vital Signs: Temp: 98.1 F (36.7 C) (01/10 1830) Temp Source: Core (01/10 1733) BP: 157/99 (01/10 1830) Pulse Rate: 104 (01/10 1733)  Labs: Recent Labs    01/18/23 1130 01/18/23 1509 01/18/23 1754  HGB  --  9.3* 8.6*  HCT  --  28.6* 26.4*  PLT  --  151 132*  CREATININE 0.99  --   --   TROPONINIHS  --  124*  --     Estimated Creatinine Clearance: 60.4 mL/min (by C-G formula based on SCr of 0.99 mg/dL).   Medical History: Past Medical History:  Diagnosis Date   Back pain     Medications:  Scheduled:   arformoterol   15 mcg Nebulization BID   budesonide  (PULMICORT ) nebulizer solution  0.5 mg Nebulization BID   Chlorhexidine  Gluconate Cloth  6 each Topical Daily   docusate  100 mg Per Tube BID   insulin  aspart  0-20 Units Subcutaneous Q4H   LORazepam        methylPREDNISolone  (SOLU-MEDROL ) injection  40 mg Intravenous Daily   mouth rinse  15 mL Mouth Rinse Q2H   pantoprazole  (PROTONIX ) IV  40 mg Intravenous Q12H   polyethylene glycol  17 g Per Tube Daily   revefenacin   175 mcg Nebulization Daily   Infusions:   amiodarone  60 mg/hr (01/18/23 1838)   Followed by   amiodarone      ceFEPime  (MAXIPIME ) IV 2 g (01/18/23 1846)   lactated ringers      propofol  (DIPRIVAN ) infusion 40 mcg/kg/min (01/18/23 1838)   vancomycin      vasopressin  Stopped (01/18/23 1814)    Assessment: 8 yoM presented to ED on 1/10 with fatigue, chest tightness, and SOB. Noted to be in Afib here and during a recent admission. Pharmacy consulted to start IV heparin  for Afib. PMH significant for COPD, EtOH use, HTN, symptomatic PVCs, new Dx SCC of R lung.  No prior anticoagulation except Lovenox  40mg  on 1/8.    Significant events: 1/10  afternoon: still awaiting CBC results prior to starting heparin . At 14:20, patient found possibly seizing; bit tongue which led to copious bleeding. Patient aspirated on blood and subsequently suffered cardiac arrest, requiring a brief course of CPR prior to ROSC. Intubated and admitted to ICU. Remains in Afib CBC: Hgb low/decreased to 8.6, but relatively consistent with recent baseline 9-10s.  Plt down to 132.  CTa negative for PE, CT head negative for bleeding.   Per Jeralyn Banner, NP, OK to start heparin  on 1/10 at 19:00 after CTs and labs were completed and reviewed.     Goal of Therapy:  Heparin  level 0.3-0.7 units/ml Monitor platelets by anticoagulation protocol: Yes   Plan:  Give heparin  1500 units bolus IV x 1 (lower bolus d/t recent bleeding) Start heparin  IV infusion at 900 units/hr Heparin  level 6 hours after starting Daily heparin  level and CBC Monitor for bleeding or complications.    [name] PharmD, BCPS [facility] main pharmacy [phone] 01/18/2023 6:59 PM

## 2023-01-18 NOTE — ED Provider Notes (Signed)
 Why EMERGENCY DEPARTMENT AT [facility] Provider Note  CSN: [id number] Arrival date & time: 01/18/23 1043  Chief Complaint(s) Atrial Fibrillation and Weakness  HPI Scott Lindsey is a 68 y.o. male with past medical history as below, significant for COPD, no longer smoking as of 2 weeks ago, alcohol  abuse, symptomatic PVCs, hypertension, squamous cell carcinoma of right lung newly diagnosed who presents to the ED with complaint of exertional dyspnea, fatigue, chest tightness   Was recently discharged 1/8 following COPD exacerbation, newly diagnosed lung cancer with metastatic disease with effacement of the trachea and SVC.  He presents today for exertional dyspnea with minimal exertion.  Walked to short steps and had to sit down and catch his breath.  Per spouse was gasping for air at this point.  Has been having cough over the past couple days, [place] family members with URI type symptoms including the spouse who is in the room with the patient.  No fevers, chills, nausea or vomiting.  He is scheduled to start radiation treatment on Monday with Dr. Gatha.   Reports he has had PVCs in the past but often times cannot tell when he is experiencing them.  He was seen by cardiology previously for symptomatic PVCs, he is on metoprolol .  During recent hospitalization had EKG with atrial fibrillation, his metoprolol  was increased at that time.  He is not anticoagulated   Past Medical History Past Medical History:  Diagnosis Date   Back pain    Patient Active Problem List   Diagnosis Date Noted   Acute respiratory failure (HCC) 01/18/2023   Cardiac arrest (HCC) 01/18/2023   Squamous cell carcinoma of middle lobe of right lung (HCC) 01/16/2023   Protein-calorie malnutrition, severe 01/14/2023   Lung mass 01/07/2023   COPD (chronic obstructive pulmonary disease) (HCC) 01/06/2023   COPD exacerbation (HCC) 01/06/2023   Cough 10/12/2022   Wheezing 10/12/2022   Abnormal breath  sounds 10/12/2022   Weight loss 08/02/2022   Subacute cough 01/25/2022   Thrombocytopenia (HCC) 01/25/2022   Pulmonary fibrosis (HCC) 01/25/2022   Bradycardia 08/03/2021   Elevated total protein 04/28/2021   Alcohol  withdrawal syndrome without complication (HCC) 04/13/2021   Normocytic anemia 04/10/2021   Simple chronic bronchitis (HCC) 08/11/2020   Need for shingles vaccine 08/11/2020   Atherosclerosis of aorta (HCC) 08/09/2020   Alcohol -induced acute pancreatitis without infection or necrosis 07/05/2020   Symptomatic PVCs 03/17/2019   Hypomagnesemia 03/17/2019   Drug-induced hypokalemia 08/20/2018   Dietary folate deficiency anemia 09/26/2017   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 Authorizing Provider  albuterol  (VENTOLIN  HFA) 108 (90 Base) MCG/ACT inhaler Inhale 2 puffs into the lungs every 6 (six) hours as needed for wheezing or shortness of breath. 10/12/22   Norleen Lynwood ORN, MD  arformoterol  (BROVANA ) 15 MCG/2ML NEBU Take 2 mLs (15 mcg total) by nebulization 2 (two) times daily. 01/16/23   Leotis Bogus, MD  atorvastatin  (LIPITOR) 20 MG tablet Take 1 tablet (20 mg total) by mouth daily. 12/11/22   Burnard Debby LABOR, MD  budesonide  (PULMICORT ) 0.25 MG/2ML nebulizer solution Take 2 mLs (0.25 mg total) by nebulization 2 (two) times daily. 01/17/23   Leotis Bogus, MD  budesonide -formoterol  (SYMBICORT ) 160-4.5 MCG/ACT inhaler Inhale 2 puffs into the lungs 2 (two) times daily. 01/17/23 02/16/23   Leotis Bogus, MD  cloNIDine  (CATAPRES ) 0.1 MG tablet TAKE 1 TABLET BY MOUTH THREE TIMES DAILY Patient taking differently: Take 0.1 mg by mouth See admin instructions. Take 0.1 mg by mouth one to two times a day 07/17/22   Kate Lonni CROME, MD  folic acid  (FOLVITE ) 1 MG tablet Take 1 tablet by mouth once daily 10/20/22   Joshua Debby CROME, MD  guaiFENesin -dextromethorphan (ROBITUSSIN DM) 100-10 MG/5ML syrup Take 5 mLs by mouth every 4 (four) hours as needed for cough. 01/16/23   Leotis Bogus, MD  losartan  (COZAAR ) 100 MG tablet Take 1 tablet by mouth once daily Patient taking differently: Take 100 mg by mouth daily. 12/11/22   Burnard Debby LABOR, MD  magnesium  oxide (MAG-OX) 400 MG tablet Take 1 tablet (400 mg total) by mouth daily. Patient taking differently: Take 100 mg by mouth in the morning, at noon, in the evening, and at bedtime. 12/25/22   Kate Lonni CROME, MD  metoprolol  tartrate (LOPRESSOR ) 50 MG tablet Take 1 tablet (50 mg total) by mouth 2 (two) times daily. 01/16/23 02/15/23  Leotis Bogus, MD  multivitamin (ONE-A-DAY MEN'S) TABS tablet Take 1 tablet by mouth daily with breakfast.    [provider]  oxyCODONE  (OXY IR/ROXICODONE ) 5 MG immediate release tablet Take 1 tablet (5 mg total) by mouth every 4 (four) hours as needed for up to 3 days for moderate pain (pain score 4-6). 01/16/23 01/19/23  Leotis Bogus, MD  predniSONE  (DELTASONE ) 20 MG tablet Take 1 tablet (20 mg total) by mouth daily with breakfast. 01/17/23 02/16/23  Leotis Bogus, MD                                                                                                                                    Past Surgical History Past Surgical History:  Procedure Laterality Date   MULTIPLE TOOTH EXTRACTIONS     ROBOT ASSISTED LAPAROSCOPIC RADICAL PROSTATECTOMY  12/13/2010   Procedure: ROBOTIC ASSISTED LAPAROSCOPIC RADICAL PROSTATECTOMY;  Surgeon: Alm GORMAN Fragmin, MD;  Location: [facility];  Service: Urology;   Laterality: N/A;  with Bilateral Pelivic Lymph Node Dissection   Family History Family History  Problem Relation Age of Onset   Prostate cancer Brother     Social History Social History   Tobacco Use   Smoking status: Every Day    Current packs/day: 0.50    Average packs/day: 0.5 packs/day for 25.0 years (12.5 ttl pk-yrs)    Types: Cigarettes   Smokeless tobacco: Never  Substance Use Topics   Alcohol  use: Not Currently    Alcohol /week: 10.0 standard drinks of alcohol     Types: 10 Glasses of wine per week  Comment: occasional   Drug use: No   Allergies Patient has no known allergies.  Review of Systems Review of Systems  Constitutional:  Negative for fever.  HENT:  Positive for congestion.   Respiratory:  Positive for cough, chest tightness and shortness of breath. Negative for wheezing.   Cardiovascular:  Negative for chest pain and palpitations.  Gastrointestinal:  Negative for abdominal pain, nausea and vomiting.  Genitourinary:  Negative for dysuria.  Musculoskeletal:  Negative for arthralgias.  Neurological:  Negative for light-headedness and headaches.  All other systems reviewed and are negative.   Physical Exam Vital Signs  I have reviewed the triage vital signs BP 105/73   Pulse (!) 123   Temp 98 F (36.7 C) (Oral)   Resp (!) 26   Ht 5' 8 (1.727 m)   Wt 60.8 kg   SpO2 90%   BMI 20.37 kg/m  Physical Exam Vitals and nursing note reviewed.  Constitutional:      General: He is not in acute distress.    Appearance: He is well-developed.  HENT:     Head: Normocephalic and atraumatic.     Right Ear: External ear normal.     Left Ear: External ear normal.     Mouth/Throat:     Mouth: Mucous membranes are moist.  Eyes:     General: No scleral icterus. Cardiovascular:     Rate and Rhythm: Tachycardia present. Rhythm irregular.     Pulses: Normal pulses.     Heart sounds: Normal heart sounds.  Pulmonary:     Effort: Pulmonary effort is normal. No  respiratory distress.     Breath sounds: Decreased breath sounds and wheezing present.  Abdominal:     General: Abdomen is flat.     Palpations: Abdomen is soft.     Tenderness: There is no abdominal tenderness.  Musculoskeletal:     Cervical back: No rigidity.     Right lower leg: No edema.     Left lower leg: No edema.  Skin:    General: Skin is warm and dry.     Capillary Refill: Capillary refill takes less than 2 seconds.  Neurological:     Mental Status: He is alert and oriented to person, place, and time.     GCS: GCS eye subscore is 4. GCS verbal subscore is 5. GCS motor subscore is 6.  Psychiatric:        Mood and Affect: Mood normal.        Behavior: Behavior normal.     ED Results and Treatments Labs (all labs ordered are listed, but only abnormal results are displayed) Labs Reviewed  BASIC METABOLIC PANEL - Abnormal; Notable for the following components:      Result Value   CO2 20 (*)    All other components within normal limits  CBC WITH DIFFERENTIAL/PLATELET - Abnormal; Notable for the following components:   WBC 18.8 (*)    RBC 2.94 (*)    Hemoglobin 9.3 (*)    HCT 28.6 (*)    Neutro Abs 15.6 (*)    Abs Immature Granulocytes 0.45 (*)    All other components within normal limits  CBG MONITORING, ED - Abnormal; Notable for the following components:   Glucose-Capillary 29 (*)    All other components within normal limits  CBG MONITORING, ED - Abnormal; Notable for the following components:   Glucose-Capillary 342 (*)    All other components within normal limits  RESP PANEL BY RT-PCR (RSV, FLU  A&B, COVID)  RVPGX2  CULTURE, RESPIRATORY W GRAM STAIN  CBC WITH DIFFERENTIAL/PLATELET  TSH  BRAIN NATRIURETIC PEPTIDE  BLOOD GAS, ARTERIAL  HEMOGLOBIN A1C  HIV ANTIBODY (ROUTINE TESTING W REFLEX)  PROCALCITONIN  BRAIN NATRIURETIC PEPTIDE  CBC  I-STAT CG4 LACTIC ACID, ED  TROPONIN I (HIGH SENSITIVITY)  TROPONIN I (HIGH SENSITIVITY)                                                                                                                           Radiology DG CHEST PORT 1 VIEW Result Date: 01/18/2023 CLINICAL DATA:  ET tube. EXAM: PORTABLE CHEST 1 VIEW COMPARISON:  X-ray 01/18/2023 earlier FINDINGS: Interval placement of enteric tube with tip extending beneath the diaphragm. ET tube in place with tip seen proximally 5 cm above the carina. Overlapping cardiac leads and defibrillator pads. Hyperinflation with diffuse interstitial changes. Nodular opacity seen in the right lung base once again. Question tiny right effusion. No pneumothorax. Stable cardiopericardial silhouette. Widened mediastinum. IMPRESSION: New ET tube and enteric tube. Electronically Signed   By: Ranell Bring M.D.   On: 01/18/2023 14:41   DG Chest Port 1 View Result Date: 01/18/2023 CLINICAL DATA:  Atrial fibrillation. EXAM: PORTABLE CHEST 1 VIEW COMPARISON:  Chest CT dated 01/06/2023. FINDINGS: Bibasilar reticular changes/scarring. A 3.7 cm focal opacity at the right lung base as well as faint area of subpleural density in the lateral right mid lung field correspond to the consolidative changes and nodule seen on the CT. No new consolidation. There is no pleural effusion pneumothorax. Stable cardiac silhouette. No acute osseous pathology. IMPRESSION: 1. No acute cardiopulmonary process. 2. Right lung nodule and consolidative changes as seen on the CT. Electronically Signed   By: Vanetta Chou M.D.   On: 01/18/2023 11:52    Pertinent labs & imaging results that were available during my care of the patient were reviewed by me and considered in my medical decision making (see MDM for details).  Medications Ordered in ED Medications  LORazepam  (ATIVAN ) 2 MG/ML injection (has no administration in time range)  fentaNYL  (SUBLIMAZE ) injection 50 mcg (has no administration in time range)  fentaNYL  (SUBLIMAZE ) injection 50 mcg (has no administration in time range)  propofol  (DIPRIVAN ) 1000  MG/100ML infusion (10 mcg/kg/min  61.2 kg Intravenous New Bag/Given 01/18/23 1440)  levETIRAcetam  (KEPPRA ) IVPB 1500 mg/ 100 mL premix (has no administration in time range)  amiodarone  (NEXTERONE ) 1.8 mg/mL load via infusion 150 mg (150 mg Intravenous Bolus from Bag 01/18/23 1613)    Followed by  amiodarone  (NEXTERONE  PREMIX) 360-4.14 MG/200ML-% (1.8 mg/mL) IV infusion (has no administration in time range)    Followed by  amiodarone  (NEXTERONE  PREMIX) 360-4.14 MG/200ML-% (1.8 mg/mL) IV infusion (has no administration in time range)  iohexol  (OMNIPAQUE ) 350 MG/ML injection 75 mL (has no administration in time range)  vasopressin  (PITRESSIN) 20 Units in 100 mL (0.2 unit/mL) infusion-*FOR SHOCK* (0.02 Units/min Intravenous New Bag/Given 01/18/23 1550)  sodium chloride  0.9 %  bolus 1,000 mL (has no administration in time range)  docusate sodium  (COLACE) capsule 100 mg (has no administration in time range)  polyethylene glycol (MIRALAX  / GLYCOLAX ) packet 17 g (has no administration in time range)  docusate (COLACE) 50 MG/5ML liquid 100 mg (has no administration in time range)  polyethylene glycol (MIRALAX  / GLYCOLAX ) packet 17 g (has no administration in time range)  insulin  aspart (novoLOG ) injection 0-20 Units (has no administration in time range)  lactated ringers  infusion (has no administration in time range)  pantoprazole  (PROTONIX ) injection 40 mg (has no administration in time range)  methylPREDNISolone  sodium succinate (SOLU-MEDROL ) 40 mg/mL injection 40 mg (has no administration in time range)  ipratropium-albuterol  (DUONEB) 0.5-2.5 (3) MG/3ML nebulizer solution 3 mL (has no administration in time range)  Oral care mouth rinse (has no administration in time range)  Oral care mouth rinse (has no administration in time range)  arformoterol  (BROVANA ) nebulizer solution 15 mcg (has no administration in time range)  revefenacin  (YUPELRI ) nebulizer solution 175 mcg (has no administration in time  range)  budesonide  (PULMICORT ) nebulizer solution 0.5 mg (has no administration in time range)  sodium chloride  0.9 % bolus 1,000 mL (1,000 mLs Intravenous New Bag/Given 01/18/23 1242)  ipratropium-albuterol  (DUONEB) 0.5-2.5 (3) MG/3ML nebulizer solution 3 mL (3 mLs Nebulization Given 01/18/23 1208)  methylPREDNISolone  sodium succinate (SOLU-MEDROL ) 125 mg/2 mL injection 125 mg (125 mg Intravenous Given 01/18/23 1210)  magnesium  sulfate IVPB 2 g 50 mL (0 g Intravenous Stopped 01/18/23 1307)  metoprolol  tartrate (LOPRESSOR ) injection 5 mg (5 mg Intravenous Given 01/18/23 1207)                                                                                                                                     Procedures .Critical Care  Performed by: Elnor Jayson LABOR, DO Authorized by: Elnor Jayson LABOR, DO   Critical care provider statement:    Critical care time (minutes):  100   Critical care time was exclusive of:  Separately billable procedures and treating other patients   Critical care was necessary to treat or prevent imminent or life-threatening deterioration of the following conditions:  Cardiac failure and respiratory failure   Critical care was time spent personally by me on the following activities:  Development of treatment plan with patient or surrogate, discussions with consultants, evaluation of patient's response to treatment, examination of patient, ordering and review of laboratory studies, ordering and review of radiographic studies, ordering and performing treatments and interventions, pulse oximetry, re-evaluation of patient's condition, review of old charts and obtaining history from patient or surrogate   Care discussed with: admitting provider   Procedure Name: Intubation Date/Time: 01/18/2023 2:00 PM  Performed by: Elnor Jayson LABOR, DOPre-anesthesia Checklist: Patient identified, Patient being monitored, Emergency Drugs available, Timeout performed and Suction available Oxygen   Delivery Method: Ambu bag Preoxygenation: Pre-oxygenation with 100% oxygen  Ventilation: Mask ventilation with difficulty Laryngoscope Size: Glidescope and  4 Grade View: Grade II Tube size: 7.5 mm Number of attempts: 1 Placement Confirmation: ETT inserted through vocal cords under direct vision, CO2 detector and Breath sounds checked- equal and bilateral Secured at: 23 cm Tube secured with: ETT holder Dental Injury: Teeth and Oropharynx as per pre-operative assessment  Difficulty Due To: Difficulty was anticipated, Difficult Airway-  due to edematous airway and Difficult Airway- due to limited oral opening Comments: Copious blood to posterior oropharynx, significant edema to tongue.  Copious blood from trachea    CPR  Date/Time: 01/18/2023 4:11 PM  Performed by: Elnor Jayson LABOR, DO Authorized by: Elnor Jayson LABOR, DO  CPR Procedure Details:      Amount of time prior to administration of ACLS/BLS (minutes):  0   ACLS/BLS initiated by EMS: No     CPR/ACLS performed in the ED: Yes     Duration of CPR (minutes):  11   Outcome: ROSC obtained    CPR performed via ACLS guidelines under my direct supervision.  See RN documentation for details including defibrillator use, medications, doses and timing. Comments:     ROSC obtained. Pulse check with PEA prior to ROSC, no shockable rhythm   (including critical care time)  Medical Decision Making / ED Course    Medical Decision Making:    Scott Lindsey is a 68 y.o. male with past medical history as below, significant for COPD, no longer smoking as of 2 weeks ago, alcohol  abuse, symptomatic PVCs, hypertension, squamous cell carcinoma of right lung newly diagnosed who presents to the ED with complaint of exertional dyspnea, fatigue, chest tightness. The complaint involves an extensive differential diagnosis and also carries with it a high risk of complications and morbidity.  Serious etiology was considered. Ddx includes but is not limited to: In  my evaluation of this patient's dyspnea my DDx includes, but is not limited to, pneumonia, pulmonary embolism, pneumothorax, pulmonary edema, metabolic acidosis, asthma, COPD, cardiac cause, anemia, anxiety, etc.  Differential includes all life-threatening causes for chest pain. This includes but is not exclusive to acute coronary syndrome, aortic dissection, pulmonary embolism, cardiac tamponade, community-acquired pneumonia, pericarditis, musculoskeletal chest wall pain, etc.   Complete initial physical exam performed, notably the patient was in no acute distress, A-fib with RVR noted on telemetry, blood pressure stable..    Reviewed and confirmed nursing documentation for past medical history, family history, social history.  Vital signs reviewed.      Clinical Course as of 01/18/23 1613  Fri Jan 18, 2023  1132 CHA2DS2-VASc score 2; HAS-BLED score is 1 [SG]  1134 History of frequent PVCs; was noted to be in atrial fibrillation during recent hospitalization.  No anticoagulation, increased metoprolol  during admission.  Atrial fibrillation noted on telemetry and on EKG.  Will start heparin , start rate control [SG]  1420 Called to bedside, pt with possible seizure activity. Bit his tongue, copious bleeding from the tongue, pt gurgling. Unable to clear secretions, pulses lost and CPR was initiated by myself. ACLS protocol was follows and ROSC obtained at 14:12 [SG]  1423 Afib RVR on telemetry  [SG]  1438 Spoke w/ Dr Kara PCCM, will admit.  [SG]    Clinical Course User Index [SG] Elnor Jayson LABOR, DO    Brief summary: 68 year old male with history as above including newly diagnosed squamous cell carcinoma of right lung, here with elevated heart rate, fatigue, weakness, dyspnea on exertion.  URI type symptoms. Hypoxia PTA by ems to 70's, no home o2. Found A-fib with  RVR on telemetry on arrival.  CHA2DS2-VASc elevated, will give metoprolol  as he is takes this at home.  Responded well to metoprolol ,  heart rate rate controlled at this time.  Delay in labs secondary to hemolyzed.  Plan to start [facility] once imaging back. New lung cancer, will get CTH and CTPE. Called to bedside as patient unresponsive, see ED course above.  ROSC was obtained shortly after intubation. Copious bloody secretions in posterior oropharynx and trachea. Was able to remove part of his dentures but worried other half of dentures not accounted for. May have aspirated.  Discussed with PCCM who will admit patient.  Remains in A-fib with RVR, rate was around 200 at time of ROSC and now is improved to around 130.  Will start amnio.  Hold off anticoagulation until imaging has been completed.  D/w pt's spouse at bedside.  He is critically ill. Discussed severity of illness with his spouse at bedside.   Recommending holding AC until CT head is obtained.  Admitted PCCM Dr Kara            Additional history obtained: -Additional history obtained from family -External records from outside source obtained and reviewed including: Chart review including previous notes, labs, imaging, consultation notes including  Recent mission, home medications, primary care documentation, prior labs and imaging   Lab Tests: -I ordered, reviewed, and interpreted labs.   The pertinent results include:   Labs Reviewed  BASIC METABOLIC PANEL - Abnormal; Notable for the following components:      Result Value   CO2 20 (*)    All other components within normal limits  CBC WITH DIFFERENTIAL/PLATELET - Abnormal; Notable for the following components:   WBC 18.8 (*)    RBC 2.94 (*)    Hemoglobin 9.3 (*)    HCT 28.6 (*)    Neutro Abs 15.6 (*)    Abs Immature Granulocytes 0.45 (*)    All other components within normal limits  CBG MONITORING, ED - Abnormal; Notable for the following components:   Glucose-Capillary 29 (*)    All other components within normal limits  CBG MONITORING, ED - Abnormal; Notable for the following components:    Glucose-Capillary 342 (*)    All other components within normal limits  RESP PANEL BY RT-PCR (RSV, FLU A&B, COVID)  RVPGX2  CULTURE, RESPIRATORY W GRAM STAIN  CBC WITH DIFFERENTIAL/PLATELET  TSH  BRAIN NATRIURETIC PEPTIDE  BLOOD GAS, ARTERIAL  HEMOGLOBIN A1C  HIV ANTIBODY (ROUTINE TESTING W REFLEX)  PROCALCITONIN  BRAIN NATRIURETIC PEPTIDE  CBC  I-STAT CG4 LACTIC ACID, ED  TROPONIN I (HIGH SENSITIVITY)  TROPONIN I (HIGH SENSITIVITY)    Notable for WBC + similar to prior, hgb similar to prior, remainder labs pending.   EKG   EKG Interpretation Date/Time:  Friday January 18 2023 14:13:26 EST Ventricular Rate:  188 PR Interval:    QRS Duration:  61 QT Interval:  266 QTC Calculation: 471 R Axis:   64  Text Interpretation: Atrial fibrillation with rapid V-rate Ventricular premature complex Low voltage, extremity and precordial leads Repolarization abnormality, prob rate related Confirmed by Elnor Savant ([id number]) on 01/18/2023 2:22:58 PM         Imaging Studies ordered: I ordered imaging studies including chest x-ray, CT head, CT PE.;  CT imaging is pending at time of shift change. I independently visualized the following imaging with scope of interpretation limited to determining acute life threatening conditions related to emergency care; findings noted above I independently visualized and  interpreted imaging. I agree with the radiologist interpretation   Medicines ordered and prescription drug management: Meds ordered this encounter  Medications   sodium chloride  0.9 % bolus 1,000 mL   ipratropium-albuterol  (DUONEB) 0.5-2.5 (3) MG/3ML nebulizer solution 3 mL   methylPREDNISolone  sodium succinate (SOLU-MEDROL ) 125 mg/2 mL injection 125 mg   magnesium  sulfate IVPB 2 g 50 mL   metoprolol  tartrate (LOPRESSOR ) injection 5 mg   LORazepam  (ATIVAN ) 2 MG/ML injection    [place], Sherifa: cabinet override   fentaNYL  (SUBLIMAZE ) injection 50 mcg   fentaNYL  (SUBLIMAZE ) injection 50  mcg   propofol  (DIPRIVAN ) 1000 MG/100ML infusion   levETIRAcetam  (KEPPRA ) IVPB 1500 mg/ 100 mL premix   FOLLOWED BY Linked Order Group    amiodarone  (NEXTERONE ) 1.8 mg/mL load via infusion 150 mg    amiodarone  (NEXTERONE  PREMIX) 360-4.14 MG/200ML-% (1.8 mg/mL) IV infusion    amiodarone  (NEXTERONE  PREMIX) 360-4.14 MG/200ML-% (1.8 mg/mL) IV infusion   iohexol  (OMNIPAQUE ) 350 MG/ML injection 75 mL   vasopressin  (PITRESSIN) 20 Units in 100 mL (0.2 unit/mL) infusion-*FOR SHOCK*   sodium chloride  0.9 % bolus 1,000 mL   docusate sodium  (COLACE) capsule 100 mg   polyethylene glycol (MIRALAX  / GLYCOLAX ) packet 17 g   docusate (COLACE) 50 MG/5ML liquid 100 mg   polyethylene glycol (MIRALAX  / GLYCOLAX ) packet 17 g   insulin  aspart (novoLOG ) injection 0-20 Units    Correction coverage::   Resistant (obese, steroids)    CBG < 68::   Implement Hypoglycemia Standing Orders and refer to Hypoglycemia Standing Orders sidebar report    CBG 70 - 120::   0 units    CBG 121 - 150::   3 units    CBG 151 - 200::   4 units    CBG 201 - 250::   7 units    CBG 251 - 300::   11 units    CBG 301 - 350::   15 units    CBG 351 - 400::   20 units    CBG > 400:   call MD and obtain STAT lab verification   lactated ringers  infusion   pantoprazole  (PROTONIX ) injection 40 mg   methylPREDNISolone  sodium succinate (SOLU-MEDROL ) 40 mg/mL injection 40 mg   ipratropium-albuterol  (DUONEB) 0.5-2.5 (3) MG/3ML nebulizer solution 3 mL   Oral care mouth rinse   Oral care mouth rinse   arformoterol  (BROVANA ) nebulizer solution 15 mcg   revefenacin  (YUPELRI ) nebulizer solution 175 mcg   budesonide  (PULMICORT ) nebulizer solution 0.5 mg    -I have reviewed the patients home medicines and have made adjustments as needed   Consultations Obtained: I requested consultation with the CCM,  and discussed lab and imaging findings as well as pertinent plan - they recommend: Admit   Cardiac Monitoring: The patient was maintained on a  cardiac monitor.  I personally viewed and interpreted the cardiac monitored which showed an underlying rhythm of: A-fib with RVR Continuous pulse oximetry interpreted by myself, 94% on 4L   Social Determinants of Health:  Diagnosis or treatment significantly limited by social determinants of health: former smoker and alcohol  use   Reevaluation: After the interventions noted above, I reevaluated the patient and found that they have worsened  Co morbidities that complicate the patient evaluation  Past Medical History:  Diagnosis Date   Back pain       Dispostion: Disposition decision including need for hospitalization was considered, and patient admitted to the hospital.    Final Clinical Impression(s) / ED Diagnoses Final  diagnoses:  Atrial fibrillation with rapid ventricular response (HCC)  Exertional dyspnea  Seizure (HCC)  Acute respiratory failure with hypoxia (HCC)  Endotracheally intubated        Elnor Jayson LABOR, DO 01/18/23 1613

## 2023-01-19 ENCOUNTER — Other Ambulatory Visit: Payer: Self-pay

## 2023-01-19 ENCOUNTER — Inpatient Hospital Stay (HOSPITAL_COMMUNITY): Payer: Medicare Other

## 2023-01-19 DIAGNOSIS — R569 Unspecified convulsions: Secondary | ICD-10-CM | POA: Diagnosis not present

## 2023-01-19 DIAGNOSIS — J9601 Acute respiratory failure with hypoxia: Secondary | ICD-10-CM | POA: Diagnosis not present

## 2023-01-19 LAB — BLOOD GAS, ARTERIAL
Acid-base deficit: 0.2 mmol/L (ref 0.0–2.0)
Bicarbonate: 22.1 mmol/L (ref 20.0–28.0)
Drawn by: 22563
FIO2: 50 %
MECHVT: 0.47 mL
O2 Saturation: 100 %
PEEP: 5 cmH2O
Patient temperature: 35.9
RATE: 24 {breaths}/min
pCO2 arterial: 28 mm[Hg] — ABNORMAL LOW (ref 32–48)
pH, Arterial: 7.51 — ABNORMAL HIGH (ref 7.35–7.45)
pO2, Arterial: 163 mm[Hg] — ABNORMAL HIGH (ref 83–108)

## 2023-01-19 LAB — CBC
HCT: 26.3 % — ABNORMAL LOW (ref 39.0–52.0)
Hemoglobin: 8.6 g/dL — ABNORMAL LOW (ref 13.0–17.0)
MCH: 31 pg (ref 26.0–34.0)
MCHC: 32.7 g/dL (ref 30.0–36.0)
MCV: 94.9 fL (ref 80.0–100.0)
Platelets: 158 10*3/uL (ref 150–400)
RBC: 2.77 MIL/uL — ABNORMAL LOW (ref 4.22–5.81)
RDW: 15.4 % (ref 11.5–15.5)
WBC: 16.4 10*3/uL — ABNORMAL HIGH (ref 4.0–10.5)
nRBC: 0 % (ref 0.0–0.2)

## 2023-01-19 LAB — BASIC METABOLIC PANEL
Anion gap: 8 (ref 5–15)
BUN: 20 mg/dL (ref 8–23)
CO2: 21 mmol/L — ABNORMAL LOW (ref 22–32)
Calcium: 9.4 mg/dL (ref 8.9–10.3)
Chloride: 105 mmol/L (ref 98–111)
Creatinine, Ser: 0.82 mg/dL (ref 0.61–1.24)
GFR, Estimated: >60 mL/min (ref >60)
Glucose, Bld: 162 mg/dL — ABNORMAL HIGH (ref 70–99)
Potassium: 3.6 mmol/L (ref 3.5–5.1)
Sodium: 134 mmol/L — ABNORMAL LOW (ref 135–145)

## 2023-01-19 LAB — GLUCOSE, CAPILLARY
Glucose-Capillary: 117 mg/dL — ABNORMAL HIGH (ref 70–99)
Glucose-Capillary: 101 mg/dL — ABNORMAL HIGH (ref 70–99)
Glucose-Capillary: 82 mg/dL (ref 70–99)
Glucose-Capillary: 101 mg/dL — ABNORMAL HIGH (ref 70–99)
Glucose-Capillary: 117 mg/dL — ABNORMAL HIGH (ref 70–99)

## 2023-01-19 LAB — MAGNESIUM: Magnesium: 1.7 mg/dL (ref 1.7–2.4)

## 2023-01-19 LAB — PROCALCITONIN: Procalcitonin: 2.71 ng/mL

## 2023-01-19 LAB — TRIGLYCERIDES: Triglycerides: 61 mg/dL (ref <150)

## 2023-01-19 LAB — HIV ANTIBODY (ROUTINE TESTING W REFLEX): HIV Screen 4th Generation wRfx: NONREACTIVE

## 2023-01-19 LAB — HEMOGLOBIN A1C
Hgb A1c MFr Bld: 6.3 % — ABNORMAL HIGH (ref 4.8–5.6)
Mean Plasma Glucose: 134.11 mg/dL

## 2023-01-19 LAB — HEPARIN LEVEL (UNFRACTIONATED)
Heparin Unfractionated: 0.6 [IU]/mL (ref 0.30–0.70)
Heparin Unfractionated: 0.73 [IU]/mL — ABNORMAL HIGH (ref 0.30–0.70)

## 2023-01-19 LAB — PHOSPHORUS: Phosphorus: 4 mg/dL (ref 2.5–4.6)

## 2023-01-19 MED ORDER — LACTATED RINGERS IV BOLUS
1000.0000 mL | Freq: Once | INTRAVENOUS | Status: AC
  Administered 2023-01-19: 1000 mL via INTRAVENOUS

## 2023-01-19 MED ORDER — SODIUM CHLORIDE 0.9% FLUSH: 10.0000 mL | INTRAVENOUS | Status: DC | PRN

## 2023-01-19 MED ORDER — LACTATED RINGERS IV SOLN: INTRAVENOUS | Status: AC

## 2023-01-19 MED ORDER — SODIUM CHLORIDE 0.9% FLUSH
10.0000 mL | Freq: Two times a day (BID) | INTRAVENOUS | Status: DC
  Administered 2023-01-19 – 2023-01-21 (×4): 10 mL
  Administered 2023-01-22: 40 mL
  Administered 2023-01-22 – 2023-01-23 (×2): 20 mL
  Administered 2023-01-23 – 2023-01-24 (×2): 10 mL
  Administered 2023-01-24: 20 mL
  Administered 2023-01-25: 10 mL
  Administered 2023-01-25: 30 mL
  Administered 2023-01-26: 10 mL

## 2023-01-19 NOTE — Progress Notes (Signed)
 Alerted by RN that PICC doesn't pull back well. She reports blood return from both lumen but only able to withdraw 2mL waste. Flushed line, was able to draw labs. Noted blood return slows when pulled rapidly.  May be related to increased pressure in chest. If PICC issues persist, please consider tunneled femoral line.

## 2023-01-19 NOTE — Progress Notes (Addendum)
 eLink Physician-Brief Progress Note Patient Name: Scott Lindsey DOB: [birth year] MRN: [id number]   Date of Service  01/19/2023  HPI/Events of Note    eICU Interventions      Hypotension Responded earlier to IVF  Will give bolus and maintenance   Addendum BP remains low 30 ml per hour UOP Receiving ativan  for myoclonic jerks And propofol  for sedation  Will continue IVF maintenance and start Levophed      Gerrit Ahumada 01/19/2023, 10:46 PM

## 2023-01-19 NOTE — Plan of Care (Signed)
  Problem: Respiratory: Goal: Ability to maintain a clear airway and adequate ventilation will improve Outcome: Not Progressing   Problem: Education: Goal: Knowledge of General Education information will improve Description: Including pain rating scale, medication(s)/side effects and non-pharmacologic comfort measures Outcome: Not Progressing   Problem: Clinical Measurements: Goal: Ability to maintain clinical measurements within normal limits will improve Outcome: Not Progressing Goal: Diagnostic test results will improve Outcome: Not Progressing Goal: Respiratory complications will improve Outcome: Not Progressing Goal: Cardiovascular complication will be avoided Outcome: Not Progressing

## 2023-01-19 NOTE — Progress Notes (Signed)
 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  Has yet to start therapy. Presented to the [facility] emergency room on 1/10 with chief complaint worsening weakness, fatigue, and gasping for air this morning not able to tolerate really any activity.  EMS was called he was found to be in new A-fib/RVR with heart rate in the 120s to 190s, he was hypoxic and found to have pulse oximetry in the 70s with marked increased work of breathing. While in the ER he remained hypoxic, while staff was working on support patient had sudden episode of what appeared to be seizure, he bit his tongue, shortly after this suffered a PEA cardiac arrest.  Time to return of spontaneous circulation estimated at 11 minutes with ongoing ACLS.  On critical care arrival the patient was hypotensive following 1 L of crystalloid, still continued to demonstrate agonal respiratory efforts on the ventilator.  There were bloody endotracheal tube secretions, patient on propofol , withdrawing to pain in all extremities ER evaluation at time of admission HCO3 20 COVID-negative normal renal function, initial chest x-ray with previously noted right lung base opacity and bibasilar airspace disease Pertinent  Medical History  COPD, pulmonary fibrosis, prior alcoholic pancreatitis, history of remote alcohol  abuse, history of prostate cancer, hypertension, HLD, newly diagnosed metastatic squamous cell carcinoma felt lung primary site with metastasis to liver and lymph  nodes  Significant Hospital Events: Including procedures, antibiotic start and stop dates in addition to other pertinent events   1/10 just discharged on the eighth following admission for pneumonia, COPD exacerbation, and new diagnosis of squamous cell carcinoma, felt pulmonary as primary with metastasis to lymph nodes and liver presents acutely hypoxic with pulse oximetry in the 70s, marked work of breathing gasping for air, and new atrial fibrillation with RVR shortly after presentation to the ER suffered PEA cardiac arrest times ROSC estimated at 11 minutes.  Noted to be somewhat difficult intubation due to blood in airway, patient having bit tongue.  Also ER staff reporting concern about possible aspiration of dentures  Interim History / Subjective:  Labs unable to be obtained this morning 0.50, PEEP 5, 470 x 24 > respiratory alkalosis on ABG 1/11 Amiodarone  Propofol  I/O +3 L total   Objective   Blood pressure 108/69, pulse 75, temperature (!) 96.6 F (35.9 C), resp. rate (!) 5, height 5' 8 (1.727 m), weight 66.7 kg, SpO2 100%.    Vent Mode: PRVC FiO2 (%):  [40 %-100 %] 50 % Set Rate:  [16 bmp-24 bmp] 24 bmp Vt Set:  [470 mL] 470 mL PEEP:  [5 cmH20] 5 cmH20 Plateau Pressure:  [12 cmH20-23 cmH20] 23 cmH20   Intake/Output Summary (Last 24 hours) at 01/19/2023 0829 Last data filed at 01/19/2023 0524 Gross per 24 hour  Intake 3401.39 ml  Output 350 ml  Net 3051.39 ml   Filed Weights   01/18/23 1522 01/18/23 1733 01/19/23 0500  Weight: 60.8 kg 60.6 kg 66.7 kg    Examination: General: Critically ill-appearing man, laying in bed mechanically ventilated HENT: ET tube  in position.  Some scant secretions Lungs: Scattered bilateral rhonchi without wheezes Cardiac regular, 70s, no murmur.  Back in NSR Abdomen nondistended with positive bowel sounds Extremities no significant edema Neuro: Sedated, does not open eyes to voice.  He has some jerking of his jaw intermittently  concerning for myoclonus.  Withdraws to pain bilateral upper and lower extremities.  Does not follow commands.  No purposeful movement noted    Resolved Hospital Problem list     Assessment & Plan:  Acute hypoxic respiratory failure with postobstructive physiology from his newly diagnosed lung cancer.  Consider recurrent obstructive pneumonia known h/o COPD and newly diagnosed metastatic squamous cell carcinoma (presumed lung primary w/ mets to liver and LNs) Presumed contribution of new onset atrial fibrillation to his decompensation, respiratory failure -No evidence of PE on CT chest Plan -Respiratory alkalosis on 1/11, decreased minute ventilation -PRVC 8 cc/kg -Scheduled bronchodilators: Brovana , Neupro ready -Pulmicort  nebs -Scheduled Solu-Medrol  with plan for taper for component bronchospasm, COPD exacerbation -Empiric antibiotics: Vancomycin , cefepime  -Atrial fibrillation treatment as below  S/p PEA cardiac arrest.  No evidence of pulmonary embolism on CT, question primary cause atrial fibrillation with hemodynamic collapse.  Consider also contribution of seizures Plan -Echocardiogram ordered and pending -Hemodynamic support, not currently requiring pressors -Seizure evaluation -Treat underlying conditions, contributors including metabolic disarray  Circulatory shock status postcardiac arrest.  Suspect at least somewhat due to sedating medications and relative hypovolemia given his response to volume resuscitation, but I worry about obstructive shock with cardiogenic component.  Atrial fibrillation also likely a contributor Plan -Home antihypertensive regimen on hold (clonidine , Cozaar , metoprolol ) -IV fluid resuscitation -Follow lactic acid for clearance -Echocardiogram ordered and pending -Pressors weaned to off  A-fib with RVR.  Suspect this is secondary to his acute illness Plan -Amiodarone  as ordered -Telemetry monitoring -Echocardiogram as above -Heparin   initiated 1/10 after head CT was reassuring  Acute metabolic encephalopathy Seizures, question primary versus due to acute cardiopulmonary arrest Plan -Head CT reassuring -EEG, results pending -Heparin  ordered after head CT was reassuring  Possible swallowing versus aspiration of partial denture Plan No foreign body noted on CT chest  Follow-up CT imaging, currently nothing on plain imaging  Anemia with bloody oral secretions after biting tongue Current hemoglobin not reflecting bloody oral secretions as was obtained prior to seizure Plan -Head of bed elevated, suction as needed -Follow CBC especially on heparin   Steroid-induced hyperglycemia Plan -Sliding scale insulin  as ordered  Best Practice (right click and Reselect all SmartList Selections daily)   Diet/type: NPO DVT prophylaxis other Pressure ulcer(s): N/A GI prophylaxis: PPI Lines: N/A Foley:  Yes, and it is still needed Code Status:  full code Last date of multidisciplinary goals of care discussion [pending]  Labs   CBC: Recent Labs  Lab 01/13/23 0456 01/14/23 0421 01/15/23 0407 01/18/23 1509 01/18/23 1754  WBC 12.9* 11.7* 11.1* 18.8* 17.0*  NEUTROABS 8.6* 7.7  --  15.6*  --   HGB 10.0* 9.6* 10.1* 9.3* 8.6*  HCT 30.7* 30.9* 32.2* 28.6* 26.4*  MCV 96.2 98.7 98.8 97.3 96.0  PLT 175 182 187 151 132*    Basic Metabolic Panel: Recent Labs  Lab 01/13/23 0456 01/14/23 0421 01/18/23 1130  NA 134* 135 135  K 3.9 3.9 4.7  CL 103 102 103  CO2 24 23 20*  GLUCOSE 113* 96 82  BUN 18 17 14   CREATININE 0.76 0.77 0.99  CALCIUM  9.8 9.8 10.2  MG 1.7 1.8  --    GFR: Estimated Creatinine  Clearance: 66.4 mL/min (by C-G formula based on SCr of 0.99 mg/dL). Recent Labs  Lab 01/14/23 0421 01/15/23 0407 01/18/23 1509 01/18/23 1754  PROCALCITON  --   --   --  0.56  WBC 11.7* 11.1* 18.8* 17.0*  LATICACIDVEN  --   --   --  3.8*    Liver Function Tests: No results for input(s): AST, ALT, ALKPHOS,  BILITOT, PROT, ALBUMIN in the last 168 hours. No results for input(s): LIPASE, AMYLASE in the last 168 hours. No results for input(s): AMMONIA in the last 168 hours.  ABG    Component Value Date/Time   PHART 7.51 (H) 01/19/2023 0430   PCO2ART 28 (L) 01/19/2023 0430   PO2ART 163 (H) 01/19/2023 0430   HCO3 22.1 01/19/2023 0430   ACIDBASEDEF 0.2 01/19/2023 0430   O2SAT 100 01/19/2023 0430     Coagulation Profile: No results for input(s): INR, PROTIME in the last 168 hours.  Cardiac Enzymes: No results for input(s): CKTOTAL, CKMB, CKMBINDEX, TROPONINI in the last 168 hours.  HbA1C: Hgb A1c MFr Bld  Date/Time Value Ref Range Status  01/18/2023 05:54 PM 6.3 (H) 4.8 - 5.6 % Final    Comment:    (NOTE) Pre diabetes:          5.7%-6.4%  Diabetes:              >6.4%  Glycemic control for   <7.0% adults with diabetes     CBG: Recent Labs  Lab 01/18/23 1731 01/18/23 1939 01/18/23 2335 01/19/23 0354 01/19/23 0822  GLUCAP 187* 196* 120* 117* 101*     Critical care time: 34 min      Lamar Chris, MD, PhD 01/19/2023, 8:29 AM [facility] [phone] or if no answer before 7:00PM call [phone] For any issues after 7:00PM please call eLink [phone]

## 2023-01-19 NOTE — Progress Notes (Signed)
 PICC order received. Order discussed with Dr. Delton Coombes: recommended femoral line due to mediastinal lymphadenopathy noted on CXR. Per Dr. Delton Coombes: CT reviewed, OK to proceed with PICC, use R arm.

## 2023-01-19 NOTE — Progress Notes (Signed)
 PHARMACY - ANTICOAGULATION CONSULT NOTE  Pharmacy Consult for Heparin  Indication: atrial fibrillation  No Known Allergies  Patient Measurements: Height: 5' 8 (172.7 cm) Weight: 66.7 kg (147 lb 0.8 oz) IBW/kg (Calculated) : 68.4 Heparin  Dosing Weight: TBW  Vital Signs: Temp: 96.4 F (35.8 C) (01/11 1415) Temp Source: Core (01/11 1200) BP: 83/51 (01/11 1415) Pulse Rate: 75 (01/11 1415)  Labs: Recent Labs    01/18/23 1130 01/18/23 1509 01/18/23 1509 01/18/23 1754 01/19/23 1149  HGB  --  9.3*   < > 8.6* 8.6*  HCT  --  28.6*  --  26.4* 26.3*  PLT  --  151  --  132* 158  HEPARINUNFRC  --   --   --   --  0.60  CREATININE 0.99  --   --   --  0.82  TROPONINIHS  --  124*  --  149*  --    < > = values in this interval not displayed.    Estimated Creatinine Clearance: 80.2 mL/min (by C-G formula based on SCr of 0.82 mg/dL).    Assessment: 29 yoM presented to ED on 1/10 with fatigue, chest tightness, and SOB. Noted to be in Afib here and during a recent admission. Pharmacy consulted to start IV heparin  for Afib. PMH significant for COPD, EtOH use, HTN, symptomatic PVCs, new Dx SCC of R lung.  No prior anticoagulation except Lovenox  40mg  on 1/8.    Significant events: 1/10 afternoon: still awaiting CBC results prior to starting heparin . At 14:20, patient found possibly seizing; bit tongue which led to copious bleeding. Patient aspirated on blood and subsequently suffered cardiac arrest, requiring a brief course of CPR prior to ROSC. Intubated and admitted to ICU. Remains in Afib CBC: Hgb low/decreased to 8.6, but relatively consistent with recent baseline 9-10s.  Plt down to 132.  CTa negative for PE, CT head negative for bleeding.   Per Jeralyn Banner, NP, OK to start heparin  on 1/10 at 19:00 after CTs and labs were completed and reviewed.   01/19/2023 First heparin  level is therapeutic at 0.6 Level drawn 15 hours after heparin  1500 unit bolus & drip started at 900  units/hr Heparin  level draw was delayed to due difficulty drawing blood.  PICC line placed for blood draw. RN reports some red secretions from OG tube> unsure if old or new blood.  (Pt bit his tongue during code blue intubation or during seizure like event) CBC stable. Hg remains 8.6 & PLT stable at 158. SCr stable at 0.82   Goal of Therapy:  Heparin  level 0.3-0.7 units/ml Monitor platelets by anticoagulation protocol: Yes   Plan:  continue heparin  IV infusion at 900 units/hr Draw confirmatory heparin  level @ 1800 Daily heparin  level and CBC Monitor for bleeding or complications.     Rosaline IVAR Edison, Pharm.D Use secure chat for questions 01/19/2023 2:43 PM

## 2023-01-19 NOTE — Plan of Care (Signed)
  Problem: Activity: Goal: Ability to tolerate increased activity will improve Outcome: Not Progressing   Problem: Respiratory: Goal: Ability to maintain a clear airway and adequate ventilation will improve Outcome: Not Progressing   

## 2023-01-19 NOTE — Progress Notes (Signed)
 RT note: ABG obtained/labelled/sent to Lab/notified.

## 2023-01-19 NOTE — Progress Notes (Signed)
 PHARMACY - ANTICOAGULATION CONSULT NOTE  Pharmacy Consult for Heparin  Indication: atrial fibrillation  No Known Allergies  Patient Measurements: Height: 5' 8 (172.7 cm) Weight: 66.7 kg (147 lb 0.8 oz) IBW/kg (Calculated) : 68.4 Heparin  Dosing Weight: TBW  Vital Signs: Temp: 95.9 F (35.5 C) (01/11 2018) Temp Source: Core (01/11 1200) BP: 111/63 (01/11 2018) Pulse Rate: 63 (01/11 2018)  Labs: Recent Labs    01/18/23 1130 01/18/23 1509 01/18/23 1509 01/18/23 1754 01/19/23 1149 01/19/23 2011  HGB  --  9.3*   < > 8.6* 8.6*  --   HCT  --  28.6*  --  26.4* 26.3*  --   PLT  --  151  --  132* 158  --   HEPARINUNFRC  --   --   --   --  0.60 0.73*  CREATININE 0.99  --   --   --  0.82  --   TROPONINIHS  --  124*  --  149*  --   --    < > = values in this interval not displayed.    Estimated Creatinine Clearance: 80.2 mL/min (by C-G formula based on SCr of 0.82 mg/dL).    Assessment: 77 yoM presented to ED on 1/10 with fatigue, chest tightness, and SOB. Noted to be in Afib here and during a recent admission. Pharmacy consulted to start IV heparin  for Afib. PMH significant for COPD, EtOH use, HTN, symptomatic PVCs, new Dx SCC of R lung.  No prior anticoagulation except Lovenox  40mg  on 1/8.    Significant events: 1/10 afternoon: still awaiting CBC results prior to starting heparin . At 14:20, patient found possibly seizing; bit tongue which led to copious bleeding. Patient aspirated on blood and subsequently suffered cardiac arrest, requiring a brief course of CPR prior to ROSC. Intubated and admitted to ICU. Remains in Afib CBC: Hgb low/decreased to 8.6, but relatively consistent with recent baseline 9-10s.  Plt down to 132.  CTa negative for PE, CT head negative for bleeding.   Per Jeralyn Banner, NP, OK to start heparin  on 1/10 at 19:00 after CTs and labs were completed and reviewed.    Today, 01/19/23: First heparin  level is therapeutic at 0.6 units/mL Level drawn 15 hours  after heparin  1500 unit bolus & drip started at 900 units/hr Heparin  level draw was delayed to due difficulty drawing blood.  PICC line placed for blood draw. RN reports some red secretions from OG tube > unsure if old or new blood.  (Pt bit his tongue during code blue intubation or during seizure like event) CBC stable. Hgb remains 8.6 & Plt improved to WNL at 158. SCr stable at 0.82  PM heparin  level = 0.73 units/mL, slightly supratherapeutic RN reports heparin  infusing in L peripheral line and heparin  level collected from R PICC Per RN, patient still having some red secretions from OG tube and a small volume of blood in mouth, but no worse than previous. No other bleeding noted by RN.     Goal of Therapy:  Heparin  level 0.3-0.7 units/ml Monitor platelets by anticoagulation protocol: Yes   Plan:  Decrease IV heparin  infusion to 800 units/hr Heparin  level 6 hours after rate change Daily heparin  level and CBC Monitor for bleeding or any other complications of therapy      Isami Mehra, PharmD, BCPS Clinical Pharmacist 01/19/2023 8:52 PM

## 2023-01-19 NOTE — Progress Notes (Addendum)
 RT Note: Patient appears to have a slight jerky movement that looks like hiccups but not sure of the reason. Patient is stable,maintaining saturations at 99%, and appears to be tolerating the ventilator well at this time. Ambu bag at [facility]. Patient suctioned and lavadged with saline. Secretions thick  and bloody

## 2023-01-19 NOTE — Procedures (Addendum)
 Patient Name: Scott Lindsey  MRN: [id number]  Epilepsy Attending: Arlin MALVA Krebs  Referring Physician/Provider: Jenna Maude BRAVO, NP  Duration: 01/18/2023 1853 to 01/19/2023 1230  Patient history: 67yo m s/p cardiac arrest getting eeg to evaluate for seizure  Level of alertness:  comatose  AEDs during EEG study: Propofol   Technical aspects: This EEG was obtained using a 10 lead EEG system positioned circumferentially without any parasagittal coverage (rapid EEG). Computer selected EEG is reviewed as  well as background features and all clinically significant events.  Description: EEG initially showed continuous generalized low amplitude 3-6Hz  theta- delta slowing. Generalized spikes were noted every 15 seconds to 1 minute which gradually worsened to 1-5 seconds.  Hyperventilation and photic stimulation were not performed.     ABNORMALITY - Spikes, generalized - Continuous slow, generalized  IMPRESSION: This limited ceribell EEG was suggestive of epileptogenicity with generalized onset as well as severe diffuse encephalopathy. No seizures were seen throughout the recording.  If suspicion for ictal activity remains a concern, traditional eeg can be considered.  Lance Galas O Kawana Hegel

## 2023-01-19 NOTE — IPAL (Signed)
  Interdisciplinary Goals of Care Family Meeting   Date carried out:: 01/19/2023  Location of the meeting: Phone conference  Member's involved: Physician and Family Member or next of kin  Durable Power of Attorney or acting medical decision maker: Patient's wife, Lanorris Kalisz    Discussion: We discussed goals of care for Scott Lindsey .  I spoke with Mrs. Hoskinson by phone and reviewed the patient's status, his organ system injuries.  In particular we talked about his neurological status and evidence for myoclonus, significant brain injury in the setting of cardiac arrest.  I have indicated to her that I am concerned that he will not survive this illness.  She understands that we may need to make some decisions regarding withdrawal of care going forward.  For now we both agree that he would not benefit from CPR if he decompensates or becomes unstable.  I will change his CODE STATUS to reflect his wishes.   Code status: Full DNR  Disposition: Continue current acute care   Time spent for the meeting: 20 min  Lamar GORMAN Chris 01/19/2023, 2:47 PM

## 2023-01-20 ENCOUNTER — Inpatient Hospital Stay (HOSPITAL_COMMUNITY): Payer: Medicare Other

## 2023-01-20 DIAGNOSIS — J9601 Acute respiratory failure with hypoxia: Secondary | ICD-10-CM | POA: Diagnosis not present

## 2023-01-20 LAB — GLUCOSE, CAPILLARY
Glucose-Capillary: 115 mg/dL — ABNORMAL HIGH (ref 70–99)
Glucose-Capillary: 120 mg/dL — ABNORMAL HIGH (ref 70–99)
Glucose-Capillary: 121 mg/dL — ABNORMAL HIGH (ref 70–99)
Glucose-Capillary: 128 mg/dL — ABNORMAL HIGH (ref 70–99)
Glucose-Capillary: 132 mg/dL — ABNORMAL HIGH (ref 70–99)
Glucose-Capillary: 132 mg/dL — ABNORMAL HIGH (ref 70–99)
Glucose-Capillary: 144 mg/dL — ABNORMAL HIGH (ref 70–99)
Glucose-Capillary: 164 mg/dL — ABNORMAL HIGH (ref 70–99)
Glucose-Capillary: 244 mg/dL — ABNORMAL HIGH (ref 70–99)
Glucose-Capillary: 49 mg/dL — ABNORMAL LOW (ref 70–99)
Glucose-Capillary: 51 mg/dL — ABNORMAL LOW (ref 70–99)
Glucose-Capillary: 80 mg/dL (ref 70–99)

## 2023-01-20 LAB — CBC
HCT: 23 % — ABNORMAL LOW (ref 39.0–52.0)
Hemoglobin: 7.3 g/dL — ABNORMAL LOW (ref 13.0–17.0)
MCH: 31.1 pg (ref 26.0–34.0)
MCHC: 31.7 g/dL (ref 30.0–36.0)
MCV: 97.9 fL (ref 80.0–100.0)
Platelets: 158 10*3/uL (ref 150–400)
RBC: 2.35 MIL/uL — ABNORMAL LOW (ref 4.22–5.81)
RDW: 15.7 % — ABNORMAL HIGH (ref 11.5–15.5)
WBC: 18.9 10*3/uL — ABNORMAL HIGH (ref 4.0–10.5)
nRBC: 0 % (ref 0.0–0.2)

## 2023-01-20 LAB — BLOOD GAS, ARTERIAL
Acid-base deficit: 3.4 mmol/L — ABNORMAL HIGH (ref 0.0–2.0)
Bicarbonate: 20.1 mmol/L (ref 20.0–28.0)
Drawn by: 59133
FIO2: 35 %
MECHVT: 470 mL
O2 Saturation: 99.7 %
PEEP: 5 cmH2O
Patient temperature: 36.2
RATE: 16 {breaths}/min
pCO2 arterial: 30 mm[Hg] — ABNORMAL LOW (ref 32–48)
pH, Arterial: 7.43 (ref 7.35–7.45)
pO2, Arterial: 102 mm[Hg] (ref 83–108)

## 2023-01-20 LAB — COMPREHENSIVE METABOLIC PANEL
ALT: 92 U/L — ABNORMAL HIGH (ref 0–44)
AST: 47 U/L — ABNORMAL HIGH (ref 15–41)
Albumin: 2.2 g/dL — ABNORMAL LOW (ref 3.5–5.0)
Alkaline Phosphatase: 57 U/L (ref 38–126)
Anion gap: 8 (ref 5–15)
BUN: 24 mg/dL — ABNORMAL HIGH (ref 8–23)
CO2: 21 mmol/L — ABNORMAL LOW (ref 22–32)
Calcium: 9 mg/dL (ref 8.9–10.3)
Chloride: 107 mmol/L (ref 98–111)
Creatinine, Ser: 0.78 mg/dL (ref 0.61–1.24)
GFR, Estimated: >60 mL/min (ref >60)
Glucose, Bld: 209 mg/dL — ABNORMAL HIGH (ref 70–99)
Potassium: 3.7 mmol/L (ref 3.5–5.1)
Sodium: 136 mmol/L (ref 135–145)
Total Bilirubin: 0.6 mg/dL (ref 0.0–1.2)
Total Protein: 5.8 g/dL — ABNORMAL LOW (ref 6.5–8.1)

## 2023-01-20 LAB — HEPARIN LEVEL (UNFRACTIONATED)
Heparin Unfractionated: 0.64 [IU]/mL (ref 0.30–0.70)
Heparin Unfractionated: 0.57 [IU]/mL (ref 0.30–0.70)

## 2023-01-20 LAB — PROCALCITONIN: Procalcitonin: 2.21 ng/mL

## 2023-01-20 MED ORDER — DEXTROSE 50 % IV SOLN
INTRAVENOUS | Status: AC
  Administered 2023-01-20: 25 g via INTRAVENOUS
  Filled 2023-01-20: qty 50

## 2023-01-20 MED ORDER — NOREPINEPHRINE 4 MG/250ML-% IV SOLN
0.0000 ug/min | INTRAVENOUS | Status: DC
  Administered 2023-01-20: 2 ug/min via INTRAVENOUS
  Administered 2023-01-21: 3 ug/min via INTRAVENOUS
  Administered 2023-01-23: 2 ug/min via INTRAVENOUS
  Administered 2023-01-24 – 2023-01-25 (×2): 5 ug/min via INTRAVENOUS
  Administered 2023-01-25 – 2023-01-26 (×2): 3 ug/min via INTRAVENOUS
  Filled 2023-01-20 (×8): qty 250

## 2023-01-20 MED ORDER — DEXTROSE 50 % IV SOLN: 25.0000 g | Freq: Once | INTRAVENOUS | Status: AC

## 2023-01-20 MED ORDER — GADOBUTROL 1 MMOL/ML IV SOLN
7.0000 mL | Freq: Once | INTRAVENOUS | Status: AC | PRN
  Administered 2023-01-20: 7 mL via INTRAVENOUS

## 2023-01-20 MED ORDER — VECURONIUM BROMIDE 10 MG IV SOLR
10.0000 mg | Freq: Once | INTRAVENOUS | Status: AC
  Administered 2023-01-20: 10 mg via INTRAVENOUS
  Filled 2023-01-20: qty 10

## 2023-01-20 MED ORDER — LEVETIRACETAM IN NACL 500 MG/100ML IV SOLN
500.0000 mg | Freq: Two times a day (BID) | INTRAVENOUS | Status: DC
  Administered 2023-01-20 – 2023-01-26 (×13): 500 mg via INTRAVENOUS
  Filled 2023-01-20 (×14): qty 100

## 2023-01-20 NOTE — Progress Notes (Signed)
 ABG collected and send down to lab for analysis. Lab notified.

## 2023-01-20 NOTE — Progress Notes (Signed)
 PHARMACY - ANTICOAGULATION CONSULT NOTE  Pharmacy Consult for Heparin  Indication: atrial fibrillation  No Known Allergies  Patient Measurements: Height: 5' 8 (172.7 cm) Weight: 67.6 kg (149 lb 0.5 oz) IBW/kg (Calculated) : 68.4 Heparin  Dosing Weight: TBW  Vital Signs: Temp: 96.8 F (36 C) (01/12 0830) BP: 92/57 (01/12 0830) Pulse Rate: 92 (01/12 0830)  Labs: Recent Labs    01/18/23 1130 01/18/23 1509 01/18/23 1509 01/18/23 1754 01/18/23 1754 01/19/23 1149 01/19/23 2011 01/20/23 0354 01/20/23 0931  HGB  --  9.3*   < > 8.6*  --  8.6*  --  7.3*  --   HCT  --  28.6*   < > 26.4*  --  26.3*  --  23.0*  --   PLT  --  151   < > 132*  --  158  --  158  --   HEPARINUNFRC  --   --   --   --    < > 0.60 0.73* 0.64 0.57  CREATININE 0.99  --   --   --   --  0.82  --  0.78  --   TROPONINIHS  --  124*  --  149*  --   --   --   --   --    < > = values in this interval not displayed.    Estimated Creatinine Clearance: 83.3 mL/min (by C-G formula based on SCr of 0.78 mg/dL).    Assessment: 24 yoM presented to ED on 1/10 with fatigue, chest tightness, and SOB. Noted to be in Afib here and during a recent admission. Pharmacy consulted to start IV heparin  for Afib. PMH significant for COPD, EtOH use, HTN, symptomatic PVCs, new Dx SCC of R lung.  No prior anticoagulation except Lovenox  40mg  on 1/8.    Significant events: 1/10 afternoon: still awaiting CBC results prior to starting heparin . At 14:20, patient found possibly seizing; bit tongue which led to copious bleeding. Patient aspirated on blood and subsequently suffered cardiac arrest, requiring a brief course of CPR prior to ROSC. Intubated and admitted to ICU. Remains in Afib CBC: Hgb low/decreased to 8.6, but relatively consistent with recent baseline 9-10s.  Plt down to 132.  CTa negative for PE, CT head negative for bleeding.   Per Jeralyn Banner, NP, OK to start heparin  on 1/10 at 19:00 after CTs and labs were completed and  reviewed.    Today, 01/20/23: HL drawn at 0931 = 0.57 - remains therapeutic on 750 units/hr Hgb down to 7.3, plts 158 Night shift RN reported continued bloody secretions from OG tube and some bloody mucus coming from rt nare (bit his tongue during seizure on 1/10)  .    Goal of Therapy:  Heparin  level 0.3-0.7 units/ml Monitor platelets by anticoagulation protocol: Yes   Plan:  continue heparin  drip @ 750 units/hr Daily heparin  level and CBC Monitor for bleeding or any other complications of therapy     Rosaline IVAR Edison, Pharm.D Use secure chat for questions 01/20/2023 11:01 AM

## 2023-01-20 NOTE — Plan of Care (Signed)
  Problem: Skin Integrity: Goal: Risk for impaired skin integrity will decrease Outcome: Not Progressing   Problem: Tissue Perfusion: Goal: Adequacy of tissue perfusion will improve Outcome: Not Progressing   Problem: Activity: Goal: Ability to tolerate increased activity will improve Outcome: Not Progressing   Problem: Respiratory: Goal: Ability to maintain a clear airway and adequate ventilation will improve Outcome: Not Progressing

## 2023-01-20 NOTE — Plan of Care (Signed)
  Problem: Skin Integrity: Goal: Risk for impaired skin integrity will decrease Outcome: Progressing   Problem: Tissue Perfusion: Goal: Adequacy of tissue perfusion will improve Outcome: Progressing   Problem: Respiratory: Goal: Ability to maintain a clear airway and adequate ventilation will improve Outcome: Progressing   Problem: Clinical Measurements: Goal: Ability to maintain clinical measurements within normal limits will improve Outcome: Progressing Goal: Will remain free from infection Outcome: Progressing Goal: Diagnostic test results will improve Outcome: Progressing Goal: Respiratory complications will improve Outcome: Progressing Goal: Cardiovascular complication will be avoided Outcome: Progressing   Problem: Elimination: Goal: Will not experience complications related to bowel motility Outcome: Progressing Goal: Will not experience complications related to urinary retention Outcome: Progressing   Problem: Pain Management: Goal: General experience of comfort will improve Outcome: Progressing   Problem: Safety: Goal: Ability to remain free from injury will improve Outcome: Progressing   Problem: Skin Integrity: Goal: Risk for impaired skin integrity will decrease Outcome: Progressing   Problem: Education: Goal: Ability to describe self-care measures that may prevent or decrease complications (Diabetes Survival Skills Education) will improve Outcome: Not Progressing   Problem: Coping: Goal: Ability to adjust to condition or change in health will improve Outcome: Not Progressing   Problem: Fluid Volume: Goal: Ability to maintain a balanced intake and output will improve Outcome: Not Progressing   Problem: Metabolic: Goal: Ability to maintain appropriate glucose levels will improve Outcome: Not Progressing   Problem: Activity: Goal: Ability to tolerate increased activity will improve Outcome: Not Progressing   Problem: Education: Goal: Knowledge  of General Education information will improve Description: Including pain rating scale, medication(s)/side effects and non-pharmacologic comfort measures Outcome: Not Progressing   Problem: Health Behavior/Discharge Planning: Goal: Ability to manage health-related needs will improve Outcome: Not Progressing   Problem: Activity: Goal: Risk for activity intolerance will decrease Outcome: Not Progressing   Problem: Nutrition: Goal: Adequate nutrition will be maintained Outcome: Not Progressing   Problem: Coping: Goal: Level of anxiety will decrease Outcome: Not Progressing   Problem: Role Relationship: Goal: Method of communication will improve Outcome: Not Applicable   Cindy S. Loreli BSN, RN, CCRP, CCRN 01/20/2023 4:50 AM

## 2023-01-20 NOTE — Progress Notes (Signed)
 PHARMACY - ANTICOAGULATION CONSULT NOTE  Pharmacy Consult for Heparin  Indication: atrial fibrillation  No Known Allergies  Patient Measurements: Height: 5' 8 (172.7 cm) Weight: 66.7 kg (147 lb 0.8 oz) IBW/kg (Calculated) : 68.4 Heparin  Dosing Weight: TBW  Vital Signs: Temp: 97.2 F (36.2 C) (01/12 0345) BP: 108/59 (01/12 0345) Pulse Rate: 121 (01/12 0345)  Labs: Recent Labs    01/18/23 1130 01/18/23 1509 01/18/23 1509 01/18/23 1754 01/19/23 1149 01/19/23 2011 01/20/23 0354  HGB  --  9.3*   < > 8.6* 8.6*  --  7.3*  HCT  --  28.6*   < > 26.4* 26.3*  --  23.0*  PLT  --  151   < > 132* 158  --  158  HEPARINUNFRC  --   --   --   --  0.60 0.73* 0.64  CREATININE 0.99  --   --   --  0.82  --   --   TROPONINIHS  --  124*  --  149*  --   --   --    < > = values in this interval not displayed.    Estimated Creatinine Clearance: 80.2 mL/min (by C-G formula based on SCr of 0.82 mg/dL).    Assessment: [name] presented to ED on 1/10 with fatigue, chest tightness, and SOB. Noted to be in Afib here and during a recent admission. Pharmacy consulted to start IV heparin  for Afib. PMH significant for COPD, EtOH use, HTN, symptomatic PVCs, new Dx SCC of R lung.  No prior anticoagulation except Lovenox  40mg  on 1/8.    Significant events: 1/10 afternoon: still awaiting CBC results prior to starting heparin . At 14:20, patient found possibly seizing; bit tongue which led to copious bleeding. Patient aspirated on blood and subsequently suffered cardiac arrest, requiring a brief course of CPR prior to ROSC. Intubated and admitted to ICU. Remains in Afib CBC: Hgb low/decreased to 8.6, but relatively consistent with recent baseline 9-10s.  Plt down to 132.  CTa negative for PE, CT head negative for bleeding.   Per Jeralyn Banner, NP, OK to start heparin  on 1/10 at 19:00 after CTs and labs were completed and reviewed.    Today, 01/20/23: HL 0.64 therapeutic on 800 units/hr Hgb down to 7.3, plts  158 RN reports continued bloody secretions from OG tube and some bloody mucus coming from rt nare   .    Goal of Therapy:  Heparin  level 0.3-0.7 units/ml Monitor platelets by anticoagulation protocol: Yes   Plan:  Decrease heparin  drip to 750 units/hr Confirmatory level in 6 hours Daily heparin  level and CBC Monitor for bleeding or any other complications of therapy      Leeroy Mace RPh 01/20/2023, 4:18 AM

## 2023-01-20 NOTE — Progress Notes (Signed)
   01/20/23 1015  Vent Select  $ Transport Ventilator  Yes  Invasive or Noninvasive Invasive  Adult Vent Y  Airway 7.5 mm  Placement Date/Time: 01/18/23 1407   Placed By: ED Physician  Airway Device: Endotracheal Tube  ETT Types: Endobronchial  Size (mm): 7.5 mm  Cuffed: Cuffed  Insertion attempts: 1  Airway Equipment: Stylet;Video Laryngoscope  Placement Confirmation: Leona...  Secured at (cm) 27 cm  Measured From Lips  Secured Location Center  Secured By [employer]  Prone position No  Cuff Pressure (cm H2O) Green OR 18-26 CmH2O  Site Condition Dry  Adult Ventilator Settings  Vent Type Servo i  Humidity HME  Vent Mode PRVC  Vt Set 470 mL  Set Rate 16 bmp  FiO2 (%) (S)  60 % (Increased to 60% FI02, tranported with vent and full E cylinders from ICU 1238 to CT scan. Airway secure and stable. Ambu bag @ bedside. 99% Sp02.)

## 2023-01-20 NOTE — Progress Notes (Signed)
 NAME:  Scott Lindsey, MRN:  [id number], DOB:  [birth year], LOS: 2 ADMISSION DATE:  01/18/2023, CONSULTATION DATE: 1/10 REFERRING MD: Elnor, CHIEF COMPLAINT: Cardiac arrest  History of Present Illness:  68 year old male patient with history of COPD, pulmonary fibrosis, ECOG 1 with new Dx metastatic squamous cell carcinoma presumed to be primary lung with mets to liver and lymph nodes Just discharged from the hospital on 1/8 after being treated for pneumonia and COPD exacerbation and receiving the new diagnosis of lung cancer.  Has yet to start therapy. Presented to the [facility] emergency room on 1/10 with chief complaint worsening weakness, fatigue, and gasping for air this morning not able to tolerate really any activity.  EMS was called he was found to be in new A-fib/RVR with heart rate in the 120s to 190s, he was hypoxic and found to have pulse oximetry in the 70s with marked increased work of breathing. While in the ER he remained hypoxic, while staff was working on support patient had sudden episode of what appeared to be seizure, he bit his tongue, shortly after this suffered a PEA cardiac arrest.  Time to return of spontaneous circulation estimated at 11 minutes with ongoing ACLS.  On critical care arrival the patient was hypotensive following 1 L of crystalloid, still continued to demonstrate agonal respiratory efforts on the ventilator.  There were bloody endotracheal tube secretions, patient on propofol , withdrawing to pain in all extremities ER evaluation at time of admission HCO3 20 COVID-negative normal renal function, initial chest x-ray with previously noted right lung base opacity and bibasilar airspace disease Pertinent  Medical History  COPD, pulmonary fibrosis, prior alcoholic pancreatitis, history of remote alcohol  abuse, history of prostate cancer, hypertension, HLD, newly diagnosed metastatic squamous cell carcinoma felt lung primary site with metastasis to liver and lymph  nodes  Significant Hospital Events: Including procedures, antibiotic start and stop dates in addition to other pertinent events   1/10 just discharged on the eighth following admission for pneumonia, COPD exacerbation, and new diagnosis of squamous cell carcinoma, felt pulmonary as primary with metastasis to lymph nodes and liver presents acutely hypoxic with pulse oximetry in the 70s, marked work of breathing gasping for air, and new atrial fibrillation with RVR shortly after presentation to the ER suffered PEA cardiac arrest times ROSC estimated at 11 minutes.  Noted to be somewhat difficult intubation due to blood in airway, patient having bit tongue.  Also ER staff reporting concern about possible aspiration of dentures  Interim History / Subjective:  -Increased myoclonic jerks over the last 24 hours.  Propofol  continued -Evolving shock, norepinephrine  started, currently at 4 -Remains on amiodarone  -I/O+ 7.3 L total -WBC 16.4 > 18.9 -CODE STATUS changed to DNR on 1/11   Objective   Blood pressure 101/70, pulse 79, temperature (!) 97 F (36.1 C), resp. rate 18, height 5' 8 (1.727 m), weight 67.6 kg, SpO2 100%.    Vent Mode: PRVC FiO2 (%):  [35 %-50 %] 40 % Set Rate:  [16 bmp-24 bmp] 16 bmp Vt Set:  [470 mL] 470 mL PEEP:  [5 cmH20] 5 cmH20 Plateau Pressure:  [17 cmH20-22 cmH20] 20 cmH20   Intake/Output Summary (Last 24 hours) at 01/20/2023 0759 Last data filed at 01/20/2023 0600 Gross per 24 hour  Intake 5218.03 ml  Output 940 ml  Net 4278.03 ml   Filed Weights   01/18/23 1733 01/19/23 0500 01/20/23 0500  Weight: 60.6 kg 66.7 kg 67.6 kg    Examination: General: Critically  ill, ventilated HENT: ET tube in good position, some scant secretions Lungs: Scattered bilateral rhonchi, no wheeze Cardiac regular, 70s, no murmur Abdomen nondistended with positive bowel sounds Extremities no edema Neuro: Unresponsive.  Still with left facial twitching and jaw movement consistent with  myoclonus.  Withdraws to pain bilateral upper and lower extremities.  Does not follow commands.  No purposeful movement    Resolved Hospital Problem list     Assessment & Plan:  Acute hypoxic respiratory failure with postobstructive physiology from his newly diagnosed lung cancer.  Consider recurrent obstructive pneumonia known h/o COPD and newly diagnosed metastatic squamous cell carcinoma (presumed lung primary w/ mets to liver and LNs) Presumed contribution of new onset atrial fibrillation to his decompensation, respiratory failure -No evidence of PE on CT chest Plan -PRVC 8 cc/kg -Scheduled bronchodilators Brovana  and Yupelri  -Scheduled Pulmicort  nebs -Continue Solu-Medrol  and taper -Atrial fibrillation management as below  S/p PEA cardiac arrest.  No evidence of pulmonary embolism on CT, question primary cause atrial fibrillation with hemodynamic collapse.  Consider also contribution of seizures Plan -Echocardiogram with septal dyssynergy, likely impact of his mediastinal/hilar mass -No clear evidence seizures on Cerebell, but possible epileptiform discharges.  Formal EEG recommended -Continue to treat underlying conditions contributors including metabolic disarray.  Unfortunately suspect that his underlying malignancy is a driver here.  Unclear whether he will stabilize in order to definitively treat  Circulatory shock status postcardiac arrest.  Suspect at least somewhat due to sedating medications and relative hypovolemia given his response to volume resuscitation, but suspect obstructive shock and impact of mediastinal/hilar mass with cardiogenic component.  Atrial fibrillation also likely a contributor Plan -Wean norepinephrine  as able -Home antihypertensive regimen is on hold (Cozaar , clonidine , metoprolol )  A-fib with RVR.  Suspect this is secondary to his acute illness Plan -Continue amiodarone  -Continue heparin  -Telemetry monitoring  Acute metabolic  encephalopathy Seizures, question primary versus due to acute cardiopulmonary arrest Myoclonus -Head CT reassuring, heparin  was ordered as above Plan -Cerebell with possible epileptiform discharges but no overt seizures.  Formal EEG recommended, ordered -Start empiric Keppra  500 mg twice daily -Have explained to patient's wife that myoclonus portends a very poor neurologic prognosis.  Have recommended that we will need to consider possible withdrawal of care depending on course.  Plan to order MRI brain to better characterize hypoxic injury, help guide discussions  Possible swallowing versus aspiration of partial denture Plan -No evidence of foreign body on CT chest, chest x-ray  Anemia with bloody oral secretions after biting tongue Current hemoglobin not reflecting bloody oral secretions as was obtained prior to seizure Plan -Follow for any evidence of recurrent bleeding, follow CBC on heparin   Steroid-induced hyperglycemia Plan Sliding scale insulin  as ordered  Best Practice (right click and Reselect all SmartList Selections daily)   Diet/type: NPO DVT prophylaxis other Pressure ulcer(s): N/A GI prophylaxis: PPI Lines: N/A Foley:  Yes, and it is still needed Code Status:  full code Last date of multidisciplinary goals of care discussion [1/11 discussed patient's status, prognosis with wife by phone.  CODE STATUS DNR.  She understands that we may need to broach the subject of formal withdrawal of care in coming days]  Labs   CBC: Recent Labs  Lab 01/14/23 0421 01/15/23 0407 01/18/23 1509 01/18/23 1754 01/19/23 1149 01/20/23 0354  WBC 11.7* 11.1* 18.8* 17.0* 16.4* 18.9*  NEUTROABS 7.7  --  15.6*  --   --   --   HGB 9.6* 10.1* 9.3* 8.6* 8.6* 7.3*  HCT  30.9* 32.2* 28.6* 26.4* 26.3* 23.0*  MCV 98.7 98.8 97.3 96.0 94.9 97.9  PLT 182 187 151 132* 158 158    Basic Metabolic Panel: Recent Labs  Lab 01/14/23 0421 01/18/23 1130 01/19/23 1149 01/20/23 0354  NA 135  135 134* 136  K 3.9 4.7 3.6 3.7  CL 102 103 105 107  CO2 23 20* 21* 21*  GLUCOSE 96 82 162* 209*  BUN 17 14 20  24*  CREATININE 0.77 0.99 0.82 0.78  CALCIUM  9.8 10.2 9.4 9.0  MG 1.8  --  1.7  --   PHOS  --   --  4.0  --    GFR: Estimated Creatinine Clearance: 83.3 mL/min (by C-G formula based on SCr of 0.78 mg/dL). Recent Labs  Lab 01/18/23 1509 01/18/23 1754 01/19/23 1149 01/20/23 0354  PROCALCITON  --  0.56 2.71 2.21  WBC 18.8* 17.0* 16.4* 18.9*  LATICACIDVEN  --  3.8*  --   --     Liver Function Tests: Recent Labs  Lab 01/20/23 0354  AST 47*  ALT 92*  ALKPHOS 57  BILITOT 0.6  PROT 5.8*  ALBUMIN 2.2*   No results for input(s): LIPASE, AMYLASE in the last 168 hours. No results for input(s): AMMONIA in the last 168 hours.  ABG    Component Value Date/Time   PHART 7.43 01/20/2023 0326   PCO2ART 30 (L) 01/20/2023 0326   PO2ART 102 01/20/2023 0326   HCO3 20.1 01/20/2023 0326   ACIDBASEDEF 3.4 (H) 01/20/2023 0326   O2SAT 99.7 01/20/2023 0326     Coagulation Profile: No results for input(s): INR, PROTIME in the last 168 hours.  Cardiac Enzymes: No results for input(s): CKTOTAL, CKMB, CKMBINDEX, TROPONINI in the last 168 hours.  HbA1C: Hgb A1c MFr Bld  Date/Time Value Ref Range Status  01/18/2023 05:54 PM 6.3 (H) 4.8 - 5.6 % Final    Comment:    (NOTE) Pre diabetes:          5.7%-6.4%  Diabetes:              >6.4%  Glycemic control for   <7.0% adults with diabetes     CBG: Recent Labs  Lab 01/20/23 0227 01/20/23 0246 01/20/23 0400 01/20/23 0624 01/20/23 0757  GLUCAP 49* 121* 164* 132* 115*     Critical care time: 33 min      Lamar Chris, MD, PhD 01/20/2023, 7:59 AM [facility] [phone] or if no answer before 7:00PM call [phone] For any issues after 7:00PM please call eLink [phone]

## 2023-01-21 ENCOUNTER — Ambulatory Visit: Payer: Medicare Other

## 2023-01-21 ENCOUNTER — Telehealth: Payer: Self-pay

## 2023-01-21 ENCOUNTER — Ambulatory Visit: Payer: Self-pay

## 2023-01-21 DIAGNOSIS — I469 Cardiac arrest, cause unspecified: Secondary | ICD-10-CM | POA: Diagnosis not present

## 2023-01-21 LAB — COMPREHENSIVE METABOLIC PANEL
ALT: 67 U/L — ABNORMAL HIGH (ref 0–44)
AST: 24 U/L (ref 15–41)
Albumin: 2 g/dL — ABNORMAL LOW (ref 3.5–5.0)
Alkaline Phosphatase: 56 U/L (ref 38–126)
Anion gap: 7 (ref 5–15)
BUN: 27 mg/dL — ABNORMAL HIGH (ref 8–23)
CO2: 21 mmol/L — ABNORMAL LOW (ref 22–32)
Calcium: 9.2 mg/dL (ref 8.9–10.3)
Chloride: 110 mmol/L (ref 98–111)
Creatinine, Ser: 1.06 mg/dL (ref 0.61–1.24)
GFR, Estimated: >60 mL/min (ref >60)
Glucose, Bld: 123 mg/dL — ABNORMAL HIGH (ref 70–99)
Potassium: 3.9 mmol/L (ref 3.5–5.1)
Sodium: 138 mmol/L (ref 135–145)
Total Bilirubin: 0.4 mg/dL (ref 0.0–1.2)
Total Protein: 5.5 g/dL — ABNORMAL LOW (ref 6.5–8.1)

## 2023-01-21 LAB — GLUCOSE, CAPILLARY
Glucose-Capillary: 111 mg/dL — ABNORMAL HIGH (ref 70–99)
Glucose-Capillary: 112 mg/dL — ABNORMAL HIGH (ref 70–99)
Glucose-Capillary: 114 mg/dL — ABNORMAL HIGH (ref 70–99)
Glucose-Capillary: 116 mg/dL — ABNORMAL HIGH (ref 70–99)
Glucose-Capillary: 127 mg/dL — ABNORMAL HIGH (ref 70–99)
Glucose-Capillary: 75 mg/dL (ref 70–99)
Glucose-Capillary: 81 mg/dL (ref 70–99)

## 2023-01-21 LAB — CBC
HCT: 22.4 % — ABNORMAL LOW (ref 39.0–52.0)
Hemoglobin: 7 g/dL — ABNORMAL LOW (ref 13.0–17.0)
MCH: 30.8 pg (ref 26.0–34.0)
MCHC: 31.3 g/dL (ref 30.0–36.0)
MCV: 98.7 fL (ref 80.0–100.0)
Platelets: 114 10*3/uL — ABNORMAL LOW (ref 150–400)
RBC: 2.27 MIL/uL — ABNORMAL LOW (ref 4.22–5.81)
RDW: 15.9 % — ABNORMAL HIGH (ref 11.5–15.5)
WBC: 14.8 10*3/uL — ABNORMAL HIGH (ref 4.0–10.5)
nRBC: 0.3 % — ABNORMAL HIGH (ref 0.0–0.2)

## 2023-01-21 LAB — PROCALCITONIN: Procalcitonin: 1.16 ng/mL

## 2023-01-21 LAB — HEPARIN LEVEL (UNFRACTIONATED): Heparin Unfractionated: 0.55 [IU]/mL (ref 0.30–0.70)

## 2023-01-21 NOTE — Progress Notes (Signed)
 PHARMACY - ANTICOAGULATION CONSULT NOTE  Pharmacy Consult for Heparin  Indication: atrial fibrillation  No Known Allergies  Patient Measurements: Height: 5' 8 (172.7 cm) Weight: 67.6 kg (149 lb 0.5 oz) IBW/kg (Calculated) : 68.4 Heparin  Dosing Weight: TBW  Vital Signs: Temp: 95.7 F (35.4 C) (01/13 0630) Temp Source: Esophageal (01/12 2000) BP: 114/83 (01/13 0630) Pulse Rate: 52 (01/13 0630)  Labs: Recent Labs    01/18/23 1509 01/18/23 1754 01/19/23 1149 01/19/23 2011 01/20/23 0354 01/20/23 0931 01/21/23 0517  HGB 9.3* 8.6* 8.6*  --  7.3*  --  7.0*  HCT 28.6* 26.4* 26.3*  --  23.0*  --  22.4*  PLT 151 132* 158  --  158  --  114*  HEPARINUNFRC  --   --  0.60   < > 0.64 0.57 0.55  CREATININE  --   --  0.82  --  0.78  --  1.06  TROPONINIHS 124* 149*  --   --   --   --   --    < > = values in this interval not displayed.    Estimated Creatinine Clearance: 62.9 mL/min (by C-G formula based on SCr of 1.06 mg/dL).    Assessment: 57 yoM presented to ED on 1/10 with fatigue, chest tightness, and SOB. Noted to be in Afib here and during a recent admission. Pharmacy consulted to start heparin  infusion for Afib. PMH significant for COPD, EtOH use, HTN, symptomatic PVCs, new Dx SCC of R lung.  No prior anticoagulation.    Significant events: 1/10 afternoon: still awaiting CBC results prior to starting heparin . At 14:20, patient found possibly seizing; bit tongue which led to copious bleeding. Patient aspirated on blood and subsequently suffered cardiac arrest, requiring a brief course of CPR prior to ROSC. Intubated and admitted to ICU. Remains in Afib. CBC: Hgb low/decreased to 8.6, but relatively consistent with recent baseline 9-10s.  Plt down to 132. CTa negative for PE, CT head negative for bleeding. Per CCM, OK to start heparin  on 1/10 at 19:00 after CTs and labs were completed and reviewed.    Today, 01/21/23: HL 0.55 - remains therapeutic on 750 units/hr Hgb down to  7.0, plts slight trend down .    Goal of Therapy:  Heparin  level 0.3-0.7 units/ml Monitor platelets by anticoagulation protocol: Yes   Plan:  -Continue heparin  infusion at 750 units/hr -Daily heparin  level and CBC -Monitor for bleeding or any other complications of therapy     Stefano MARLA Bologna, PharmD, BCPS Clinical Pharmacist 01/21/2023 7:17 AM

## 2023-01-21 NOTE — Progress Notes (Signed)
 Radiation appointments have been canceled due to patient's family decision to transition patient to comfort care. Radiation therapists updated on plan and EOT note with be created.

## 2023-01-21 NOTE — Progress Notes (Addendum)
 Arrived to room pt is transition to comfort care. EEG DC per RN.

## 2023-01-21 NOTE — Progress Notes (Signed)
 Complex Care Management Note Care Guide Note  01/21/2023 Name: Scott Lindsey MRN: [id number] DOB: 1955-06-26   Complex Care Management Outreach Attempts: An unsuccessful telephone outreach was attempted today to offer the patient information about available complex care management services.  Follow Up Plan:  No further outreach attempts will be made at this time. We have been unable to contact the patient to offer or enroll patient in complex care management services.  Encounter Outcome:  No Answer Left message for patient's wife Scott Lindsey to return my call regarding transportation assistance. Her husband is currently admitted at [facility] left message for her to call later if transportation assistance is still needed. Closing referral for now.   Lexington Krotz [facility]  [facility], [facility] Guide Direct Dial: [phone]  Website: [email or web address]

## 2023-01-21 NOTE — Plan of Care (Signed)
  Problem: Education: Goal: Knowledge of General Education information will improve Description: Including pain rating scale, medication(s)/side effects and non-pharmacologic comfort measures Outcome: Progressing   Problem: Clinical Measurements: Goal: Ability to maintain clinical measurements within normal limits will improve Outcome: Not Progressing Goal: Diagnostic test results will improve Outcome: Not Progressing

## 2023-01-21 NOTE — Progress Notes (Signed)
 eLink Physician-Brief Progress Note Patient Name: Scott Lindsey DOB: June 17, 1955 MRN: [id number]   Date of Service  01/21/2023  HPI/Events of Note  68 year old male patient with history of COPD, pulmonary fibrosis, ECOG 1 with new Dx metastatic squamous cell carcinoma presumed to be primary lung with mets to liver and lymph nodes.  Status post cardiac arrest with circulatory shock and anoxic brain injury.  Anticipating transition to comfort measures 1/14.  -  Blisters under the tegaderm where his Heparin  is infusing  eICU Interventions  For now, defer intervention.  Minimize new pokes.  As long as there is no new erythema or worsening edema, can maintain current IV.     Intervention Category Minor Interventions: Routine modifications to care plan (e.g. PRN medications for pain, fever)  Scott Lindsey 01/21/2023, 10:59 PM

## 2023-01-21 NOTE — Progress Notes (Signed)
 NAME:  Scott Lindsey, MRN:  [id number], DOB:  April 20, 1955, LOS: 3 ADMISSION DATE:  01/18/2023, CONSULTATION DATE: 1/10 REFERRING MD: Elnor, CHIEF COMPLAINT: Cardiac arrest  History of Present Illness:  68 year old male patient with history of COPD, pulmonary fibrosis, ECOG 1 with new Dx metastatic squamous cell carcinoma presumed to be primary lung with mets to liver and lymph nodes Just discharged from the hospital on 1/8 after being treated for pneumonia and COPD exacerbation and receiving the new diagnosis of lung cancer.  Has yet to start therapy. Presented to the [facility] emergency room on 1/10 with chief complaint worsening weakness, fatigue, and gasping for air this morning not able to tolerate really any activity.  EMS was called he was found to be in new A-fib/RVR with heart rate in the 120s to 190s, he was hypoxic and found to have pulse oximetry in the 70s with marked increased work of breathing. While in the ER he remained hypoxic, while staff was working on support patient had sudden episode of what appeared to be seizure, he bit his tongue, shortly after this suffered a PEA cardiac arrest.  Time to return of spontaneous circulation estimated at 11 minutes with ongoing ACLS.  On critical care arrival the patient was hypotensive following 1 L of crystalloid, still continued to demonstrate agonal respiratory efforts on the ventilator.  There were bloody endotracheal tube secretions, patient on propofol , withdrawing to pain in all extremities ER evaluation at time of admission HCO3 20 COVID-negative normal renal function, initial chest x-ray with previously noted right lung base opacity and bibasilar airspace disease Pertinent  Medical History  COPD, pulmonary fibrosis, prior alcoholic pancreatitis, history of remote alcohol  abuse, history of prostate cancer, hypertension, HLD, newly diagnosed metastatic squamous cell carcinoma felt lung primary site with metastasis to liver and lymph  nodes  Significant Hospital Events: Including procedures, antibiotic start and stop dates in addition to other pertinent events   1/10 just discharged on the eighth following admission for pneumonia, COPD exacerbation, and new diagnosis of squamous cell carcinoma, felt pulmonary as primary with metastasis to lymph nodes and liver presents acutely hypoxic with pulse oximetry in the 70s, marked work of breathing gasping for air, and new atrial fibrillation with RVR shortly after presentation to the ER suffered PEA cardiac arrest times ROSC estimated at 11 minutes.  Noted to be somewhat difficult intubation due to blood in airway, patient having bit tongue.  Also ER staff reporting concern about possible aspiration of dentures  Interim History / Subjective:   No acute events overnight Continue to have myoclonic jerks Updated son at bedside and wife/daughter via video visit about MRI results showing anoxic brain injury.    Objective   Blood pressure (!) 120/49, pulse 79, temperature (!) 96.6 F (35.9 C), resp. rate (!) 51, height 5' 8 (1.727 m), weight 67.6 kg, SpO2 99%.    Vent Mode: PRVC FiO2 (%):  [35 %] 35 % Set Rate:  [16 bmp] 16 bmp Vt Set:  [470 mL] 470 mL PEEP:  [5 cmH20] 5 cmH20 Plateau Pressure:  [17 cmH20-20 cmH20] 17 cmH20   Intake/Output Summary (Last 24 hours) at 01/21/2023 1048 Last data filed at 01/21/2023 1011 Gross per 24 hour  Intake 1957.56 ml  Output 575 ml  Net 1382.56 ml   Filed Weights   01/18/23 1733 01/19/23 0500 01/20/23 0500  Weight: 60.6 kg 66.7 kg 67.6 kg    Examination: General: Critically ill, intubated HENT: ET tube in good position, myoclonic  jerks of mouth/lips Lungs: course breath sounds bilaterally Cardiac rrr Abdomen nondistended with positive bowel sounds Extremities no edema Neuro: Unresponsive.  Still with left facial twitching and jaw movement consistent with myoclonus.  Withdraws to pain bilateral upper and lower extremities.  Does not  follow commands.  No purposeful movement    Resolved Hospital Problem list     Assessment & Plan:  Acute hypoxic respiratory failure Pulmonary Hypertension in setting of extrenal compression of pulmonary artery due to lung cancer COPD with acute exacerbation Presumed contribution of new onset atrial fibrillation to his decompensation, respiratory failure -No evidence of PE on CT chest Plan -PRVC 8 cc/kg -Scheduled bronchodilators Brovana  and Yupelri  -Scheduled Pulmicort  nebs -Continue Solu-Medrol  and taper -Atrial fibrillation management as below  S/p PEA cardiac arrest.  No evidence of pulmonary embolism on CT, question primary cause atrial fibrillation with hemodynamic collapse.  Consider also contribution of seizures Plan -Echocardiogram with septal dyssynergy, likely impact of his mediastinal/hilar mass -No clear evidence seizures on Cerebell, but possible epileptiform discharges.  Formal EEG recommended -Continue to treat underlying conditions contributors including metabolic disarray.    Circulatory shock status postcardiac arrest.  Suspect at least somewhat due to sedating medications and relative hypovolemia given his response to volume resuscitation, but suspect obstructive shock and impact of mediastinal/hilar mass with cardiogenic component.  Atrial fibrillation also likely a contributor Plan -Wean norepinephrine  as able -Home antihypertensive regimen is on hold (Cozaar , clonidine , metoprolol )  A-fib with RVR.  Suspect this is secondary to his acute illness Plan -Continue amiodarone  -Continue heparin  -Telemetry monitoring  Acute Anoxic Brain Injury Myoclonus -Cerebell with possible epileptiform discharges but no overt seizures.  Formal EEG recommended, ordered -Continue empiric Keppra  500 mg twice daily - MRI has confirmed anoxic brain injury, reviewed with family about the devastating injury.   Anemia with bloody oral secretions after biting tongue Current  hemoglobin not reflecting bloody oral secretions as was obtained prior to seizure Plan -Follow for any evidence of recurrent bleeding, follow CBC on heparin   Steroid-induced hyperglycemia Plan Sliding scale insulin  as ordered  Best Practice (right click and Reselect all SmartList Selections daily)   Diet/type: NPO DVT prophylaxis other Pressure ulcer(s): N/A GI prophylaxis: PPI Lines: N/A Foley:  Yes, and it is still needed Code Status:  full code Last date of multidisciplinary goals of care discussion [1/13 discussed patient's status and MRI results with son in room and wife/daughter via video visit. I have recommended transition to comfort care, which they agree with, but are digesting the recent events. They will let us  know when they are ready to transition.]  Labs   CBC: Recent Labs  Lab 01/18/23 1509 01/18/23 1754 01/19/23 1149 01/20/23 0354 01/21/23 0517  WBC 18.8* 17.0* 16.4* 18.9* 14.8*  NEUTROABS 15.6*  --   --   --   --   HGB 9.3* 8.6* 8.6* 7.3* 7.0*  HCT 28.6* 26.4* 26.3* 23.0* 22.4*  MCV 97.3 96.0 94.9 97.9 98.7  PLT 151 132* 158 158 114*    Basic Metabolic Panel: Recent Labs  Lab 01/18/23 1130 01/19/23 1149 01/20/23 0354 01/21/23 0517  NA 135 134* 136 138  K 4.7 3.6 3.7 3.9  CL 103 105 107 110  CO2 20* 21* 21* 21*  GLUCOSE 82 162* 209* 123*  BUN 14 20 24* 27*  CREATININE 0.99 0.82 0.78 1.06  CALCIUM  10.2 9.4 9.0 9.2  MG  --  1.7  --   --   PHOS  --  4.0  --   --  GFR: Estimated Creatinine Clearance: 62.9 mL/min (by C-G formula based on SCr of 1.06 mg/dL). Recent Labs  Lab 01/18/23 1754 01/19/23 1149 01/20/23 0354 01/21/23 0517  PROCALCITON 0.56 2.71 2.21 1.16  WBC 17.0* 16.4* 18.9* 14.8*  LATICACIDVEN 3.8*  --   --   --     Liver Function Tests: Recent Labs  Lab 01/20/23 0354 01/21/23 0517  AST 47* 24  ALT 92* 67*  ALKPHOS 57 56  BILITOT 0.6 0.4  PROT 5.8* 5.5*  ALBUMIN 2.2* 2.0*   No results for input(s): LIPASE,  AMYLASE in the last 168 hours. No results for input(s): AMMONIA in the last 168 hours.  ABG    Component Value Date/Time   PHART 7.43 01/20/2023 0326   PCO2ART 30 (L) 01/20/2023 0326   PO2ART 102 01/20/2023 0326   HCO3 20.1 01/20/2023 0326   ACIDBASEDEF 3.4 (H) 01/20/2023 0326   O2SAT 99.7 01/20/2023 0326     Coagulation Profile: No results for input(s): INR, PROTIME in the last 168 hours.  Cardiac Enzymes: No results for input(s): CKTOTAL, CKMB, CKMBINDEX, TROPONINI in the last 168 hours.  HbA1C: Hgb A1c MFr Bld  Date/Time Value Ref Range Status  01/18/2023 05:54 PM 6.3 (H) 4.8 - 5.6 % Final    Comment:    (NOTE) Pre diabetes:          5.7%-6.4%  Diabetes:              >6.4%  Glycemic control for   <7.0% adults with diabetes     CBG: Recent Labs  Lab 01/20/23 2011 01/21/23 0006 01/21/23 0411 01/21/23 0822 01/21/23 0845  GLUCAP 128* 116* 112* 81 111*     Critical care time: 45 min      Dorn Chill, MD [facility] Office: [phone]   See Amion for personal pager PCCM on call pager [phone] until 7pm. Please call Elink 7p-7a. [phone]

## 2023-01-21 NOTE — Telephone Encounter (Signed)
 FYI

## 2023-01-21 NOTE — TOC Initial Note (Signed)
 Transition of Care [facility]) - Initial/Assessment Note    Patient Details  Name: Scott Lindsey MRN: [id number] Date of Birth: [birth year]  [facility]) CM/SW Contact:    Tawni CHRISTELLA Eva, LCSW Phone Number: 01/21/2023, 2:53 PM  Clinical Narrative:                 Noted pt is from home and currently intubated. Per chart reviewed pt is now comfort care. TOC to follow for d/c needs.    Expected Discharge Plan:  (TBD) Barriers to Discharge: Continued Medical Work up   Patient Goals and CMS Choice            Expected Discharge Plan and Services                                              Prior Living Arrangements/Services                       Activities of Daily Living      Permission Sought/Granted                  Emotional Assessment              Admission diagnosis:  Cardiac arrest (HCC) [I46.9] Acute respiratory failure (HCC) [J96.00] Seizure (HCC) [R56.9] Exertional dyspnea [R06.09] Atrial fibrillation with rapid ventricular response (HCC) [I48.91] Acute respiratory failure with hypoxia (HCC) [J96.01] Endotracheally intubated [Z97.8] Patient Active Problem List   Diagnosis Date Noted   Acute respiratory failure (HCC) 01/18/2023   Cardiac arrest (HCC) 01/18/2023   Squamous cell carcinoma of middle lobe of right lung (HCC) 01/16/2023   Protein-calorie malnutrition, severe 01/14/2023   Lung mass 01/07/2023   COPD (chronic obstructive pulmonary disease) (HCC) 01/06/2023   COPD exacerbation (HCC) 01/06/2023   Cough 10/12/2022   Wheezing 10/12/2022   Abnormal breath sounds 10/12/2022   Weight loss 08/02/2022   Subacute cough 01/25/2022   Thrombocytopenia (HCC) 01/25/2022   Pulmonary fibrosis (HCC) 01/25/2022   Bradycardia 08/03/2021   Elevated total protein 04/28/2021   Alcohol  withdrawal syndrome without complication (HCC) 04/13/2021   Normocytic anemia 04/10/2021   Simple chronic bronchitis (HCC) 08/11/2020    Need for shingles vaccine 08/11/2020   Atherosclerosis of aorta (HCC) 08/09/2020   Alcohol -induced acute pancreatitis without infection or necrosis 07/05/2020   Symptomatic PVCs 03/17/2019   Hypomagnesemia 03/17/2019   Drug-induced hypokalemia 08/20/2018   Dietary folate deficiency anemia 09/26/2017   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 [place] [address] Phone: [phone] Fax: [phone]     Social Drivers of Health (SDOH) Social History: SDOH Screenings   Food Insecurity: Patient Unable To Answer (01/19/2023)  Housing: Patient Unable To Answer (01/19/2023)  Transportation Needs: Patient Unable To Answer (01/19/2023)  Recent Concern: Transportation Needs - Unmet Transportation Needs (01/17/2023)  Utilities: Patient Unable To  Answer (01/19/2023)  Depression (PHQ2-9): Low Risk  (10/12/2022)  Social Connections: Unknown (01/19/2023)  Tobacco Use: High Risk (01/18/2023)   SDOH Interventions:     Readmission Risk Interventions     No data to display

## 2023-01-21 NOTE — Telephone Encounter (Signed)
 FYI   Copied from CRM [id number]. Topic: General - Other >> Jan 21, 2023 11:23 AM Susanna ORN wrote: Reason for CRM: Patient's wife, Scott Lindsey, called to let Dr. Joshua know that Scott Lindsey is currently in ICU over at [facility]. She states that he had a massive heart attack & they let him came home on Wednesday & Thursday. She stated on Friday morning he woke up & could not breathe so she called 911. Mrs. Mancia states they do not expect him to live as he's currently on life support. He has a brain injury, lung failure, and she stated they already knew of the nodules on the lungs which she stated Dr. Joshua stated it was a mass. She has cancelled the appt that was for 01/20 & 01/23 as well. Mrs. Milnes wanted to let Dr. Joshua & nurse know of the situation & also wanted to thank him & nurses for everything. She left her number to call if Dr. Joshua or nurse needed to contact her. Mrs. Waymire stated she would keep us  updated. CB #: [id number].

## 2023-01-22 ENCOUNTER — Encounter: Payer: Self-pay | Admitting: Radiation Oncology

## 2023-01-22 ENCOUNTER — Ambulatory Visit: Payer: Medicare Other

## 2023-01-22 ENCOUNTER — Ambulatory Visit: Payer: Medicare Other | Admitting: Internal Medicine

## 2023-01-22 ENCOUNTER — Telehealth: Payer: Self-pay

## 2023-01-22 ENCOUNTER — Other Ambulatory Visit: Payer: Medicare Other

## 2023-01-22 DIAGNOSIS — I469 Cardiac arrest, cause unspecified: Secondary | ICD-10-CM | POA: Diagnosis not present

## 2023-01-22 LAB — BASIC METABOLIC PANEL
Anion gap: 12 (ref 5–15)
BUN: 29 mg/dL — ABNORMAL HIGH (ref 8–23)
CO2: 19 mmol/L — ABNORMAL LOW (ref 22–32)
Calcium: 8.8 mg/dL — ABNORMAL LOW (ref 8.9–10.3)
Chloride: 100 mmol/L (ref 98–111)
Creatinine, Ser: 1.02 mg/dL (ref 0.61–1.24)
GFR, Estimated: >60 mL/min (ref >60)
Glucose, Bld: 416 mg/dL — ABNORMAL HIGH (ref 70–99)
Potassium: 3.3 mmol/L — ABNORMAL LOW (ref 3.5–5.1)
Sodium: 131 mmol/L — ABNORMAL LOW (ref 135–145)

## 2023-01-22 LAB — MAGNESIUM: Magnesium: 2.1 mg/dL (ref 1.7–2.4)

## 2023-01-22 LAB — CBC
HCT: 22.8 % — ABNORMAL LOW (ref 39.0–52.0)
Hemoglobin: 7.1 g/dL — ABNORMAL LOW (ref 13.0–17.0)
MCH: 31 pg (ref 26.0–34.0)
MCHC: 31.1 g/dL (ref 30.0–36.0)
MCV: 99.6 fL (ref 80.0–100.0)
Platelets: 123 10*3/uL — ABNORMAL LOW (ref 150–400)
RBC: 2.29 MIL/uL — ABNORMAL LOW (ref 4.22–5.81)
RDW: 15.9 % — ABNORMAL HIGH (ref 11.5–15.5)
WBC: 15.7 10*3/uL — ABNORMAL HIGH (ref 4.0–10.5)
nRBC: 0.9 % — ABNORMAL HIGH (ref 0.0–0.2)

## 2023-01-22 LAB — PHOSPHORUS: Phosphorus: 2.7 mg/dL (ref 2.5–4.6)

## 2023-01-22 LAB — GLUCOSE, CAPILLARY
Glucose-Capillary: 111 mg/dL — ABNORMAL HIGH (ref 70–99)
Glucose-Capillary: 120 mg/dL — ABNORMAL HIGH (ref 70–99)
Glucose-Capillary: 135 mg/dL — ABNORMAL HIGH (ref 70–99)
Glucose-Capillary: 49 mg/dL — ABNORMAL LOW (ref 70–99)
Glucose-Capillary: 75 mg/dL (ref 70–99)
Glucose-Capillary: 80 mg/dL (ref 70–99)
Glucose-Capillary: 81 mg/dL (ref 70–99)
Glucose-Capillary: 85 mg/dL (ref 70–99)

## 2023-01-22 LAB — HEPARIN LEVEL (UNFRACTIONATED): Heparin Unfractionated: 0.53 [IU]/mL (ref 0.30–0.70)

## 2023-01-22 MED ORDER — ALTEPLASE 2 MG IJ SOLR
2.0000 mg | Freq: Once | INTRAMUSCULAR | Status: DC
  Filled 2023-01-22: qty 2

## 2023-01-22 MED ORDER — DEXTROSE 50 % IV SOLN
INTRAVENOUS | Status: AC
  Administered 2023-01-22: 50 mL via INTRAVENOUS
  Filled 2023-01-22: qty 50

## 2023-01-22 MED ORDER — DEXTROSE 50 % IV SOLN: 1.0000 | Freq: Once | INTRAVENOUS | Status: AC

## 2023-01-22 MED ORDER — POTASSIUM CHLORIDE 20 MEQ PO PACK
60.0000 meq | PACK | Freq: Once | ORAL | Status: AC
  Administered 2023-01-22: 60 meq
  Filled 2023-01-22: qty 3

## 2023-01-22 NOTE — Progress Notes (Signed)
 eLink Physician-Brief Progress Note Patient Name: SHAKIL DIRK DOB: May 18, 1955 MRN: [id number]   Date of Service  01/22/2023  HPI/Events of Note  Potassium 3.3  eICU Interventions  KCl ordered     Intervention Category Minor Interventions: Electrolytes abnormality - evaluation and management  Ora Bollig 01/22/2023, 8:29 PM

## 2023-01-22 NOTE — Progress Notes (Signed)
 PHARMACY - ANTICOAGULATION CONSULT NOTE  Pharmacy Consult for Heparin  Indication: atrial fibrillation  No Known Allergies  Patient Measurements: Height: 5' 8 (172.7 cm) Weight: 72.9 kg (160 lb 11.5 oz) IBW/kg (Calculated) : 68.4 Heparin  Dosing Weight: TBW  Vital Signs: Temp: 98.4 F (36.9 C) (01/14 0700) Temp Source: Axillary (01/14 0024) BP: 98/48 (01/14 0700) Pulse Rate: 54 (01/14 0700)  Labs: Recent Labs    01/20/23 0354 01/20/23 0931 01/21/23 0517 01/22/23 0435  HGB 7.3*  --  7.0* 7.1*  HCT 23.0*  --  22.4* 22.8*  PLT 158  --  114* 123*  HEPARINUNFRC 0.64 0.57 0.55 0.53  CREATININE 0.78  --  1.06 1.02    Estimated Creatinine Clearance: 66.1 mL/min (by C-G formula based on SCr of 1.02 mg/dL).    Assessment: 31 yoM presented to ED on 1/10 with fatigue, chest tightness, and SOB. Noted to be in Afib here and during a recent admission. Pharmacy consulted to start heparin  infusion for Afib. PMH significant for COPD, EtOH use, HTN, symptomatic PVCs, new Dx SCC of R lung.  No prior anticoagulation.     Today, 01/22/23: HL 0.53 - remains therapeutic on 750 units/hr Hgb low but stable, plts slight trend down .    Goal of Therapy:  Heparin  level 0.3-0.7 units/ml Monitor platelets by anticoagulation protocol: Yes   Plan:  -Continue heparin  infusion at 750 units/hr -Daily heparin  level and CBC while on infusion     Stefano MARLA Bologna, PharmD, BCPS Clinical Pharmacist 01/22/2023 10:06 AM

## 2023-01-22 NOTE — Progress Notes (Signed)
 NAME:  Scott Lindsey, MRN:  [id number], DOB:  05-28-1955, LOS: 4 ADMISSION DATE:  01/18/2023, CONSULTATION DATE: 1/10 REFERRING MD: Elnor, CHIEF COMPLAINT: Cardiac arrest  History of Present Illness:  68 year old male patient with history of COPD, pulmonary fibrosis, ECOG 1 with new Dx metastatic squamous cell carcinoma presumed to be primary lung with mets to liver and lymph nodes Just discharged from the hospital on 1/8 after being treated for pneumonia and COPD exacerbation and receiving the new diagnosis of lung cancer.  Has yet to start therapy. Presented to the [facility] emergency room on 1/10 with chief complaint worsening weakness, fatigue, and gasping for air this morning not able to tolerate really any activity.  EMS was called he was found to be in new A-fib/RVR with heart rate in the 120s to 190s, he was hypoxic and found to have pulse oximetry in the 70s with marked increased work of breathing. While in the ER he remained hypoxic, while staff was working on support patient had sudden episode of what appeared to be seizure, he bit his tongue, shortly after this suffered a PEA cardiac arrest.  Time to return of spontaneous circulation estimated at 11 minutes with ongoing ACLS.  On critical care arrival the patient was hypotensive following 1 L of crystalloid, still continued to demonstrate agonal respiratory efforts on the ventilator.  There were bloody endotracheal tube secretions, patient on propofol , withdrawing to pain in all extremities ER evaluation at time of admission HCO3 20 COVID-negative normal renal function, initial chest x-ray with previously noted right lung base opacity and bibasilar airspace disease Pertinent  Medical History  COPD, pulmonary fibrosis, prior alcoholic pancreatitis, history of remote alcohol  abuse, history of prostate cancer, hypertension, HLD, newly diagnosed metastatic squamous cell carcinoma felt lung primary site with metastasis to liver and lymph  nodes  Significant Hospital Events: Including procedures, antibiotic start and stop dates in addition to other pertinent events   1/10 just discharged on the eighth following admission for pneumonia, COPD exacerbation, and new diagnosis of squamous cell carcinoma, felt pulmonary as primary with metastasis to lymph nodes and liver presents acutely hypoxic with pulse oximetry in the 70s, marked work of breathing gasping for air, and new atrial fibrillation with RVR shortly after presentation to the ER suffered PEA cardiac arrest times ROSC estimated at 11 minutes.  Noted to be somewhat difficult intubation due to blood in airway, patient having bit tongue.  Also ER staff reporting concern about possible aspiration of dentures  Interim History / Subjective:   No acute events overnight Continue to have myoclonic jerks Updated son at bedside and wife/daughter via video visit about MRI results showing anoxic brain injury.    Objective   Blood pressure 105/69, pulse (!) 130, temperature 100 F (37.8 C), resp. rate 17, height 5' 8 (1.727 m), weight 72.9 kg, SpO2 95%.    Vent Mode: PRVC FiO2 (%):  [30 %-35 %] 30 % Set Rate:  [16 bmp] 16 bmp Vt Set:  [470 mL] 470 mL PEEP:  [5 cmH20] 5 cmH20 Plateau Pressure:  [16 cmH20-19 cmH20] 17 cmH20   Intake/Output Summary (Last 24 hours) at 01/22/2023 1157 Last data filed at 01/22/2023 1124 Gross per 24 hour  Intake 1772.87 ml  Output 600 ml  Net 1172.87 ml   Filed Weights   01/20/23 0500 01/21/23 0704 01/22/23 0500  Weight: 67.6 kg 69.1 kg 72.9 kg    Examination: General: Critically ill, intubated HENT: ET tube in good position, myoclonic jerks  of mouth/lips Lungs: course breath sounds bilaterally Cardiac rrr Abdomen nondistended with positive bowel sounds Extremities no edema Neuro: Unresponsive.  Still with left facial twitching and jaw movement consistent with myoclonus.  Withdraws to pain bilateral upper and lower extremities.  Does not  follow commands.  No purposeful movement    Resolved Hospital Problem list     Assessment & Plan:  Acute hypoxic respiratory failure Pulmonary Hypertension in setting of extrenal compression of pulmonary artery due to lung cancer COPD with acute exacerbation Presumed contribution of new onset atrial fibrillation to his decompensation, respiratory failure -No evidence of PE on CT chest Plan -PRVC 8 cc/kg -Scheduled bronchodilators Brovana  and Yupelri  -Scheduled Pulmicort  nebs -Continue Solu-Medrol  and taper -Atrial fibrillation management as below  S/p PEA cardiac arrest.  No evidence of pulmonary embolism on CT, question primary cause atrial fibrillation with hemodynamic collapse.   Plan -Echocardiogram with septal dyssynergy, likely impact of his mediastinal/hilar mass -No clear evidence seizures on Cerebell, but possible epileptiform discharges.   -Continue to treat underlying conditions contributors including metabolic disarray.    Circulatory shock status postcardiac arrest.  Suspect at least somewhat due to sedating medications and relative hypovolemia given his response to volume resuscitation, but suspect obstructive shock and impact of mediastinal/hilar mass with cardiogenic component.  Atrial fibrillation also likely a contributor Plan -Wean norepinephrine  as able -Home antihypertensive regimen is on hold (Cozaar , clonidine , metoprolol )  A-fib with RVR.  Suspect this is secondary to his acute illness Plan -Continue amiodarone  -Continue heparin  -Telemetry monitoring  Acute Anoxic Brain Injury Myoclonus -Cerebell with possible epileptiform discharges but no overt seizures.  -Continue empiric Keppra  500 mg twice daily - MRI has confirmed anoxic brain injury, reviewed with family about the devastating injury.   Anemia with bloody oral secretions after biting tongue Current hemoglobin not reflecting bloody oral secretions as was obtained prior to seizure Plan -Follow  for any evidence of recurrent bleeding, follow CBC on heparin   Steroid-induced hyperglycemia Plan Sliding scale insulin  as ordered  Best Practice (right click and Reselect all SmartList Selections daily)   Diet/type: NPO DVT prophylaxis other Pressure ulcer(s): N/A GI prophylaxis: PPI Lines: N/A Foley:  Yes, and it is still needed Code Status:  full code Last date of multidisciplinary goals of care discussion [1/13 discussed patient's status and MRI results with son in room and wife/daughter via video visit. I have recommended transition to comfort care, which they agree with, but are digesting the recent events. They will let us  know when they are ready to transition.]  Labs   CBC: Recent Labs  Lab 01/18/23 1509 01/18/23 1754 01/19/23 1149 01/20/23 0354 01/21/23 0517 01/22/23 0435  WBC 18.8* 17.0* 16.4* 18.9* 14.8* 15.7*  NEUTROABS 15.6*  --   --   --   --   --   HGB 9.3* 8.6* 8.6* 7.3* 7.0* 7.1*  HCT 28.6* 26.4* 26.3* 23.0* 22.4* 22.8*  MCV 97.3 96.0 94.9 97.9 98.7 99.6  PLT 151 132* 158 158 114* 123*    Basic Metabolic Panel: Recent Labs  Lab 01/18/23 1130 01/19/23 1149 01/20/23 0354 01/21/23 0517 01/22/23 0435  NA 135 134* 136 138 131*  K 4.7 3.6 3.7 3.9 3.3*  CL 103 105 107 110 100  CO2 20* 21* 21* 21* 19*  GLUCOSE 82 162* 209* 123* 416*  BUN 14 20 24* 27* 29*  CREATININE 0.99 0.82 0.78 1.06 1.02  CALCIUM  10.2 9.4 9.0 9.2 8.8*  MG  --  1.7  --   --  2.1  PHOS  --  4.0  --   --  2.7   GFR: Estimated Creatinine Clearance: 66.1 mL/min (by C-G formula based on SCr of 1.02 mg/dL). Recent Labs  Lab 01/18/23 1754 01/19/23 1149 01/20/23 0354 01/21/23 0517 01/22/23 0435  PROCALCITON 0.56 2.71 2.21 1.16  --   WBC 17.0* 16.4* 18.9* 14.8* 15.7*  LATICACIDVEN 3.8*  --   --   --   --     Liver Function Tests: Recent Labs  Lab 01/20/23 0354 01/21/23 0517  AST 47* 24  ALT 92* 67*  ALKPHOS 57 56  BILITOT 0.6 0.4  PROT 5.8* 5.5*  ALBUMIN 2.2* 2.0*    No results for input(s): LIPASE, AMYLASE in the last 168 hours. No results for input(s): AMMONIA in the last 168 hours.  ABG    Component Value Date/Time   PHART 7.43 01/20/2023 0326   PCO2ART 30 (L) 01/20/2023 0326   PO2ART 102 01/20/2023 0326   HCO3 20.1 01/20/2023 0326   ACIDBASEDEF 3.4 (H) 01/20/2023 0326   O2SAT 99.7 01/20/2023 0326     Coagulation Profile: No results for input(s): INR, PROTIME in the last 168 hours.  Cardiac Enzymes: No results for input(s): CKTOTAL, CKMB, CKMBINDEX, TROPONINI in the last 168 hours.  HbA1C: Hgb A1c MFr Bld  Date/Time Value Ref Range Status  01/18/2023 05:54 PM 6.3 (H) 4.8 - 5.6 % Final    Comment:    (NOTE) Pre diabetes:          5.7%-6.4%  Diabetes:              >6.4%  Glycemic control for   <7.0% adults with diabetes     CBG: Recent Labs  Lab 01/22/23 0044 01/22/23 0451 01/22/23 0750 01/22/23 1012 01/22/23 1152  GLUCAP 135* 81 75 85 111*     Critical care time: 35 min      Dorn Chill, MD [facility] Office: [phone]   See Amion for personal pager PCCM on call pager [phone] until 7pm. Please call Elink 7p-7a. [phone]

## 2023-01-22 NOTE — Inpatient Diabetes Management (Signed)
 Inpatient Diabetes Program Recommendations  AACE/ADA: New Consensus Statement on Inpatient Glycemic Control (2015)  Target Ranges:  Prepandial:   less than 140 mg/dL      Peak postprandial:   less than 180 mg/dL (1-2 hours)      Critically ill patients:  140 - 180 mg/dL   Lab Results  Component Value Date   GLUCAP 75 01/22/2023   HGBA1C 6.3 (H) 01/18/2023    Review of Glycemic Control  Latest Reference Range & Units 01/21/23 12:53 01/21/23 15:57 01/21/23 19:57 01/22/23 00:16 01/22/23 00:44 01/22/23 04:51 01/22/23 07:50  Glucose-Capillary 70 - 99 mg/dL 872 (H)  Novolog  3 units given 114 (H) 75 49 (L) 135 (H) 81 75   Diabetes history: None recent and current steroid use  Current orders for Inpatient glycemic control:  Novolog  0-20 units Q4  Solumedrol 40 mg Daily A1c 6.3% on 1/10, however Hgb is low around 7  Inpatient Diabetes Program Recommendations:    Note: hypoglycemia  -   Reduce Novolog  Correction scale to 0-9 Q4 hours  Thanks,  Clotilda Bull RN, MSN, BC-ADM Inpatient Diabetes Coordinator Team Pager [phone] (8a-5p)

## 2023-01-22 NOTE — Progress Notes (Signed)
 Complex Care Management Note Care Guide Note  01/22/2023 Name: Scott Lindsey MRN: [id number] DOB: [birth year]  Scott Lindsey is a 68 y.o. year old male who is a primary care patient of Joshua Debby CROME, MD . The community resource team was consulted for assistance with Transportation Needs   SDOH screenings and interventions completed:  Yes     Care guide performed the following interventions: Received return call from patient's spouse she no longer needs transportation assistance for patient.  Follow Up Plan:  No further follow up planned at this time. The patient has been provided with needed resources.  Encounter Outcome:  Patient Visit Completed  Anuj Summons [facility]  [facility], [facility] Guide Direct Dial: [phone]  Website: [email or web address]

## 2023-01-22 NOTE — Progress Notes (Signed)
 Hypoglycemic Event  CBG: 49   Treatment: D50 50 mL (25 gm)  Symptoms: None  Follow-up CBG: Time:0044 CBG Result:135  Possible Reasons for Event: Inadequate meal intake  Comments/MD notified:hypoglycemic protocol followed     Scott Lindsey

## 2023-01-23 ENCOUNTER — Ambulatory Visit: Payer: Medicare Other

## 2023-01-23 DIAGNOSIS — I469 Cardiac arrest, cause unspecified: Secondary | ICD-10-CM | POA: Diagnosis not present

## 2023-01-23 LAB — GLUCOSE, CAPILLARY
Glucose-Capillary: 118 mg/dL — ABNORMAL HIGH (ref 70–99)
Glucose-Capillary: 96 mg/dL (ref 70–99)
Glucose-Capillary: 103 mg/dL — ABNORMAL HIGH (ref 70–99)
Glucose-Capillary: 104 mg/dL — ABNORMAL HIGH (ref 70–99)
Glucose-Capillary: 116 mg/dL — ABNORMAL HIGH (ref 70–99)
Glucose-Capillary: 118 mg/dL — ABNORMAL HIGH (ref 70–99)

## 2023-01-23 LAB — MAGNESIUM: Magnesium: 2.2 mg/dL (ref 1.7–2.4)

## 2023-01-23 LAB — TRIGLYCERIDES: Triglycerides: 250 mg/dL — ABNORMAL HIGH (ref <150)

## 2023-01-23 LAB — BASIC METABOLIC PANEL
Anion gap: 8 (ref 5–15)
BUN: 41 mg/dL — ABNORMAL HIGH (ref 8–23)
CO2: 19 mmol/L — ABNORMAL LOW (ref 22–32)
Calcium: 9.7 mg/dL (ref 8.9–10.3)
Chloride: 111 mmol/L (ref 98–111)
Creatinine, Ser: 1.14 mg/dL (ref 0.61–1.24)
GFR, Estimated: >60 mL/min (ref >60)
Glucose, Bld: 101 mg/dL — ABNORMAL HIGH (ref 70–99)
Potassium: 4 mmol/L (ref 3.5–5.1)
Sodium: 138 mmol/L (ref 135–145)

## 2023-01-23 LAB — CBC
HCT: 23 % — ABNORMAL LOW (ref 39.0–52.0)
Hemoglobin: 7.1 g/dL — ABNORMAL LOW (ref 13.0–17.0)
MCH: 30.9 pg (ref 26.0–34.0)
MCHC: 30.9 g/dL (ref 30.0–36.0)
MCV: 100 fL (ref 80.0–100.0)
Platelets: 121 10*3/uL — ABNORMAL LOW (ref 150–400)
RBC: 2.3 MIL/uL — ABNORMAL LOW (ref 4.22–5.81)
RDW: 16.4 % — ABNORMAL HIGH (ref 11.5–15.5)
WBC: 16 10*3/uL — ABNORMAL HIGH (ref 4.0–10.5)
nRBC: 1.1 % — ABNORMAL HIGH (ref 0.0–0.2)

## 2023-01-23 LAB — PHOSPHORUS: Phosphorus: 3 mg/dL (ref 2.5–4.6)

## 2023-01-23 LAB — HEPARIN LEVEL (UNFRACTIONATED): Heparin Unfractionated: 0.69 [IU]/mL (ref 0.30–0.70)

## 2023-01-23 MED ORDER — PREDNISONE 20 MG PO TABS
30.0000 mg | ORAL_TABLET | Freq: Every day | ORAL | Status: AC
  Administered 2023-01-23 – 2023-01-25 (×3): 30 mg
  Filled 2023-01-23 (×3): qty 1

## 2023-01-23 MED ORDER — PREDNISONE 10 MG PO TABS: 10.0000 mg | ORAL_TABLET | Freq: Every day | ORAL | Status: DC

## 2023-01-23 MED ORDER — SODIUM CHLORIDE 0.9 % IV SOLN
2.0000 g | Freq: Two times a day (BID) | INTRAVENOUS | Status: DC
  Administered 2023-01-23 – 2023-01-26 (×7): 2 g via INTRAVENOUS
  Filled 2023-01-23 (×7): qty 12.5

## 2023-01-23 MED ORDER — PREDNISONE 20 MG PO TABS
20.0000 mg | ORAL_TABLET | Freq: Every day | ORAL | Status: DC
  Administered 2023-01-26: 20 mg
  Filled 2023-01-23: qty 1

## 2023-01-23 NOTE — Plan of Care (Signed)
  Problem: Skin Integrity: Goal: Risk for impaired skin integrity will decrease Outcome: Progressing   Problem: Tissue Perfusion: Goal: Adequacy of tissue perfusion will improve Outcome: Progressing   Problem: Health Behavior/Discharge Planning: Goal: Ability to identify and utilize available resources and services will improve Outcome: Not Progressing Goal: Ability to manage health-related needs will improve Outcome: Not Progressing   Problem: Metabolic: Goal: Ability to maintain appropriate glucose levels will improve Outcome: Not Progressing   Problem: Nutritional: Goal: Maintenance of adequate nutrition will improve Outcome: Not Progressing Goal: Progress toward achieving an optimal weight will improve Outcome: Not Progressing   Problem: Activity: Goal: Ability to tolerate increased activity will improve Outcome: Not Progressing

## 2023-01-23 NOTE — Progress Notes (Signed)
 PHARMACY - ANTICOAGULATION CONSULT NOTE  Pharmacy Consult for Heparin  Indication: atrial fibrillation  No Known Allergies  Patient Measurements: Height: 5\' 8"  (172.7 cm) Weight: 72.9 kg (160 lb 11.5 oz) IBW/kg (Calculated) : 68.4 Heparin  Dosing Weight: TBW  Vital Signs: Temp: 98.2 F (36.8 C) (01/15 0600) BP: 102/47 (01/15 0600) Pulse Rate: 54 (01/15 0600)  Labs: Recent Labs    01/21/23 0517 01/22/23 0435 01/23/23 0457  HGB 7.0* 7.1* 7.1*  HCT 22.4* 22.8* 23.0*  PLT 114* 123* 121*  HEPARINUNFRC 0.55 0.53 0.69  CREATININE 1.06 1.02 1.14    Estimated Creatinine Clearance: 59.2 mL/min (by C-G formula based on SCr of 1.14 mg/dL).    Assessment: 27 yoM presented to ED on 1/10 with fatigue, chest tightness, and SOB. Noted to be in Afib here and during a recent admission. Pharmacy consulted to start heparin  infusion for Afib. PMH significant for COPD, EtOH use, HTN, symptomatic PVCs, new Dx SCC of R lung.  No prior anticoagulation.     Today, 01/23/23: HL 0.69 - remains therapeutic on 750 units/hr Hgb/plt low but stable     Goal of Therapy:  Heparin  level 0.3-0.7 units/ml Monitor platelets by anticoagulation protocol: Yes   Plan:  -Continue heparin  infusion at 750 units/hr -Daily heparin  level and CBC while on infusion     Lolita Rise, PharmD, BCPS Clinical Pharmacist 01/23/2023 7:12 AM

## 2023-01-23 NOTE — Progress Notes (Signed)
 NAME:  Scott Lindsey, MRN:  [id number], DOB:  07-Aug-1955, LOS: 5 ADMISSION DATE:  01/18/2023, CONSULTATION DATE: 1/10 REFERRING MD: Martina Sledge, CHIEF COMPLAINT: Cardiac arrest  History of Present Illness:  68 year old male patient with history of COPD, pulmonary fibrosis, ECOG 1 with new Dx metastatic squamous cell carcinoma presumed to be primary lung with mets to liver and lymph nodes Just discharged from the hospital on 1/8 after being treated for pneumonia and COPD exacerbation and receiving the new diagnosis of lung cancer.  Has yet to start therapy. Presented to the [facility] emergency room on 1/10 with chief complaint worsening weakness, fatigue, and gasping for air this morning not able to tolerate really any activity.  EMS was called he was found to be in new A-fib/RVR with heart rate in the 120s to 190s, he was hypoxic and found to have pulse oximetry in the 70s with marked increased work of breathing. While in the ER he remained hypoxic, while staff was working on support patient had sudden episode of what appeared to be seizure, he bit his tongue, shortly after this suffered a PEA cardiac arrest.  Time to return of spontaneous circulation estimated at 11 minutes with ongoing ACLS.  On critical care arrival the patient was hypotensive following 1 L of crystalloid, still continued to demonstrate agonal respiratory efforts on the ventilator.  There were bloody endotracheal tube secretions, patient on propofol , withdrawing to pain in all extremities ER evaluation at time of admission HCO3 20 COVID-negative normal renal function, initial chest x-ray with previously noted right lung base opacity and bibasilar airspace disease  Pertinent  Medical History  COPD, pulmonary fibrosis, prior alcoholic pancreatitis, history of remote alcohol  abuse, history of prostate cancer, hypertension, HLD, newly diagnosed metastatic squamous cell carcinoma felt lung primary site with metastasis to liver and lymph  nodes  Significant Hospital Events: Including procedures, antibiotic start and stop dates in addition to other pertinent events   1/10 just discharged on the eighth following admission for pneumonia, COPD exacerbation, and new diagnosis of squamous cell carcinoma, felt pulmonary as primary with metastasis to lymph nodes and liver presents acutely hypoxic with pulse oximetry in the 70s, marked work of breathing gasping for air, and new atrial fibrillation with RVR shortly after presentation to the ER suffered PEA cardiac arrest times ROSC estimated at 11 minutes.  Noted to be somewhat difficult intubation due to blood in airway, patient having bit tongue.  Also ER staff reporting concern about possible aspiration of dentures  Interim History / Subjective:   No acute events overnight No family at bedside.   Objective   Blood pressure (!) 102/47, pulse (!) 54, temperature 98.2 F (36.8 C), resp. rate (!) 0, height 5\' 8"  (1.727 m), weight 72.9 kg, SpO2 94%.    Vent Mode: PRVC FiO2 (%):  [30 %] 30 % Set Rate:  [16 bmp] 16 bmp Vt Set:  [470 mL] 470 mL PEEP:  [5 cmH20] 5 cmH20 Plateau Pressure:  [16 cmH20-19 cmH20] 16 cmH20   Intake/Output Summary (Last 24 hours) at 01/23/2023 0738 Last data filed at 01/23/2023 0342 Gross per 24 hour  Intake 1608.77 ml  Output 825 ml  Net 783.77 ml   Filed Weights   01/20/23 0500 01/21/23 0704 01/22/23 0500  Weight: 67.6 kg 69.1 kg 72.9 kg   Examination: General: Critically ill, intubated HENT: ET tube in good position Lungs: course breath sounds bilaterally Cardiac rrr Abdomen nondistended with positive bowel sounds Extremities no edema Neuro: sedated, no  signs of myoclonic jerks on sedation at this time  Resolved Hospital Problem list     Assessment & Plan:  Acute hypoxic respiratory failure Pulmonary Hypertension in setting of extrenal compression of pulmonary artery due to lung cancer COPD with acute exacerbation Presumed contribution of  new onset atrial fibrillation to his decompensation, respiratory failure -No evidence of PE on CT chest Plan -PRVC 8 cc/kg -Scheduled bronchodilators Brovana  and Yupelri  -Scheduled Pulmicort  nebs -prednisone  taper -Atrial fibrillation management as below  S/p PEA cardiac arrest.  No evidence of pulmonary embolism on CT, question primary cause atrial fibrillation with hemodynamic collapse.   Plan -Echocardiogram with septal dyssynergy, likely impact of his mediastinal/hilar mass -No clear evidence seizures on Cerebell, but possible epileptiform discharges.   -Continue to treat underlying conditions contributors including metabolic disarray.    Circulatory shock status postcardiac arrest.  Suspect due to sedating medications and  but initially due to obstructive shock and impact of mediastinal/hilar mass with cardiogenic component.  Atrial fibrillation also likely a contributor Plan -Wean norepinephrine  as able -Home antihypertensive regimen is on hold (Cozaar , clonidine , metoprolol )  A-fib with RVR.  Suspect this is secondary to his acute illness Plan -Continue amiodarone  -Continue heparin  -Telemetry monitoring  Acute Anoxic Brain Injury Myoclonus -Cerebell with possible epileptiform discharges but no overt seizures.  -Continue empiric Keppra  500 mg twice daily - MRI has confirmed anoxic brain injury, reviewed with family about the devastating injury.   Anemia with bloody oral secretions after biting tongue Current hemoglobin not reflecting bloody oral secretions as was obtained prior to seizure Plan -Follow for any evidence of recurrent bleeding, follow CBC on heparin   Steroid-induced hyperglycemia Plan Sliding scale insulin  as ordered  Best Practice (right click and "Reselect all SmartList Selections" daily)   Diet/type: NPO DVT prophylaxis other Pressure ulcer(s): N/A GI prophylaxis: PPI Lines: N/A Foley:  Yes, and it is still needed Code Status:  full code Last  date of multidisciplinary goals of care discussion [1/13 discussed patient's status and MRI results with son in room and wife/daughter via video visit. I have recommended transition to comfort care, which they agree with, but are digesting the recent events. They will let us  know when they are ready to transition.]  Labs   CBC: Recent Labs  Lab 01/18/23 1509 01/18/23 1754 01/19/23 1149 01/20/23 0354 01/21/23 0517 01/22/23 0435 01/23/23 0457  WBC 18.8*   < > 16.4* 18.9* 14.8* 15.7* 16.0*  NEUTROABS 15.6*  --   --   --   --   --   --   HGB 9.3*   < > 8.6* 7.3* 7.0* 7.1* 7.1*  HCT 28.6*   < > 26.3* 23.0* 22.4* 22.8* 23.0*  MCV 97.3   < > 94.9 97.9 98.7 99.6 100.0  PLT 151   < > 158 158 114* 123* 121*   < > = values in this interval not displayed.    Basic Metabolic Panel: Recent Labs  Lab 01/19/23 1149 01/20/23 0354 01/21/23 0517 01/22/23 0435 01/23/23 0457  NA 134* 136 138 131* 138  K 3.6 3.7 3.9 3.3* 4.0  CL 105 107 110 100 111  CO2 21* 21* 21* 19* 19*  GLUCOSE 162* 209* 123* 416* 101*  BUN 20 24* 27* 29* 41*  CREATININE 0.82 0.78 1.06 1.02 1.14  CALCIUM  9.4 9.0 9.2 8.8* 9.7  MG 1.7  --   --  2.1 2.2  PHOS 4.0  --   --  2.7 3.0   GFR: Estimated Creatinine  Clearance: 59.2 mL/min (by C-G formula based on SCr of 1.14 mg/dL). Recent Labs  Lab 01/18/23 1754 01/19/23 1149 01/20/23 0354 01/21/23 0517 01/22/23 0435 01/23/23 0457  PROCALCITON 0.56 2.71 2.21 1.16  --   --   WBC 17.0* 16.4* 18.9* 14.8* 15.7* 16.0*  LATICACIDVEN 3.8*  --   --   --   --   --     Liver Function Tests: Recent Labs  Lab 01/20/23 0354 01/21/23 0517  AST 47* 24  ALT 92* 67*  ALKPHOS 57 56  BILITOT 0.6 0.4  PROT 5.8* 5.5*  ALBUMIN 2.2* 2.0*   No results for input(s): "LIPASE", "AMYLASE" in the last 168 hours. No results for input(s): "AMMONIA" in the last 168 hours.  ABG    Component Value Date/Time   PHART 7.43 01/20/2023 0326   PCO2ART 30 (L) 01/20/2023 0326   PO2ART 102  01/20/2023 0326   HCO3 20.1 01/20/2023 0326   ACIDBASEDEF 3.4 (H) 01/20/2023 0326   O2SAT 99.7 01/20/2023 0326     Coagulation Profile: No results for input(s): "INR", "PROTIME" in the last 168 hours.  Cardiac Enzymes: No results for input(s): "CKTOTAL", "CKMB", "CKMBINDEX", "TROPONINI" in the last 168 hours.  HbA1C: Hgb A1c MFr Bld  Date/Time Value Ref Range Status  01/18/2023 05:54 PM 6.3 (H) 4.8 - 5.6 % Final    Comment:    (NOTE) Pre diabetes:          5.7%-6.4%  Diabetes:              >6.4%  Glycemic control for   <7.0% adults with diabetes     CBG: Recent Labs  Lab 01/22/23 1012 01/22/23 1152 01/22/23 1554 01/22/23 1948 01/22/23 2303  GLUCAP 85 111* 120* 80 118*     Critical care time: 32 min      Duaine German, MD [facility] Office: [phone]   See Amion for personal pager PCCM on call pager [phone] until 7pm. Please call Elink 7p-7a. [phone]

## 2023-01-23 NOTE — Progress Notes (Signed)
 PCCM Update:  Spoke with patient's wife Alethea Hutchinson to check in. She is planning to come in Saturday for transition to comfort care as she is talking with family and still grasping the current situation.   Duaine German, MD [facility] Office: [phone]   See Amion for personal pager PCCM on call pager [phone] until 7pm. Please call Elink 7p-7a. [phone]

## 2023-01-23 NOTE — Progress Notes (Signed)
 PHARMACY NOTE:  ANTIMICROBIAL RENAL DOSAGE ADJUSTMENT  Current antimicrobial regimen includes a mismatch between antimicrobial dosage and estimated renal function.  As per policy approved by the Pharmacy & Therapeutics and Medical Executive Committees, the antimicrobial dosage will be adjusted accordingly.  Current antimicrobial dosage: cefepime  2 g IV q8h  Indication: aspiration pneumonia/COPD exacerbation   Renal Function:  Estimated Creatinine Clearance: 59.2 mL/min (by C-G formula based on SCr of 1.14 mg/dL).    Antimicrobial dosage has been changed to: cefepime  2 g IV q12h   Thank you for allowing pharmacy to be a part of this patient's care.  Lolita Rise, PharmD, BCPS Clinical Pharmacist 01/23/2023 7:24 AM

## 2023-01-24 ENCOUNTER — Other Ambulatory Visit: Payer: Self-pay | Admitting: *Deleted

## 2023-01-24 ENCOUNTER — Ambulatory Visit: Payer: Medicare Other

## 2023-01-24 DIAGNOSIS — I469 Cardiac arrest, cause unspecified: Secondary | ICD-10-CM | POA: Diagnosis not present

## 2023-01-24 LAB — BASIC METABOLIC PANEL
Anion gap: 8 (ref 5–15)
BUN: 41 mg/dL — ABNORMAL HIGH (ref 8–23)
CO2: 20 mmol/L — ABNORMAL LOW (ref 22–32)
Calcium: 9.8 mg/dL (ref 8.9–10.3)
Chloride: 108 mmol/L (ref 98–111)
Creatinine, Ser: 1.17 mg/dL (ref 0.61–1.24)
GFR, Estimated: >60 mL/min (ref >60)
Glucose, Bld: 103 mg/dL — ABNORMAL HIGH (ref 70–99)
Potassium: 3.9 mmol/L (ref 3.5–5.1)
Sodium: 136 mmol/L (ref 135–145)

## 2023-01-24 LAB — CBC
HCT: 23.3 % — ABNORMAL LOW (ref 39.0–52.0)
Hemoglobin: 7.1 g/dL — ABNORMAL LOW (ref 13.0–17.0)
MCH: 30.9 pg (ref 26.0–34.0)
MCHC: 30.5 g/dL (ref 30.0–36.0)
MCV: 101.3 fL — ABNORMAL HIGH (ref 80.0–100.0)
Platelets: 133 10*3/uL — ABNORMAL LOW (ref 150–400)
RBC: 2.3 MIL/uL — ABNORMAL LOW (ref 4.22–5.81)
RDW: 17.1 % — ABNORMAL HIGH (ref 11.5–15.5)
WBC: 14.4 10*3/uL — ABNORMAL HIGH (ref 4.0–10.5)
nRBC: 0.6 % — ABNORMAL HIGH (ref 0.0–0.2)

## 2023-01-24 LAB — GLUCOSE, CAPILLARY
Glucose-Capillary: 110 mg/dL — ABNORMAL HIGH (ref 70–99)
Glucose-Capillary: 140 mg/dL — ABNORMAL HIGH (ref 70–99)
Glucose-Capillary: 142 mg/dL — ABNORMAL HIGH (ref 70–99)
Glucose-Capillary: 155 mg/dL — ABNORMAL HIGH (ref 70–99)
Glucose-Capillary: 86 mg/dL (ref 70–99)
Glucose-Capillary: 94 mg/dL (ref 70–99)
Glucose-Capillary: 97 mg/dL (ref 70–99)

## 2023-01-24 LAB — MAGNESIUM: Magnesium: 2.3 mg/dL (ref 1.7–2.4)

## 2023-01-24 LAB — HEPARIN LEVEL (UNFRACTIONATED): Heparin Unfractionated: 0.67 [IU]/mL (ref 0.30–0.70)

## 2023-01-24 LAB — PHOSPHORUS: Phosphorus: 2.7 mg/dL (ref 2.5–4.6)

## 2023-01-24 MED ORDER — PROSOURCE TF20 ENFIT COMPATIBL EN LIQD
60.0000 mL | Freq: Every day | ENTERAL | Status: DC
  Administered 2023-01-24: 60 mL
  Filled 2023-01-24: qty 60

## 2023-01-24 MED ORDER — PIVOT 1.5 CAL PO LIQD
1000.0000 mL | ORAL | Status: DC
  Filled 2023-01-24: qty 1000

## 2023-01-24 MED ORDER — PIVOT 1.5 CAL PO LIQD
1000.0000 mL | ORAL | Status: DC
  Administered 2023-01-24 – 2023-01-26 (×3): 1000 mL
  Filled 2023-01-24 (×2): qty 1000

## 2023-01-24 MED ORDER — ALTEPLASE 2 MG IJ SOLR
2.0000 mg | Freq: Once | INTRAMUSCULAR | Status: DC
  Filled 2023-01-24: qty 2

## 2023-01-24 MED ORDER — FUROSEMIDE 10 MG/ML IJ SOLN
20.0000 mg | Freq: Once | INTRAMUSCULAR | Status: AC
  Administered 2023-01-24: 20 mg via INTRAVENOUS
  Filled 2023-01-24: qty 2

## 2023-01-24 NOTE — Progress Notes (Signed)
NAME:  Scott Lindsey, MRN:  [id number], DOB:  October 17, 1955, LOS: 6 ADMISSION DATE:  01/18/2023, CONSULTATION DATE: 1/10 REFERRING MD: Wallace Cullens, CHIEF COMPLAINT: Cardiac arrest  History of Present Illness:  68 year old male patient with history of COPD, pulmonary fibrosis, ECOG 1 with new Dx metastatic squamous cell carcinoma presumed to be primary lung with mets to liver and lymph nodes Just discharged from the hospital on 1/8 after being treated for pneumonia and COPD exacerbation and receiving the new diagnosis of lung cancer.  Has yet to start therapy. Presented to the [facility] emergency room on 1/10 with chief complaint worsening weakness, fatigue, and gasping for air this morning not able to tolerate really any activity.  EMS was called he was found to be in new A-fib/RVR with heart rate in the 120s to 190s, he was hypoxic and found to have pulse oximetry in the 70s with marked increased work of breathing. While in the ER he remained hypoxic, while staff was working on support patient had sudden episode of what appeared to be seizure, he bit his tongue, shortly after this suffered a PEA cardiac arrest.  Time to return of spontaneous circulation estimated at 11 minutes with ongoing ACLS.  On critical care arrival the patient was hypotensive following 1 L of crystalloid, still continued to demonstrate agonal respiratory efforts on the ventilator.  There were bloody endotracheal tube secretions, patient on propofol, withdrawing to pain in all extremities ER evaluation at time of admission HCO3 20 COVID-negative normal renal function, initial chest x-ray with previously noted right lung base opacity and bibasilar airspace disease  Pertinent  Medical History  COPD, pulmonary fibrosis, prior alcoholic pancreatitis, history of remote alcohol abuse, history of prostate cancer, hypertension, HLD, newly diagnosed metastatic squamous cell carcinoma felt lung primary site with metastasis to liver and lymph  nodes  Significant Hospital Events: Including procedures, antibiotic start and stop dates in addition to other pertinent events   1/10 just discharged on the eighth following admission for pneumonia, COPD exacerbation, and new diagnosis of squamous cell carcinoma, felt pulmonary as primary with metastasis to lymph nodes and liver presents acutely hypoxic with pulse oximetry in the 70s, marked work of breathing gasping for air, and new atrial fibrillation with RVR shortly after presentation to the ER suffered PEA cardiac arrest times ROSC estimated at 11 minutes.  Noted to be somewhat difficult intubation due to blood in airway, patient having bit tongue.  Also ER staff reporting concern about possible aspiration of dentures  Interim History / Subjective:   No acute events overnight Spoke with wife yesterday with plan to transition to comfort care on Saturday.    Objective   Blood pressure (!) 111/58, pulse 96, temperature 98.6 F (37 C), resp. rate 16, height 5\' 8"  (1.727 m), weight 73.4 kg, SpO2 100%.    Vent Mode: PRVC FiO2 (%):  [30 %-40 %] 40 % Set Rate:  [16 bmp] 16 bmp Vt Set:  [470 mL] 470 mL PEEP:  [5 cmH20] 5 cmH20 Plateau Pressure:  [14 cmH20-19 cmH20] 16 cmH20   Intake/Output Summary (Last 24 hours) at 01/24/2023 0728 Last data filed at 01/24/2023 0425 Gross per 24 hour  Intake 1240.43 ml  Output 930 ml  Net 310.43 ml   Filed Weights   01/21/23 0704 01/22/23 0500 01/24/23 0423  Weight: 69.1 kg 72.9 kg 73.4 kg   Examination: General: Critically ill, intubated HENT: ET tube in good position Lungs: course breath sounds bilaterally Cardiac rrr Abdomen nondistended with  positive bowel sounds Extremities no edema Neuro: sedated, intermittent myoclonic jerks on sedation at this time  Resolved Hospital Problem list     Assessment & Plan:  Acute hypoxic respiratory failure Pulmonary Hypertension in setting of extrenal compression of pulmonary artery due to lung  cancer COPD with acute exacerbation Presumed contribution of new onset atrial fibrillation to his decompensation, respiratory failure -PRVC 8 cc/kg -Scheduled bronchodilators Brovana and Yupelri -Scheduled Pulmicort nebs -prednisone taper -Atrial fibrillation management as below - diuresis today with 20mg  IV lasix  S/p PEA cardiac arrest.   Plan -supportive care  Circulatory shock status postcardiac arrest.  Initially due to obstructive shock and impact of mediastinal/hilar mass with cardiogenic component due to Atrial fibrillation with RVR. On going shock likely due to sedation.  Plan -Wean norepinephrine as able -Home antihypertensive regimen is on hold (Cozaar, clonidine, metoprolol)  A-fib with RVR.  Suspect this is secondary to his acute illness Plan -Continue amiodarone -Continue heparin -Telemetry monitoring  Acute Anoxic Brain Injury Myoclonus -Cerebell with possible epileptiform discharges but no overt seizures.  -Continue empiric Keppra 500 mg twice daily - MRI has confirmed anoxic brain injury, reviewed with family about the devastating injury.  - titrate propofol and versed pushes as needed for myoclonus  Anemia with bloody oral secretions after biting tongue Current hemoglobin not reflecting bloody oral secretions as was obtained prior to seizure Plan -Follow for any evidence of recurrent bleeding, follow CBC on heparin  Steroid-induced hyperglycemia Plan Sliding scale insulin as ordered  Best Practice (right click and "Reselect all SmartList Selections" daily)   Diet/type: tubefeeds DVT prophylaxis systemic heparin Pressure ulcer(s): N/A GI prophylaxis: PPI Lines: Central line and yes and it is still needed Foley:  Yes, and it is still needed Code Status:  full code Last date of multidisciplinary goals of care discussion [1/15 touched base with patient's wife, plan is to transition to comfort care on Saturday. ]  Labs   CBC: Recent Labs  Lab  01/18/23 1509 01/18/23 1754 01/20/23 0354 01/21/23 0517 01/22/23 0435 01/23/23 0457 01/24/23 0408  WBC 18.8*   < > 18.9* 14.8* 15.7* 16.0* 14.4*  NEUTROABS 15.6*  --   --   --   --   --   --   HGB 9.3*   < > 7.3* 7.0* 7.1* 7.1* 7.1*  HCT 28.6*   < > 23.0* 22.4* 22.8* 23.0* 23.3*  MCV 97.3   < > 97.9 98.7 99.6 100.0 101.3*  PLT 151   < > 158 114* 123* 121* 133*   < > = values in this interval not displayed.    Basic Metabolic Panel: Recent Labs  Lab 01/19/23 1149 01/20/23 0354 01/21/23 0517 01/22/23 0435 01/23/23 0457  NA 134* 136 138 131* 138  K 3.6 3.7 3.9 3.3* 4.0  CL 105 107 110 100 111  CO2 21* 21* 21* 19* 19*  GLUCOSE 162* 209* 123* 416* 101*  BUN 20 24* 27* 29* 41*  CREATININE 0.82 0.78 1.06 1.02 1.14  CALCIUM 9.4 9.0 9.2 8.8* 9.7  MG 1.7  --   --  2.1 2.2  PHOS 4.0  --   --  2.7 3.0   GFR: Estimated Creatinine Clearance: 59.2 mL/min (by C-G formula based on SCr of 1.14 mg/dL). Recent Labs  Lab 01/18/23 1754 01/19/23 1149 01/20/23 0354 01/21/23 0517 01/22/23 0435 01/23/23 0457 01/24/23 0408  PROCALCITON 0.56 2.71 2.21 1.16  --   --   --   WBC 17.0* 16.4* 18.9* 14.8*  15.7* 16.0* 14.4*  LATICACIDVEN 3.8*  --   --   --   --   --   --     Liver Function Tests: Recent Labs  Lab 01/20/23 0354 01/21/23 0517  AST 47* 24  ALT 92* 67*  ALKPHOS 57 56  BILITOT 0.6 0.4  PROT 5.8* 5.5*  ALBUMIN 2.2* 2.0*   No results for input(s): "LIPASE", "AMYLASE" in the last 168 hours. No results for input(s): "AMMONIA" in the last 168 hours.  ABG    Component Value Date/Time   PHART 7.43 01/20/2023 0326   PCO2ART 30 (L) 01/20/2023 0326   PO2ART 102 01/20/2023 0326   HCO3 20.1 01/20/2023 0326   ACIDBASEDEF 3.4 (H) 01/20/2023 0326   O2SAT 99.7 01/20/2023 0326     Coagulation Profile: No results for input(s): "INR", "PROTIME" in the last 168 hours.  Cardiac Enzymes: No results for input(s): "CKTOTAL", "CKMB", "CKMBINDEX", "TROPONINI" in the last 168  hours.  HbA1C: Hgb A1c MFr Bld  Date/Time Value Ref Range Status  01/18/2023 05:54 PM 6.3 (H) 4.8 - 5.6 % Final    Comment:    (NOTE) Pre diabetes:          5.7%-6.4%  Diabetes:              >6.4%  Glycemic control for   <7.0% adults with diabetes     CBG: Recent Labs  Lab 01/23/23 1153 01/23/23 1546 01/23/23 1945 01/23/23 2309 01/24/23 0416  GLUCAP 103* 104* 116* 118* 86     Critical care time: 35 min      Melody Comas, MD [facility] Office: [phone]   See Amion for personal pager PCCM on call pager [phone] until 7pm. Please call Elink 7p-7a. [phone]

## 2023-01-24 NOTE — Progress Notes (Signed)
Initial Nutrition Assessment  DOCUMENTATION CODES:   Severe malnutrition in context of chronic illness  INTERVENTION:  - Per CCM, start tube feeds today. Plan for only trickles at this time.  Pivot @ 59mL/hr provides 720 kcals, 45g protein, and free water.  - Monitor magnesium, potassium, and phosphorus BID for at least 3 days, MD to replete as needed,   - If plan to increase tube feeds past trickle, recs as below: Pivot 1.5 at 45 ml/h (1080 ml per day) Provides 1620 kcal, 101 gm protein, 810 ml free water daily   NUTRITION DIAGNOSIS:   Severe Malnutrition related to chronic illness (COPD; newly Dx metastatic squamous cell carcinoma presumed to be primary lung with mets to liver and lymph nodes) as evidenced by severe muscle depletion, percent weight loss (15.4% in 8 months).  GOAL:   Patient will meet greater than or equal to 90% of their needs  MONITOR:   Vent status, Labs, Weight trends, TF tolerance  REASON FOR ASSESSMENT:   Consult Enteral/tube feeding initiation and management  ASSESSMENT:   68 year old male patient with PMH of COPD, pulmonary fibrosis, ECOG 1 with new Dx metastatic squamous cell carcinoma presumed to be primary lung with mets to liver and lymph nodes. Presented with chief complaint worsening weakness, fatigue, and gasping for air this morning not able to tolerate really any activity.   1/10: Admit; s/p cardiac arrest and received ACLS for 11 minutes before ROSC; Intubated 1/12: MRI brain showing anoxic brain injury; MD recommending transition to comfort   Patient is currently intubated on ventilator support MV: 8.7 L/min Temp (24hrs), Avg:97.9 F (36.6 C), Min:96.6 F (35.9 C), Max:99 F (37.2 C)  Received consult to start tube feeds.  Per chart review, patient had a 24# or 15.4% weight loss from April to December, which is significant for a time frame of 8 months.  This RD saw patient during recent hospitalization. At that time patient  reported eating less prior to that admission but was eating 50-100% during admission.   Per CCM, plan to start tube feeds today. Discussed with attending Dr. Francine Graven, plan for only trickle tube feeds at this time.   CCM notes indicate plan for patient to transition to comfort care Saturday, 1/18.   Admit weight: 134# Current weight: 161# I&O's: +11.5L + for deep pitting RUE/LUE edema  Medications reviewed and include: Colace Levophed @ 56mcg/min Propofol @ 16.43mL/hr (provides 436 kcals over 24 hours)  Labs reviewed:  HA1C 6.3 Blood Glucose 86-118 x24 hours   NUTRITION - FOCUSED PHYSICAL EXAM:  Flowsheet Row Most Recent Value  Orbital Region Mild depletion  Upper Arm Region Unable to assess  [deep pitting edema]  Thoracic and Lumbar Region Unable to assess  Buccal Region Unable to assess  Temple Region Moderate depletion  Clavicle Bone Region Unable to assess  [edema]  Clavicle and Acromion Bone Region Unable to assess  [edema]  Scapular Bone Region Unable to assess  Dorsal Hand Unable to assess  [edema]  Patellar Region Severe depletion  Anterior Thigh Region Severe depletion  Posterior Calf Region Severe depletion  Edema (RD Assessment) None  Hair Reviewed  Eyes Unable to assess  Mouth Unable to assess  Skin Reviewed  Nails Reviewed       Diet Order:   Diet Order             Diet NPO time specified  Diet effective now  EDUCATION NEEDS:  Not appropriate for education at this time  Skin:  Skin Assessment: Reviewed RN Assessment  Last BM:  1/15 - type 6  Height:  Ht Readings from Last 1 Encounters:  01/23/23 5\' 8"  (1.727 m)   Weight:  Wt Readings from Last 1 Encounters:  01/24/23 73.4 kg   Ideal Body Weight:  70 kg  BMI:  Body mass index is 24.6 kg/m.  Estimated Nutritional Needs:  Kcal:  1500-1800 kcals Protein:  85-100 grams Fluid:  >/= 1.5L    Shelle Iron RD, LDN Contact via Secure Chat.

## 2023-01-24 NOTE — Consult Note (Signed)
[facility] Liaison Consult Note   01/24/2023  Scott Lindsey 04/04/1955 [id number]  *Acknowledgment of admission - patient currently at ICU level of care   Insurance:  Medicare ACO Reach   Primary Care Provider: Etta Grandchild, MD, with [place] at [facility], this provider is listed for the transition of care follow up appointments  and [facility] calls   [facility] Liaison screened the patient remotely at [facility]. 5 Days ICU level of care on Life support    The patient was screened for 7 and 30 day readmission hospitalization with noted extreme risk score for unplanned readmission risk 2 hospital admissions in 6 months. Patient is currently on life support, reviewed for progress.  Review reveals patient was previously active with [facility] team for referral for transportation needs noted from previous admission noted.   Plan: [facility] Liaison will continue to follow progress and disposition to asess for post hospital community care coordination/management needs.  Referral request for community care coordination: following  [facility] Community Care, Population Health does not replace or interfere with any arrangements made by the Inpatient Transition of Care team.   For questions contact:   Charlesetta Shanks, RN, BSN, CCM [place]  [facility], [facility] Liaison Direct Dial: [phone] or secure chat Email: [email or web address]

## 2023-01-24 NOTE — Plan of Care (Signed)
  Problem: Coping: Goal: Ability to adjust to condition or change in health will improve Outcome: Not Progressing   Problem: Fluid Volume: Goal: Ability to maintain a balanced intake and output will improve Outcome: Not Progressing   Problem: Health Behavior/Discharge Planning: Goal: Ability to manage health-related needs will improve Outcome: Not Progressing   Problem: Activity: Goal: Ability to tolerate increased activity will improve Outcome: Not Progressing   Problem: Respiratory: Goal: Ability to maintain a clear airway and adequate ventilation will improve Outcome: Not Progressing   Problem: Education: Goal: Knowledge of General Education information will improve Description: Including pain rating scale, medication(s)/side effects and non-pharmacologic comfort measures Outcome: Not Progressing

## 2023-01-24 NOTE — Progress Notes (Addendum)
Magnesium and phosphorous collection scheduled for 1700 delayed due to insufficient blood return from PICC. Repositioning of extremity attempted, caps exchanged, and power/pulsatile flushing attempted without success. IV team consult placed, CCM notified  1902: PICC line assessed by IV team, blood return remains scant. Per Joneen Roach, NP, plan to TPA PICC one lumen at a time in order to facilitate continuous infusions. PICC line still flushes appropriately with blood return, volume however is insufficient to collect labs.

## 2023-01-24 NOTE — Progress Notes (Signed)
PHARMACY - ANTICOAGULATION CONSULT NOTE  Pharmacy Consult for Heparin Indication: atrial fibrillation  No Known Allergies  Patient Measurements: Height: 5\' 8"  (172.7 cm) Weight: 73.4 kg (161 lb 13.1 oz) IBW/kg (Calculated) : 68.4 Heparin Dosing Weight: TBW  Vital Signs: Temp: 98.6 F (37 C) (01/16 0645) Temp Source: Esophageal (01/16 0400) BP: 111/58 (01/16 0645) Pulse Rate: 96 (01/16 0645)  Labs: Recent Labs    01/22/23 0435 01/23/23 0457 01/24/23 0408  HGB 7.1* 7.1* 7.1*  HCT 22.8* 23.0* 23.3*  PLT 123* 121* 133*  HEPARINUNFRC 0.53 0.69 0.67  CREATININE 1.02 1.14  --     Estimated Creatinine Clearance: 59.2 mL/min (by C-G formula based on SCr of 1.14 mg/dL).    Assessment: 34 yoM presented to ED on 1/10 with fatigue, chest tightness, and SOB. Noted to be in Afib here and during a recent admission. Pharmacy consulted to start heparin infusion for Afib. PMH significant for COPD, EtOH use, HTN, symptomatic PVCs, new Dx SCC of R lung.  No prior anticoagulation.     Today, 01/24/23: HL 0.67 - remains therapeutic on 750 units/hr Hgb/plt low but stable     Goal of Therapy:  Heparin level 0.3-0.7 units/ml Monitor platelets by anticoagulation protocol: Yes   Plan:  -Continue heparin infusion at 750 units/hr -Daily heparin level and CBC while on infusion     Pricilla Riffle, PharmD, BCPS Clinical Pharmacist 01/24/2023 7:08 AM

## 2023-01-24 NOTE — Plan of Care (Signed)
  Problem: Education: Goal: Ability to describe self-care measures that may prevent or decrease complications (Diabetes Survival Skills Education) will improve Outcome: Progressing Goal: Individualized Educational Video(s) Outcome: Progressing   Problem: Coping: Goal: Ability to adjust to condition or change in health will improve Outcome: Progressing   Problem: Fluid Volume: Goal: Ability to maintain a balanced intake and output will improve Outcome: Progressing   Problem: Health Behavior/Discharge Planning: Goal: Ability to identify and utilize available resources and services will improve Outcome: Progressing Goal: Ability to manage health-related needs will improve Outcome: Progressing   Problem: Metabolic: Goal: Ability to maintain appropriate glucose levels will improve Outcome: Progressing   Problem: Nutritional: Goal: Maintenance of adequate nutrition will improve Outcome: Progressing Goal: Progress toward achieving an optimal weight will improve Outcome: Progressing   Problem: Skin Integrity: Goal: Risk for impaired skin integrity will decrease Outcome: Progressing   Problem: Tissue Perfusion: Goal: Adequacy of tissue perfusion will improve Outcome: Progressing   Problem: Activity: Goal: Ability to tolerate increased activity will improve Outcome: Progressing   Problem: Respiratory: Goal: Ability to maintain a clear airway and adequate ventilation will improve Outcome: Progressing   Problem: Education: Goal: Knowledge of General Education information will improve Description: Including pain rating scale, medication(s)/side effects and non-pharmacologic comfort measures Outcome: Progressing   Problem: Health Behavior/Discharge Planning: Goal: Ability to manage health-related needs will improve Outcome: Progressing   Problem: Clinical Measurements: Goal: Ability to maintain clinical measurements within normal limits will improve Outcome:  Progressing Goal: Will remain free from infection Outcome: Progressing Goal: Diagnostic test results will improve Outcome: Progressing Goal: Respiratory complications will improve Outcome: Progressing Goal: Cardiovascular complication will be avoided Outcome: Progressing   Problem: Activity: Goal: Risk for activity intolerance will decrease Outcome: Progressing   Problem: Nutrition: Goal: Adequate nutrition will be maintained Outcome: Progressing   Problem: Coping: Goal: Level of anxiety will decrease Outcome: Progressing   Problem: Elimination: Goal: Will not experience complications related to bowel motility Outcome: Progressing Goal: Will not experience complications related to urinary retention Outcome: Progressing   Problem: Pain Management: Goal: General experience of comfort will improve Outcome: Progressing   Problem: Safety: Goal: Ability to remain free from injury will improve Outcome: Progressing   Problem: Skin Integrity: Goal: Risk for impaired skin integrity will decrease Outcome: Progressing

## 2023-01-25 ENCOUNTER — Ambulatory Visit: Payer: Medicare Other

## 2023-01-25 DIAGNOSIS — I469 Cardiac arrest, cause unspecified: Secondary | ICD-10-CM | POA: Diagnosis not present

## 2023-01-25 LAB — GLUCOSE, CAPILLARY
Glucose-Capillary: 116 mg/dL — ABNORMAL HIGH (ref 70–99)
Glucose-Capillary: 190 mg/dL — ABNORMAL HIGH (ref 70–99)
Glucose-Capillary: 30 mg/dL — CL (ref 70–99)
Glucose-Capillary: 280 mg/dL — ABNORMAL HIGH (ref 70–99)
Glucose-Capillary: >600 mg/dL (ref 70–99)
Glucose-Capillary: >600 mg/dL (ref 70–99)
Glucose-Capillary: 99 mg/dL (ref 70–99)
Glucose-Capillary: 116 mg/dL — ABNORMAL HIGH (ref 70–99)
Glucose-Capillary: 74 mg/dL (ref 70–99)

## 2023-01-25 LAB — CBC
HCT: 23 % — ABNORMAL LOW (ref 39.0–52.0)
Hemoglobin: 7.4 g/dL — ABNORMAL LOW (ref 13.0–17.0)
MCH: 32.5 pg (ref 26.0–34.0)
MCHC: 32.2 g/dL (ref 30.0–36.0)
MCV: 100.9 fL — ABNORMAL HIGH (ref 80.0–100.0)
Platelets: 143 10*3/uL — ABNORMAL LOW (ref 150–400)
RBC: 2.28 MIL/uL — ABNORMAL LOW (ref 4.22–5.81)
RDW: 17.2 % — ABNORMAL HIGH (ref 11.5–15.5)
WBC: 14.8 10*3/uL — ABNORMAL HIGH (ref 4.0–10.5)
nRBC: 0.4 % — ABNORMAL HIGH (ref 0.0–0.2)

## 2023-01-25 LAB — MAGNESIUM
Magnesium: 2.2 mg/dL (ref 1.7–2.4)
Magnesium: 2.1 mg/dL (ref 1.7–2.4)

## 2023-01-25 LAB — BASIC METABOLIC PANEL WITH GFR
Anion gap: 16 — ABNORMAL HIGH (ref 5–15)
BUN: 45 mg/dL — ABNORMAL HIGH (ref 8–23)
CO2: 12 mmol/L — ABNORMAL LOW (ref 22–32)
Calcium: 8.2 mg/dL — ABNORMAL LOW (ref 8.9–10.3)
Chloride: 108 mmol/L (ref 98–111)
Creatinine, Ser: 1.12 mg/dL (ref 0.61–1.24)
GFR, Estimated: >60 mL/min
Glucose, Bld: 134 mg/dL — ABNORMAL HIGH (ref 70–99)
Potassium: 4.1 mmol/L (ref 3.5–5.1)
Sodium: 136 mmol/L (ref 135–145)

## 2023-01-25 LAB — PHOSPHORUS
Phosphorus: 3 mg/dL (ref 2.5–4.6)
Phosphorus: 3.1 mg/dL (ref 2.5–4.6)

## 2023-01-25 LAB — HEPARIN LEVEL (UNFRACTIONATED): Heparin Unfractionated: 0.55 [IU]/mL (ref 0.30–0.70)

## 2023-01-25 MED ORDER — SODIUM CHLORIDE 0.9 % IV SOLN: INTRAVENOUS | Status: DC | PRN

## 2023-01-25 MED ORDER — SODIUM CHLORIDE 3 % IN NEBU
4.0000 mL | INHALATION_SOLUTION | Freq: Two times a day (BID) | RESPIRATORY_TRACT | Status: DC
  Administered 2023-01-25 – 2023-01-26 (×3): 4 mL via RESPIRATORY_TRACT
  Filled 2023-01-25 (×4): qty 4

## 2023-01-25 NOTE — Progress Notes (Signed)
PHARMACY - ANTICOAGULATION CONSULT NOTE  Pharmacy Consult for Heparin Indication: atrial fibrillation  No Known Allergies  Patient Measurements: Height: 5\' 8"  (172.7 cm) Weight: 72.8 kg (160 lb 7.9 oz) IBW/kg (Calculated) : 68.4 Heparin Dosing Weight: TBW  Vital Signs: Temp: 97.2 F (36.2 C) (01/17 0615) Temp Source: Esophageal (01/16 2230) BP: 99/62 (01/17 0615) Pulse Rate: 98 (01/17 0615)  Labs: Recent Labs    01/23/23 0457 01/24/23 0408 01/24/23 0812 01/25/23 0402  HGB 7.1* 7.1*  --  7.4*  HCT 23.0* 23.3*  --  23.0*  PLT 121* 133*  --  143*  HEPARINUNFRC 0.69 0.67  --  0.55  CREATININE 1.14  --  1.17  --     Estimated Creatinine Clearance: 57.6 mL/min (by C-G formula based on SCr of 1.17 mg/dL).    Assessment: 49 yoM presented to ED on 1/10 with fatigue, chest tightness, and SOB. Noted to be in Afib here and during a recent admission. Pharmacy consulted to start heparin infusion for Afib. PMH significant for COPD, EtOH use, HTN, symptomatic PVCs, new Dx SCC of R lung; no prior anticoagulation. Pharmacy to dose IV heparin.  Today, 01/25/23: HL remains therapeutic and stable on 750 units/hr Hgb/plt low but stable SCr remains WNL but trending slowly upward     Goal of Therapy:  Heparin level 0.3-0.7 units/ml Monitor platelets by anticoagulation protocol: Yes   Plan:  Continue heparin infusion at 750 units/hr Daily heparin level and CBC while on infusion  Kol Consuegra A, PharmD, BCPS Clinical Pharmacist 01/25/2023 7:42 AM

## 2023-01-25 NOTE — Progress Notes (Signed)
NAME:  Scott Lindsey, MRN:  [id number], DOB:  1955/09/05, LOS: 7 ADMISSION DATE:  01/18/2023, CONSULTATION DATE: 1/10 REFERRING MD: Wallace Cullens, CHIEF COMPLAINT: Cardiac arrest  History of Present Illness:  68 year old male patient with history of COPD, pulmonary fibrosis, ECOG 1 with new Dx metastatic squamous cell carcinoma presumed to be primary lung with mets to liver and lymph nodes Just discharged from the hospital on 1/8 after being treated for pneumonia and COPD exacerbation and receiving the new diagnosis of lung cancer.  Has yet to start therapy. Presented to the [facility] emergency room on 1/10 with chief complaint worsening weakness, fatigue, and gasping for air this morning not able to tolerate really any activity.  EMS was called he was found to be in new A-fib/RVR with heart rate in the 120s to 190s, he was hypoxic and found to have pulse oximetry in the 70s with marked increased work of breathing. While in the ER he remained hypoxic, while staff was working on support patient had sudden episode of what appeared to be seizure, he bit his tongue, shortly after this suffered a PEA cardiac arrest.  Time to return of spontaneous circulation estimated at 11 minutes with ongoing ACLS.  On critical care arrival the patient was hypotensive following 1 L of crystalloid, still continued to demonstrate agonal respiratory efforts on the ventilator.  There were bloody endotracheal tube secretions, patient on propofol, withdrawing to pain in all extremities ER evaluation at time of admission HCO3 20 COVID-negative normal renal function, initial chest x-ray with previously noted right lung base opacity and bibasilar airspace disease  Pertinent  Medical History  COPD, pulmonary fibrosis, prior alcoholic pancreatitis, history of remote alcohol abuse, history of prostate cancer, hypertension, HLD, newly diagnosed metastatic squamous cell carcinoma felt lung primary site with metastasis to liver and lymph  nodes  Significant Hospital Events: Including procedures, antibiotic start and stop dates in addition to other pertinent events   1/10 just discharged on the eighth following admission for pneumonia, COPD exacerbation, and new diagnosis of squamous cell carcinoma, felt pulmonary as primary with metastasis to lymph nodes and liver presents acutely hypoxic with pulse oximetry in the 70s, marked work of breathing gasping for air, and new atrial fibrillation with RVR shortly after presentation to the ER suffered PEA cardiac arrest times ROSC estimated at 11 minutes.  Noted to be somewhat difficult intubation due to blood in airway, patient having bit tongue.  Also ER staff reporting concern about possible aspiration of dentures  Interim History / Subjective:   No acute events overnight Having thick ET tube secretions, giving high peak pressures at times   Objective   Blood pressure 99/62, pulse 98, temperature (!) 97.2 F (36.2 C), resp. rate 18, height 5\' 8"  (1.727 m), weight 72.8 kg, SpO2 97%.    Vent Mode: PRVC FiO2 (%):  [40 %] 40 % Set Rate:  [16 bmp] 16 bmp Vt Set:  [470 mL] 470 mL PEEP:  [5 cmH20] 5 cmH20 Plateau Pressure:  [19 cmH20-22 cmH20] 19 cmH20   Intake/Output Summary (Last 24 hours) at 01/25/2023 1115 Last data filed at 01/25/2023 0600 Gross per 24 hour  Intake 1738.31 ml  Output 1430 ml  Net 308.31 ml   Filed Weights   01/22/23 0500 01/24/23 0423 01/25/23 0432  Weight: 72.9 kg 73.4 kg 72.8 kg   Examination: General: Critically ill, intubated HENT: ET tube in good position Lungs: course breath sounds bilaterally Cardiac rrr Abdomen nondistended with positive bowel sounds Extremities  no edema Neuro: sedated, intermittent myoclonic jerks on sedation at this time  Resolved Hospital Problem list     Assessment & Plan:  Acute hypoxic respiratory failure Pulmonary Hypertension in setting of extrenal compression of pulmonary artery due to lung cancer COPD with  acute exacerbation Presumed contribution of new onset atrial fibrillation to his decompensation, respiratory failure -PRVC 8 cc/kg -Scheduled bronchodilators Brovana and Yupelri -Scheduled Pulmicort nebs -hypertonic nebs BID  -prednisone taper -Atrial fibrillation management as below - completed course of cefepime  S/p PEA cardiac arrest.   Plan -supportive care  Circulatory shock status postcardiac arrest.  Initially due to obstructive shock and impact of mediastinal/hilar mass with cardiogenic component due to Atrial fibrillation with RVR. On going shock likely due to sedation.  Plan -Wean norepinephrine as able -Home antihypertensive regimen is on hold (Cozaar, clonidine, metoprolol)  A-fib with RVR.  Suspect this is secondary to his acute illness Plan -Continue amiodarone -Continue heparin -Telemetry monitoring  Acute Anoxic Brain Injury Myoclonus -Cerebell with possible epileptiform discharges but no overt seizures.  -Continue empiric Keppra 500 mg twice daily - MRI has confirmed anoxic brain injury, reviewed with family about the devastating injury.  - titrate propofol and versed pushes as needed for myoclonus  Anemia with bloody oral secretions after biting tongue Current hemoglobin not reflecting bloody oral secretions as was obtained prior to seizure Plan -Follow for any evidence of recurrent bleeding, follow CBC on heparin  Steroid-induced hyperglycemia Plan Sliding scale insulin as ordered  Best Practice (right click and "Reselect all SmartList Selections" daily)   Diet/type: tubefeeds DVT prophylaxis systemic heparin Pressure ulcer(s): N/A GI prophylaxis: PPI Lines: Central line and yes and it is still needed Foley:  Yes, and it is still needed Code Status:  full code Last date of multidisciplinary goals of care discussion [1/15 touched base with patient's wife, plan is to transition to comfort care on Saturday. ]  Labs   CBC: Recent Labs  Lab  01/18/23 1509 01/18/23 1754 01/21/23 0517 01/22/23 0435 01/23/23 0457 01/24/23 0408 01/25/23 0402  WBC 18.8*   < > 14.8* 15.7* 16.0* 14.4* 14.8*  NEUTROABS 15.6*  --   --   --   --   --   --   HGB 9.3*   < > 7.0* 7.1* 7.1* 7.1* 7.4*  HCT 28.6*   < > 22.4* 22.8* 23.0* 23.3* 23.0*  MCV 97.3   < > 98.7 99.6 100.0 101.3* 100.9*  PLT 151   < > 114* 123* 121* 133* 143*   < > = values in this interval not displayed.    Basic Metabolic Panel: Recent Labs  Lab 01/19/23 1149 01/20/23 0354 01/21/23 0517 01/22/23 0435 01/23/23 0457 01/24/23 0812 01/25/23 0402  NA 134*   < > 138 131* 138 136 136  K 3.6   < > 3.9 3.3* 4.0 3.9 4.1  CL 105   < > 110 100 111 108 108  CO2 21*   < > 21* 19* 19* 20* 12*  GLUCOSE 162*   < > 123* 416* 101* 103* 134*  BUN 20   < > 27* 29* 41* 41* 45*  CREATININE 0.82   < > 1.06 1.02 1.14 1.17 1.12  CALCIUM 9.4   < > 9.2 8.8* 9.7 9.8 8.2*  MG 1.7  --   --  2.1 2.2 2.3 2.2  PHOS 4.0  --   --  2.7 3.0 2.7 3.0   < > = values in this interval not  displayed.   GFR: Estimated Creatinine Clearance: 60.2 mL/min (by C-G formula based on SCr of 1.12 mg/dL). Recent Labs  Lab 01/18/23 1754 01/19/23 1149 01/20/23 0354 01/21/23 0517 01/22/23 0435 01/23/23 0457 01/24/23 0408 01/25/23 0402  PROCALCITON 0.56 2.71 2.21 1.16  --   --   --   --   WBC 17.0* 16.4* 18.9* 14.8* 15.7* 16.0* 14.4* 14.8*  LATICACIDVEN 3.8*  --   --   --   --   --   --   --     Liver Function Tests: Recent Labs  Lab 01/20/23 0354 01/21/23 0517  AST 47* 24  ALT 92* 67*  ALKPHOS 57 56  BILITOT 0.6 0.4  PROT 5.8* 5.5*  ALBUMIN 2.2* 2.0*   No results for input(s): "LIPASE", "AMYLASE" in the last 168 hours. No results for input(s): "AMMONIA" in the last 168 hours.  ABG    Component Value Date/Time   PHART 7.43 01/20/2023 0326   PCO2ART 30 (L) 01/20/2023 0326   PO2ART 102 01/20/2023 0326   HCO3 20.1 01/20/2023 0326   ACIDBASEDEF 3.4 (H) 01/20/2023 0326   O2SAT 99.7 01/20/2023  0326     Coagulation Profile: No results for input(s): "INR", "PROTIME" in the last 168 hours.  Cardiac Enzymes: No results for input(s): "CKTOTAL", "CKMB", "CKMBINDEX", "TROPONINI" in the last 168 hours.  HbA1C: Hgb A1c MFr Bld  Date/Time Value Ref Range Status  01/18/2023 05:54 PM 6.3 (H) 4.8 - 5.6 % Final    Comment:    (NOTE) Pre diabetes:          5.7%-6.4%  Diabetes:              >6.4%  Glycemic control for   <7.0% adults with diabetes     CBG: Recent Labs  Lab 01/24/23 1914 01/24/23 2340 01/25/23 0315 01/25/23 0756 01/25/23 0759  GLUCAP 155* 140* 116* 30* 190*     Critical care time: 33 min      Melody Comas, MD [facility] Office: [phone]   See Amion for personal pager PCCM on call pager [phone] until 7pm. Please call Elink 7p-7a. [phone]

## 2023-01-25 NOTE — Plan of Care (Signed)
  Problem: Activity: Goal: Ability to tolerate increased activity will improve Outcome: Progressing   Problem: Respiratory: Goal: Ability to maintain a clear airway and adequate ventilation will improve Outcome: Progressing   Problem: Education: Goal: Knowledge of General Education information will improve Description: Including pain rating scale, medication(s)/side effects and non-pharmacologic comfort measures Outcome: Progressing   Problem: Health Behavior/Discharge Planning: Goal: Ability to manage health-related needs will improve Outcome: Progressing   Problem: Clinical Measurements: Goal: Ability to maintain clinical measurements within normal limits will improve Outcome: Progressing Goal: Will remain free from infection Outcome: Progressing Goal: Diagnostic test results will improve Outcome: Progressing Goal: Respiratory complications will improve Outcome: Progressing Goal: Cardiovascular complication will be avoided Outcome: Progressing   Problem: Activity: Goal: Risk for activity intolerance will decrease Outcome: Progressing   Problem: Nutrition: Goal: Adequate nutrition will be maintained Outcome: Progressing   Problem: Coping: Goal: Level of anxiety will decrease Outcome: Progressing   Problem: Elimination: Goal: Will not experience complications related to bowel motility Outcome: Progressing Goal: Will not experience complications related to urinary retention Outcome: Progressing   Problem: Pain Management: Goal: General experience of comfort will improve Outcome: Progressing   Problem: Safety: Goal: Ability to remain free from injury will improve Outcome: Progressing   Problem: Skin Integrity: Goal: Risk for impaired skin integrity will decrease Outcome: Progressing   Cindy S. Clelia Croft BSN, RN, CCRP, CCRN 01/25/2023 5:04 AM

## 2023-01-25 NOTE — Plan of Care (Signed)
Patient remains on vent and unresponsive, remains on Propofol, Heparin and Amiodarone, was able to wean off Levophed, will keep order active, so in case we have to restart Levophed.  Problem: Education: Goal: Ability to describe self-care measures that may prevent or decrease complications (Diabetes Survival Skills Education) will improve Outcome: Progressing Goal: Individualized Educational Video(s) Outcome: Progressing   Problem: Coping: Goal: Ability to adjust to condition or change in health will improve Outcome: Progressing   Problem: Fluid Volume: Goal: Ability to maintain a balanced intake and output will improve Outcome: Progressing   Problem: Health Behavior/Discharge Planning: Goal: Ability to identify and utilize available resources and services will improve Outcome: Progressing Goal: Ability to manage health-related needs will improve Outcome: Progressing   Problem: Metabolic: Goal: Ability to maintain appropriate glucose levels will improve Outcome: Progressing   Problem: Nutritional: Goal: Maintenance of adequate nutrition will improve Outcome: Progressing Goal: Progress toward achieving an optimal weight will improve Outcome: Progressing   Problem: Skin Integrity: Goal: Risk for impaired skin integrity will decrease Outcome: Progressing   Problem: Tissue Perfusion: Goal: Adequacy of tissue perfusion will improve Outcome: Progressing   Problem: Activity: Goal: Ability to tolerate increased activity will improve Outcome: Progressing   Problem: Respiratory: Goal: Ability to maintain a clear airway and adequate ventilation will improve Outcome: Progressing   Problem: Education: Goal: Knowledge of General Education information will improve Description: Including pain rating scale, medication(s)/side effects and non-pharmacologic comfort measures Outcome: Progressing   Problem: Health Behavior/Discharge Planning: Goal: Ability to manage health-related  needs will improve Outcome: Progressing   Problem: Clinical Measurements: Goal: Ability to maintain clinical measurements within normal limits will improve Outcome: Progressing Goal: Will remain free from infection Outcome: Progressing Goal: Diagnostic test results will improve Outcome: Progressing Goal: Respiratory complications will improve Outcome: Progressing Goal: Cardiovascular complication will be avoided Outcome: Progressing   Problem: Activity: Goal: Risk for activity intolerance will decrease Outcome: Progressing   Problem: Nutrition: Goal: Adequate nutrition will be maintained Outcome: Progressing   Problem: Coping: Goal: Level of anxiety will decrease Outcome: Progressing   Problem: Elimination: Goal: Will not experience complications related to bowel motility Outcome: Progressing Goal: Will not experience complications related to urinary retention Outcome: Progressing   Problem: Pain Management: Goal: General experience of comfort will improve Outcome: Progressing   Problem: Safety: Goal: Ability to remain free from injury will improve Outcome: Progressing   Problem: Skin Integrity: Goal: Risk for impaired skin integrity will decrease Outcome: Progressing

## 2023-01-26 DIAGNOSIS — I469 Cardiac arrest, cause unspecified: Secondary | ICD-10-CM | POA: Diagnosis not present

## 2023-01-26 LAB — GLUCOSE, CAPILLARY
Glucose-Capillary: 80 mg/dL (ref 70–99)
Glucose-Capillary: 101 mg/dL — ABNORMAL HIGH (ref 70–99)
Glucose-Capillary: <10 mg/dL — CL (ref 70–99)
Glucose-Capillary: 13 mg/dL — CL (ref 70–99)
Glucose-Capillary: >600 mg/dL (ref 70–99)
Glucose-Capillary: >600 mg/dL (ref 70–99)

## 2023-01-26 LAB — CBC
HCT: 25.2 % — ABNORMAL LOW (ref 39.0–52.0)
Hemoglobin: 7.6 g/dL — ABNORMAL LOW (ref 13.0–17.0)
MCH: 30.9 pg (ref 26.0–34.0)
MCHC: 30.2 g/dL (ref 30.0–36.0)
MCV: 102.4 fL — ABNORMAL HIGH (ref 80.0–100.0)
Platelets: 145 10*3/uL — ABNORMAL LOW (ref 150–400)
RBC: 2.46 MIL/uL — ABNORMAL LOW (ref 4.22–5.81)
RDW: 17.6 % — ABNORMAL HIGH (ref 11.5–15.5)
WBC: 13.8 10*3/uL — ABNORMAL HIGH (ref 4.0–10.5)
nRBC: 0.3 % — ABNORMAL HIGH (ref 0.0–0.2)

## 2023-01-26 LAB — HEPARIN LEVEL (UNFRACTIONATED): Heparin Unfractionated: 0.51 [IU]/mL (ref 0.30–0.70)

## 2023-01-26 LAB — TRIGLYCERIDES: Triglycerides: 79 mg/dL (ref <150)

## 2023-01-26 MED ORDER — FENTANYL 2500MCG IN NS 250ML (10MCG/ML) PREMIX INFUSION: 0.0000 ug/h | INTRAVENOUS | Status: DC

## 2023-01-26 MED ORDER — SODIUM CHLORIDE 0.9% FLUSH
3.0000 mL | Freq: Two times a day (BID) | INTRAVENOUS | Status: DC
Start: 2023-01-26 — End: 2023-01-26

## 2023-01-26 MED ORDER — ACETAMINOPHEN 650 MG RE SUPP: 650.0000 mg | Freq: Four times a day (QID) | RECTAL | Status: DC | PRN

## 2023-01-26 MED ORDER — GLYCOPYRROLATE 1 MG PO TABS
1.0000 mg | ORAL_TABLET | ORAL | Status: DC | PRN
Start: 2023-01-26 — End: 2023-01-26

## 2023-01-26 MED ORDER — MIDAZOLAM-SODIUM CHLORIDE 100-0.9 MG/100ML-% IV SOLN
0.0000 mg/h | INTRAVENOUS | Status: DC
Start: 2023-01-26 — End: 2023-01-27
  Administered 2023-01-26: 1 mg/h via INTRAVENOUS
  Filled 2023-01-26: qty 100

## 2023-01-26 MED ORDER — MIDAZOLAM HCL 2 MG/2ML IJ SOLN: 1.0000 mg | INTRAMUSCULAR | Status: DC | PRN

## 2023-01-26 MED ORDER — SODIUM CHLORIDE 0.9% FLUSH: 3.0000 mL | INTRAVENOUS | Status: DC | PRN

## 2023-01-26 MED ORDER — ACETAMINOPHEN 325 MG PO TABS: 650.0000 mg | ORAL_TABLET | Freq: Four times a day (QID) | ORAL | Status: DC | PRN

## 2023-01-26 MED ORDER — POLYVINYL ALCOHOL 1.4 % OP SOLN: 1.0000 [drp] | Freq: Four times a day (QID) | OPHTHALMIC | Status: DC | PRN

## 2023-01-26 MED ORDER — MIDAZOLAM BOLUS VIA INFUSION (WITHDRAWAL LIFE SUSTAINING TX): 2.0000 mg | INTRAVENOUS | Status: DC | PRN

## 2023-01-26 MED ORDER — FENTANYL 2500MCG IN NS 250ML (10MCG/ML) PREMIX INFUSION
0.0000 ug/h | INTRAVENOUS | Status: DC
Start: 2023-01-26 — End: 2023-01-27
  Administered 2023-01-26: 50 ug/h via INTRAVENOUS
  Filled 2023-01-26: qty 250

## 2023-01-26 MED ORDER — GLYCOPYRROLATE 0.2 MG/ML IJ SOLN: 0.2000 mg | INTRAMUSCULAR | Status: DC | PRN

## 2023-01-26 MED ORDER — FENTANYL BOLUS VIA INFUSION
100.0000 ug | INTRAVENOUS | Status: DC | PRN
  Administered 2023-01-26 (×2): 100 ug via INTRAVENOUS

## 2023-01-26 MED ORDER — ACETAMINOPHEN 650 MG RE SUPP
650.0000 mg | Freq: Four times a day (QID) | RECTAL | Status: DC | PRN
Start: 2023-01-26 — End: 2023-01-26

## 2023-01-26 NOTE — Plan of Care (Signed)
Problem: Education: Goal: Ability to describe self-care measures that may prevent or decrease complications (Diabetes Survival Skills Education) will improve 01/15/2023 2252 by [employer], RN Outcome: Completed/Met 01/19/2023 2251 by [employer], RN Outcome: Adequate for Discharge 01/09/2023 2249 by [employer], RN Outcome: Progressing Goal: Individualized Educational Video(s) 01/20/2023 2252 by [employer], RN Outcome: Completed/Met 02/03/2023 2251 by [employer], RN Outcome: Adequate for Discharge 01/09/2023 2249 by [employer], RN Outcome: Progressing   Problem: Coping: Goal: Ability to adjust to condition or change in health will improve 01/20/2023 2252 by [employer], RN Outcome: Completed/Met 01/20/2023 2251 by [employer], RN Outcome: Adequate for Discharge 01/25/2023 2249 by [employer], RN Outcome: Progressing   Problem: Fluid Volume: Goal: Ability to maintain a balanced intake and output will improve 01/14/2023 2252 by [employer], RN Outcome: Completed/Met 01/19/2023 2251 by [employer], RN Outcome: Adequate for Discharge 01/19/2023 2249 by [employer], RN Outcome: Progressing   Problem: Health Behavior/Discharge Planning: Goal: Ability to identify and utilize available resources and services will improve 02/08/2023 2252 by [employer], RN Outcome: Completed/Met 01/31/2023 2251 by [employer], RN Outcome: Adequate for Discharge 01/13/2023 2249 by [employer], RN Outcome: Progressing Goal: Ability to manage health-related needs will improve 01/29/2023 2252 by [employer], RN Outcome: Completed/Met 02/07/2023 2251 by [employer], RN Outcome: Adequate for Discharge 01/28/2023 2249 by [employer], RN Outcome: Progressing   Problem: Metabolic: Goal: Ability to maintain appropriate glucose levels will improve 01/13/2023 2252 by [employer], RN Outcome: Completed/Met 01/24/2023 2251  by Just[employer], RN Outcome: Adequate for Discharge 02/08/2023 2249 by [employer], RN Outcome: Progressing   Problem: Nutritional: Goal: Maintenance of adequate nutrition will improve 01/22/2023 2252 by Just[employer], RN Outcome: Completed/Met 01/24/2023 2251 by Just[employer], RN Outcome: Adequate for Discharge 02/08/2023 2249 by [employer], RN Outcome: Progressing Goal: Progress toward achieving an optimal weight will improve 02/05/2023 2252 by [employer], RN Outcome: Completed/Met 01/22/2023 2251 by [employer], RN Outcome: Adequate for Discharge 02/02/2023 2249 by [employer], RN Outcome: Progressing   Problem: Skin Integrity: Goal: Risk for impaired skin integrity will decrease 01/22/2023 2252 by Just[employer], RN Outcome: Completed/Met 01/22/2023 2251 by [employer], RN Outcome: Adequate for Discharge 01/31/2023 2249 by Just[employer], RN Outcome: Progressing   Problem: Tissue Perfusion: Goal: Adequacy of tissue perfusion will improve 01/15/2023 2252 by [employer], RN Outcome: Completed/Met 01/25/2023 2251 by [employer], RN Outcome: Adequate for Discharge 01/10/2023 2249 by [employer], RN Outcome: Progressing   Problem: Activity: Goal: Ability to tolerate increased activity will improve 01/29/2023 2252 by [employer], RN Outcome: Completed/Met 01/28/2023 2251 by [employer], RN Outcome: Adequate for Discharge 01/25/2023 2249 by [employer], RN Outcome: Progressing   Problem: Respiratory: Goal: Ability to maintain a clear airway and adequate ventilation will improve 02/01/2023 2252 by [employer], RN Outcome: Completed/Met 01/09/2023 2251 by Just[employer], RN Outcome: Adequate for Discharge 01/12/2023 2249 by [employer], RN Outcome: Progressing   Problem: Education: Goal: Knowledge of General Education information will improve Description: Including pain rating scale,  medication(s)/side effects and non-pharmacologic comfort measures 01/13/2023 2252 by [employer], RN Outcome: Completed/Met 01/20/2023 2251 by [employer], RN Outcome: Adequate for Discharge 01/14/2023 2249 by Just[employer], RN Outcome: Progressing  Problem: Health Behavior/Discharge Planning: Goal: Ability to manage health-related needs will improve 01/30/2023 2252 by [employer], RN Outcome: Completed/Met 02/05/2023 2251 by Just[employer], RN Outcome: Adequate for Discharge 01/09/2023 2249 by [employer], RN Outcome: Progressing   Problem: Clinical Measurements: Goal: Ability to maintain clinical measurements within normal limits will improve 02/06/2023 2252 by Just[employer], RN Outcome: Completed/Met 02/02/2023 2251 by [employer], RN Outcome: Adequate for Discharge 01/19/2023 2249 by [employer], RN Outcome: Progressing Goal: Will remain free from infection 01/13/2023 2252 by [employer], RN Outcome: Completed/Met 02/03/2023 2251 by [employer], RN Outcome: Adequate for Discharge 01/09/2023 2249 by [employer], RN Outcome: Progressing Goal: Diagnostic test results will improve 02/06/2023 2252 by [employer], RN Outcome: Completed/Met 02/03/2023 2251 by Just[employer], RN Outcome: Adequate for Discharge 01/11/2023 2249 by [employer], RN Outcome: Progressing Goal: Respiratory complications will improve 01/15/2023 2252 by Just[employer], RN Outcome: Completed/Met 02/03/2023 2251 by Just[employer], RN Outcome: Adequate for Discharge 01/15/2023 2249 by Just[employer], RN Outcome: Progressing Goal: Cardiovascular complication will be avoided 01/12/2023 2252 by Just[employer], RN Outcome: Completed/Met 02/02/2023 2251 by Just[employer], RN Outcome: Adequate for Discharge 01/11/2023 2249 by [employer], RN Outcome: Progressing   Problem: Activity: Goal: Risk for activity intolerance will  decrease 01/29/2023 2252 by Justus [employer], RN Outcome: Completed/Met 02/03/2023 2251 by [employer], RN Outcome: Adequate for Discharge 01/15/2023 2249 by [employer], RN Outcome: Progressing   Problem: Nutrition: Goal: Adequate nutrition will be maintained 01/14/2023 2252 by [employer], RN Outcome: Completed/Met 01/29/2023 2251 by [employer], RN Outcome: Adequate for Discharge 01/24/2023 2249 by [employer], RN Outcome: Progressing   Problem: Coping: Goal: Level of anxiety will decrease 01/22/2023 2252 by Just[employer], RN Outcome: Completed/Met 01/10/2023 2251 by Just[employer], RN Outcome: Adequate for Discharge 01/22/2023 2249 by [employer], RN Outcome: Progressing   Problem: Elimination: Goal: Will not experience complications related to bowel motility 01/10/2023 2252 by Just[employer], RN Outcome: Completed/Met 01/30/2023 2251 by [employer], RN Outcome: Adequate for Discharge 01/22/2023 2249 by [employer], RN Outcome: Progressing Goal: Will not experience complications related to urinary retention 01/15/2023 2252 by [employer], RN Outcome: Completed/Met 01/19/2023 2251 by [employer], RN Outcome: Adequate for Discharge 02/01/2023 2249 by [employer], RN Outcome: Progressing   Problem: Pain Management: Goal: General experience of comfort will improve 01/09/2023 2252 by Just[employer], RN Outcome: Completed/Met 01/22/2023 2251 by [employer], RN Outcome: Adequate for Discharge 01/29/2023 2249 by [employer], RN Outcome: Progressing   Problem: Safety: Goal: Ability to remain free from injury will improve 01/20/2023 2252 by Just[employer], RN Outcome: Completed/Met 01/30/2023 2251 by [employer], RN Outcome: Adequate for Discharge 01/12/2023 2249 by [employer], RN Outcome: Progressing   Problem: Skin Integrity: Goal: Risk for impaired skin integrity will decrease 01/22/2023 2252  by [employer], RN Outcome: Completed/Met 02/02/2023 2251 by [employer], RN Outcome: Adequate for Discharge 02/05/2023 2249 by Just[employer], RN Outcome: Progressing

## 2023-01-26 NOTE — Progress Notes (Signed)
[facility] contacted, reference number is [id number], patient not a suitable for donation due to medical history, will call once patient expires.

## 2023-01-26 NOTE — Progress Notes (Signed)
NAME:  Scott Lindsey, MRN:  [id number], DOB:  1955-08-10, LOS: 8 ADMISSION DATE:  01/18/2023, CONSULTATION DATE: 1/10 REFERRING MD: Wallace Cullens, CHIEF COMPLAINT: Cardiac arrest  History of Present Illness:  68 year old male patient with history of COPD, pulmonary fibrosis, ECOG 1 with new Dx metastatic squamous cell carcinoma presumed to be primary lung with mets to liver and lymph nodes Just discharged from the hospital on 1/8 after being treated for pneumonia and COPD exacerbation and receiving the new diagnosis of lung cancer.  Has yet to start therapy. Presented to the [facility] emergency room on 1/10 with chief complaint worsening weakness, fatigue, and gasping for air this morning not able to tolerate really any activity.  EMS was called he was found to be in new A-fib/RVR with heart rate in the 120s to 190s, he was hypoxic and found to have pulse oximetry in the 70s with marked increased work of breathing. While in the ER he remained hypoxic, while staff was working on support patient had sudden episode of what appeared to be seizure, he bit his tongue, shortly after this suffered a PEA cardiac arrest.  Time to return of spontaneous circulation estimated at 11 minutes with ongoing ACLS.  On critical care arrival the patient was hypotensive following 1 L of crystalloid, still continued to demonstrate agonal respiratory efforts on the ventilator.  There were bloody endotracheal tube secretions, patient on propofol, withdrawing to pain in all extremities ER evaluation at time of admission HCO3 20 COVID-negative normal renal function, initial chest x-ray with previously noted right lung base opacity and bibasilar airspace disease  Pertinent  Medical History  COPD, pulmonary fibrosis, prior alcoholic pancreatitis, history of remote alcohol abuse, history of prostate cancer, hypertension, HLD, newly diagnosed metastatic squamous cell carcinoma felt lung primary site with metastasis to liver and lymph  nodes  Significant Hospital Events: Including procedures, antibiotic start and stop dates in addition to other pertinent events   1/10 just discharged on the eighth following admission for pneumonia, COPD exacerbation, and new diagnosis of squamous cell carcinoma, felt pulmonary as primary with metastasis to lymph nodes and liver presents acutely hypoxic with pulse oximetry in the 70s, marked work of breathing gasping for air, and new atrial fibrillation with RVR shortly after presentation to the ER suffered PEA cardiac arrest times ROSC estimated at 11 minutes.  Noted to be somewhat difficult intubation due to blood in airway, patient having bit tongue.  Also ER staff reporting concern about possible aspiration of dentures  Interim History / Subjective:   No acute events overnight Plan for family to come in today for transition of care  Objective   Blood pressure (!) 108/52, pulse 96, temperature (!) 97 F (36.1 C), resp. rate (!) 21, height 5\' 8"  (1.727 m), weight 74.5 kg, SpO2 96%.    Vent Mode: PRVC FiO2 (%):  [40 %] 40 % Set Rate:  [16 bmp] 16 bmp Vt Set:  [470 mL] 470 mL PEEP:  [5 cmH20] 5 cmH20 Plateau Pressure:  [14 cmH20-22 cmH20] 18 cmH20   Intake/Output Summary (Last 24 hours) at 01/19/2023 1030 Last data filed at 01/16/2023 0747 Gross per 24 hour  Intake 2461.68 ml  Output 765 ml  Net 1696.68 ml   Filed Weights   01/24/23 0423 01/25/23 0432 01/11/2023 0615  Weight: 73.4 kg 72.8 kg 74.5 kg   Examination: General: Critically ill, intubated HENT: ET tube in good position Lungs: course breath sounds bilaterally Cardiac rrr Abdomen nondistended with positive bowel sounds  Extremities no edema Neuro: sedated, intermittent myoclonic jerks on sedation at this time  Resolved Hospital Problem list     Assessment & Plan:  Acute hypoxic respiratory failure Pulmonary Hypertension in setting of extrenal compression of pulmonary artery due to lung cancer COPD with acute  exacerbation Presumed contribution of new onset atrial fibrillation to his decompensation, respiratory failure -PRVC 8 cc/kg -Scheduled bronchodilators Brovana and Yupelri -Scheduled Pulmicort nebs -hypertonic nebs BID  -prednisone taper -Atrial fibrillation management as below - completed course of cefepime  S/p PEA cardiac arrest.   Plan -supportive care  Circulatory shock status postcardiac arrest.  Initially due to obstructive shock and impact of mediastinal/hilar mass with cardiogenic component due to Atrial fibrillation with RVR. On going shock likely due to sedation.  Plan -Wean norepinephrine as able -Home antihypertensive regimen is on hold (Cozaar, clonidine, metoprolol)  A-fib with RVR.  Suspect this is secondary to his acute illness Plan -Continue amiodarone -Continue heparin -Telemetry monitoring  Acute Anoxic Brain Injury Myoclonus -Cerebell with possible epileptiform discharges but no overt seizures.  -Continue empiric Keppra 500 mg twice daily - MRI has confirmed anoxic brain injury, reviewed with family about the devastating injury.  - titrate propofol and versed pushes as needed for myoclonus  Anemia with bloody oral secretions after biting tongue Current hemoglobin not reflecting bloody oral secretions as was obtained prior to seizure Plan -Follow for any evidence of recurrent bleeding, follow CBC on heparin  Steroid-induced hyperglycemia Plan Sliding scale insulin as ordered  Best Practice (right click and "Reselect all SmartList Selections" daily)   Diet/type: tubefeeds DVT prophylaxis systemic heparin Pressure ulcer(s): N/A GI prophylaxis: PPI Lines: Central line and yes and it is still needed Foley:  Yes, and it is still needed Code Status:  full code Last date of multidisciplinary goals of care discussion [1/17 touched base with patient's wife, plan is to transition to comfort care on Saturday. ]  Labs   CBC: Recent Labs  Lab  01/21/23 0517 01/22/23 0435 01/23/23 0457 01/24/23 0408 01/25/23 0402  WBC 14.8* 15.7* 16.0* 14.4* 14.8*  HGB 7.0* 7.1* 7.1* 7.1* 7.4*  HCT 22.4* 22.8* 23.0* 23.3* 23.0*  MCV 98.7 99.6 100.0 101.3* 100.9*  PLT 114* 123* 121* 133* 143*    Basic Metabolic Panel: Recent Labs  Lab 01/21/23 0517 01/22/23 0435 01/23/23 0457 01/24/23 0812 01/25/23 0402 01/25/23 1625  NA 138 131* 138 136 136  --   K 3.9 3.3* 4.0 3.9 4.1  --   CL 110 100 111 108 108  --   CO2 21* 19* 19* 20* 12*  --   GLUCOSE 123* 416* 101* 103* 134*  --   BUN 27* 29* 41* 41* 45*  --   CREATININE 1.06 1.02 1.14 1.17 1.12  --   CALCIUM 9.2 8.8* 9.7 9.8 8.2*  --   MG  --  2.1 2.2 2.3 2.2 2.1  PHOS  --  2.7 3.0 2.7 3.0 3.1   GFR: Estimated Creatinine Clearance: 60.2 mL/min (by C-G formula based on SCr of 1.12 mg/dL). Recent Labs  Lab 01/19/23 1149 01/20/23 0354 01/21/23 0517 01/22/23 0435 01/23/23 0457 01/24/23 0408 01/25/23 0402  PROCALCITON 2.71 2.21 1.16  --   --   --   --   WBC 16.4* 18.9* 14.8* 15.7* 16.0* 14.4* 14.8*    Liver Function Tests: Recent Labs  Lab 01/20/23 0354 01/21/23 0517  AST 47* 24  ALT 92* 67*  ALKPHOS 57 56  BILITOT 0.6 0.4  PROT 5.8*  5.5*  ALBUMIN 2.2* 2.0*   No results for input(s): "LIPASE", "AMYLASE" in the last 168 hours. No results for input(s): "AMMONIA" in the last 168 hours.  ABG    Component Value Date/Time   PHART 7.43 01/20/2023 0326   PCO2ART 30 (L) 01/20/2023 0326   PO2ART 102 01/20/2023 0326   HCO3 20.1 01/20/2023 0326   ACIDBASEDEF 3.4 (H) 01/20/2023 0326   O2SAT 99.7 01/20/2023 0326     Coagulation Profile: No results for input(s): "INR", "PROTIME" in the last 168 hours.  Cardiac Enzymes: No results for input(s): "CKTOTAL", "CKMB", "CKMBINDEX", "TROPONINI" in the last 168 hours.  HbA1C: Hgb A1c MFr Bld  Date/Time Value Ref Range Status  01/18/2023 05:54 PM 6.3 (H) 4.8 - 5.6 % Final    Comment:    (NOTE) Pre diabetes:           5.7%-6.4%  Diabetes:              >6.4%  Glycemic control for   <7.0% adults with diabetes     CBG: Recent Labs  Lab 01/25/23 1934 01/25/23 2329 02/03/2023 0324 01/29/2023 0738 02/04/2023 0741  GLUCAP 116* 74 80 <10* 101*     Critical care time: 31 min      Melody Comas, MD [facility] Office: [phone]   See Amion for personal pager PCCM on call pager [phone] until 7pm. Please call Elink 7p-7a. [phone]

## 2023-01-26 NOTE — Progress Notes (Signed)
CCM, Charge nurse, and patient wife notified of patient time of death. Patient placement nurse Corrie Dandy also notified. Body prepared and sent to the morgue.

## 2023-01-26 NOTE — Progress Notes (Signed)
Spouse called to confirm she did not want to be here at bedside when we extubate patient, wife states she has already said her goodbyes, did state she didn't want anyone in room with patient except her, her children or grandchildren, this nurse informed her no one was her at bedside wife also gave information on funeral that they will be using.

## 2023-01-26 NOTE — Progress Notes (Signed)
eLink Physician-Brief Progress Note Patient Name: Scott Lindsey DOB: [birth year] MRN: [id number]   Date of Service  01/31/2023  HPI/Events of Note    eICU Interventions     Called wife Discussed code status Confirmed Comfort measures     Massie Maroon 01/21/2023, 10:41 PM

## 2023-01-26 NOTE — Plan of Care (Signed)
  Problem: Education: Goal: Ability to describe self-care measures that may prevent or decrease complications (Diabetes Survival Skills Education) will improve Outcome: Progressing Goal: Individualized Educational Video(s) Outcome: Progressing   Problem: Coping: Goal: Ability to adjust to condition or change in health will improve Outcome: Progressing   Problem: Fluid Volume: Goal: Ability to maintain a balanced intake and output will improve Outcome: Progressing   Problem: Health Behavior/Discharge Planning: Goal: Ability to identify and utilize available resources and services will improve Outcome: Progressing Goal: Ability to manage health-related needs will improve Outcome: Progressing   Problem: Metabolic: Goal: Ability to maintain appropriate glucose levels will improve Outcome: Progressing   Problem: Nutritional: Goal: Maintenance of adequate nutrition will improve Outcome: Progressing Goal: Progress toward achieving an optimal weight will improve Outcome: Progressing   Problem: Skin Integrity: Goal: Risk for impaired skin integrity will decrease Outcome: Progressing   Problem: Tissue Perfusion: Goal: Adequacy of tissue perfusion will improve Outcome: Progressing   Problem: Activity: Goal: Ability to tolerate increased activity will improve Outcome: Progressing   Problem: Respiratory: Goal: Ability to maintain a clear airway and adequate ventilation will improve Outcome: Progressing   Problem: Education: Goal: Knowledge of General Education information will improve Description: Including pain rating scale, medication(s)/side effects and non-pharmacologic comfort measures Outcome: Progressing   Problem: Health Behavior/Discharge Planning: Goal: Ability to manage health-related needs will improve Outcome: Progressing   Problem: Clinical Measurements: Goal: Ability to maintain clinical measurements within normal limits will improve Outcome:  Progressing Goal: Will remain free from infection Outcome: Progressing Goal: Diagnostic test results will improve Outcome: Progressing Goal: Respiratory complications will improve Outcome: Progressing Goal: Cardiovascular complication will be avoided Outcome: Progressing   Problem: Activity: Goal: Risk for activity intolerance will decrease Outcome: Progressing   Problem: Nutrition: Goal: Adequate nutrition will be maintained Outcome: Progressing   Problem: Coping: Goal: Level of anxiety will decrease Outcome: Progressing   Problem: Elimination: Goal: Will not experience complications related to bowel motility Outcome: Progressing Goal: Will not experience complications related to urinary retention Outcome: Progressing   Problem: Pain Management: Goal: General experience of comfort will improve Outcome: Progressing   Problem: Safety: Goal: Ability to remain free from injury will improve Outcome: Progressing   Problem: Skin Integrity: Goal: Risk for impaired skin integrity will decrease Outcome: Progressing

## 2023-01-26 NOTE — Progress Notes (Signed)
[employer] notified of time of death, which was 23:30

## 2023-01-26 NOTE — IPAL (Signed)
  Interdisciplinary Goals of Care Family Meeting   Date carried out: 01/16/2023  Location of the meeting: Bedside  Member's involved: Physician, Family Member or next of kin, and Other: Patient's family  Durable Power of Attorney or acting medical decision maker: Lynnae January    Discussion: We discussed goals of care for Scott Lindsey .  Patient's family all in agreement with moving forward with comfort care. They understand his significant anoxic brain injury and poor prognosis. Plan to move forward with comfort care.  Code status:   Code Status: Do not attempt resuscitation (DNR) - Comfort care   Disposition: In-patient comfort care  Time spent for the meeting: 15 minutes    Martina Sinner, MD  01/22/2023, 3:21 PM

## 2023-01-26 NOTE — Progress Notes (Addendum)
PHARMACY - ANTICOAGULATION CONSULT NOTE  Pharmacy Consult for Heparin Indication: atrial fibrillation  No Known Allergies  Patient Measurements: Height: 5\' 8"  (172.7 cm) Weight: 74.5 kg (164 lb 3.9 oz) IBW/kg (Calculated) : 68.4 Heparin Dosing Weight: TBW  Vital Signs: Temp: 96.6 F (35.9 C) (01/18 1400) Temp Source: Esophageal (01/18 1400) BP: 111/55 (01/18 1400) Pulse Rate: 89 (01/18 1400)  Labs: Recent Labs    01/24/23 0408 01/24/23 0812 01/25/23 0402 01/12/2023 1010 01/24/2023 1012  HGB 7.1*  --  7.4* 7.6*  --   HCT 23.3*  --  23.0* 25.2*  --   PLT 133*  --  143* 145*  --   HEPARINUNFRC 0.67  --  0.55  --  0.51  CREATININE  --  1.17 1.12  --   --     Estimated Creatinine Clearance: 60.2 mL/min (by C-G formula based on SCr of 1.12 mg/dL).    Assessment: 71 yoM presented to ED on 1/10 with fatigue, chest tightness, and SOB. Noted to be in Afib here and during a recent admission. Pharmacy consulted to start heparin infusion for Afib. PMH significant for COPD, EtOH use, HTN, symptomatic PVCs, new Dx SCC of R lung; no prior anticoagulation. Pharmacy to dose IV heparin.  Today, 01/12/2023: HL remains therapeutic and stable on 750 units/hr Hgb/plt low but stable SCr remains stable WNL     Goal of Therapy:  Heparin level 0.3-0.7 units/ml Monitor platelets by anticoagulation protocol: Yes   Plan:  Continue heparin infusion at 750 units/hr Daily heparin level and CBC while on infusion  Keiffer Piper A, PharmD, BCPS Clinical Pharmacist 01/23/2023 2:47 PM

## 2023-01-28 ENCOUNTER — Encounter (HOSPITAL_COMMUNITY): Payer: Self-pay | Admitting: Internal Medicine

## 2023-01-28 ENCOUNTER — Ambulatory Visit: Payer: Medicare Other

## 2023-01-29 ENCOUNTER — Ambulatory Visit: Payer: Medicare Other

## 2023-01-30 ENCOUNTER — Encounter (HOSPITAL_COMMUNITY): Payer: Self-pay | Admitting: Internal Medicine

## 2023-01-30 ENCOUNTER — Ambulatory Visit: Payer: Medicare Other

## 2023-01-31 ENCOUNTER — Ambulatory Visit: Payer: Medicare Other | Admitting: Internal Medicine

## 2023-01-31 ENCOUNTER — Ambulatory Visit: Payer: Medicare Other

## 2023-02-01 ENCOUNTER — Ambulatory Visit: Payer: Medicare Other

## 2023-02-04 ENCOUNTER — Ambulatory Visit: Payer: Medicare Other

## 2023-02-05 ENCOUNTER — Ambulatory Visit: Payer: Medicare Other

## 2023-02-06 ENCOUNTER — Ambulatory Visit: Payer: Medicare Other

## 2023-02-07 ENCOUNTER — Ambulatory Visit: Payer: Medicare Other

## 2023-02-08 ENCOUNTER — Ambulatory Visit: Payer: Medicare Other

## 2023-02-09 NOTE — Death Summary Note (Signed)
DEATH SUMMARY   Patient Details  Name: Scott Lindsey MRN: [id number] DOB: [birth year]  Admission/Discharge Information   Admit Date:  Feb 10, 2023  Date of Death: Date of Death: 2023/02/18  Time of Death: Time of Death: Mar 06, 2328  Length of Stay: 03/12/23  Referring Physician: Etta Grandchild, MD   Reason(s) for Hospitalization    Diagnoses  Preliminary cause of death:  Anoxic Brain Injury  Secondary Diagnoses (including complications and co-morbidities):  Principal Problem:   Acute respiratory failure [facility]) Active Problems:   Cardiac arrest [facility]) Metastatic Lung Cancer Pulmonary hypertension Right heart failure Atrial Fibrillation with RVR COPD Steroid induced hyperglycemia Anemia  Brief Hospital Course (including significant findings, care, treatment, and services provided and events leading to death)  Scott Lindsey is a 68 y.o. year old male patient with history of COPD, pulmonary fibrosis, ECOG 1 with new Dx metastatic squamous cell carcinoma presumed to be primary lung with mets to liver and lymph nodes who was discharged from the hospital on 1/8 after being treated for pneumonia and COPD exacerbation and receiving the new diagnosis of lung cancer.  Presented to the [facility] emergency room on 02-10-23 with chief complaint worsening weakness, fatigue, and gasping for air this morning not able to tolerate really any activity.  EMS was called he was found to be in new A-fib/RVR with heart rate in the 120s to 190s, he was hypoxic and found to have pulse oximetry in the 70s with marked increased work of breathing. While in the ER he remained hypoxic, while staff was working on support patient had sudden episode of what appeared to be seizure, he bit his tongue, shortly after this suffered a PEA cardiac arrest.  Time to return of spontaneous circulation estimated at 11 minutes with ongoing ACLS.  On critical care arrival the patient was hypotensive following 1 L of crystalloid, still continued to  demonstrate agonal respiratory efforts on the ventilator.  There were bloody endotracheal tube secretions, patient on propofol, withdrawing to pain in all extremities. ER evaluation at time of admission HCO3 20 COVID-negative normal renal function, initial chest x-ray with previously noted right lung base opacity and bibasilar airspace disease.  He was admitted to the ICU. MRI Brain 1/12 confirmed diffuse anoxic brain injury. Scans reviewed with neurology with poor prognosis. Patient's family was updated on the MRI results and discussions held on transition to comfort care. He was transitioned on 02-18-23 and passed away at 03/06/2328.  Pertinent Labs and Studies  Significant Diagnostic Studies MR BRAIN W WO CONTRAST Result Date: 01/20/2023 CLINICAL DATA:  Anoxic brain damage. Anoxic mild clonus. Lung cancer. Recent episode of PE a cardiac arrest with 11 minutes of resuscitation. EXAM: MRI HEAD WITHOUT AND WITH CONTRAST TECHNIQUE: Multiplanar, multiecho pulse sequences of the brain and surrounding structures were obtained without and with intravenous contrast. CONTRAST:  7mL GADAVIST GADOBUTROL 1 MMOL/ML IV SOLN COMPARISON:  MR head without and with contrast 01/08/2023. FINDINGS: Brain: The diffusion-weighted images demonstrate restricted diffusion in the caudate lobe and lentiform nucleus bilaterally. No cortical foci of restricted diffusion are present. Acute hemorrhage or mass lesion is present. Periventricular T2 and FLAIR hyperintensities are stable. T2 and FLAIR signal hyperintensity in the caudate and lentiform nucleus bilaterally is new. White matter changes and remote lacunar infarcts in the brainstem are stable. Cerebellum is within normal limits. The internal auditory canals are within normal limits. Midline structures are within normal limits. Vascular: No flow scratched at chronic obstruction of the left vertebral artery is  again noted. Flow is present in the right vertebral artery and basilar artery.  Flow is present in the anterior circulation bilaterally. Skull and upper cervical spine: The craniocervical junction is normal. Upper cervical spine is within normal limits. Marrow signal is unremarkable. Sinuses/Orbits: Fluid levels are present in the sphenoid sinuses bilaterally. Fluid levels are present in the right greater than left ethmoid air cells. Fluid is present in the nasopharynx. Patient is intubated. The globes and orbits are within normal limits. IMPRESSION: 1. Restricted diffusion in the caudate lobe and lentiform nucleus bilaterally with associated T2 and FLAIR signal hyperintensity. This is consistent with acute hypoxic ischemic injury. 2. No cortical foci of restricted diffusion are present. 3. Stable chronic small vessel ischemic changes of the white matter. 4. 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 5. Fluid levels in the sphenoid sinuses bilaterally and right greater than left ethmoid air cells compatible with acute sinusitis. Electronically Signed   By: Marin Roberts M.D.   On: 01/20/2023 11:30   DG Chest Port 1 View Result Date: 01/20/2023 CLINICAL DATA:  68 year old male with respiratory failure. Lung cancer. EXAM: PORTABLE CHEST 1 VIEW COMPARISON:  Portable chest yesterday and earlier. FINDINGS: Portable AP semi upright view at 0655 hours. Stable rotation to the right. Endotracheal tube and enteric tube in good position. Stable lung volumes, mediastinal contours, with infiltrative mediastinal mass on CTA 2 days ago. Stable ventilation. No pneumothorax or pulmonary edema. Confluent right lower lobe opacity superimposed on bilateral subpleural reticular scarring. No significant pleural effusion. Stable visualized osseous structures. IMPRESSION: 1. Satisfactory lines and tubes. 2. Mediastinal infiltrative tumor with ongoing dense right lower lobe opacifications which could be aspiration, pneumonia, tumor. 3. No new cardiopulmonary abnormality. Electronically Signed   By: Odessa Fleming M.D.   On: 01/20/2023 09:16   Rapid EEG Result Date: 01/19/2023 Charlsie Quest, MD     01/20/2023  8:46 AM Patient Name: TABIAS SWAYZE MRN: [id number] Epilepsy Attending: Charlsie Quest Referring Physician/Provider: Simonne Martinet, NP Duration: 01/18/2023 1853 to 01/19/2023 1230 Patient history: 67yo m s/p cardiac arrest getting eeg to evaluate for seizure Level of alertness:  comatose AEDs during EEG study: Propofol Technical aspects: This EEG was obtained using a 10 lead EEG system positioned circumferentially without any parasagittal coverage (rapid EEG). Computer selected EEG is reviewed as  well as background features and all clinically significant events. Description: EEG initially showed continuous generalized low amplitude 3-6Hz  theta- delta slowing. Generalized spikes were noted every 15 seconds to 1 minute which gradually worsened to 1-5 seconds.  Hyperventilation and photic stimulation were not performed.   ABNORMALITY - Spikes, generalized - Continuous slow, generalized IMPRESSION: This limited ceribell EEG was suggestive of epileptogenicity with generalized onset as well as severe diffuse encephalopathy. No seizures were seen throughout the recording. If suspicion for ictal activity remains a concern, traditional eeg can be considered. Charlsie Quest   [place] EKG SITE RITE Result Date: 01/19/2023 If Site Rite image not attached, placement could not be confirmed due to current cardiac rhythm.  DG Chest Port 1 View Result Date: 01/19/2023 CLINICAL DATA:  Respiratory failure. EXAM: PORTABLE CHEST 1 VIEW COMPARISON:  01/18/2023 FINDINGS: Endotracheal tube tip is approximately 3.8 cm above the base of the carina. NG tube tip is in the gastric fundus with side port of the NG tube positioned at or just below the GE junction. Cardiopericardial silhouette is at upper limits of normal for size. Interstitial markings are diffusely coarsened with chronic features.  Bibasilar atelectasis or infiltrate  with small bilateral pleural effusions, progressive in the interval. IMPRESSION: 1. Bibasilar atelectasis or infiltrate with small bilateral pleural effusions, progressive in the interval. 2. NG tube tip is in the gastric fundus with side port of the NG tube positioned at or just below the GE junction. NG tube could be advanced 3 cm to place the side port below the GE junction as clinically warranted. Electronically Signed   By: Kennith Center M.D.   On: 01/19/2023 06:21   DG Abd Portable 1V Result Date: 01/18/2023 CLINICAL DATA:  Orogastric tube placement. EXAM: PORTABLE ABDOMEN - 1 VIEW COMPARISON:  CT 01/07/2023 FINDINGS: Tip and side port of the enteric tube below the diaphragm in the stomach. Air throughout nondilated small bowel in the right abdomen. Patchy right lower lobe airspace disease. IMPRESSION: Tip and side port of the enteric tube below the diaphragm in the stomach. Electronically Signed   By: Narda Rutherford M.D.   On: 01/18/2023 21:28   CT Head Wo Contrast Result Date: 01/18/2023 CLINICAL DATA:  Weakness and fatigue. Recent diagnosed with lung cancer. EXAM: CT HEAD WITHOUT CONTRAST TECHNIQUE: Contiguous axial images were obtained from the base of the skull through the vertex without intravenous contrast. RADIATION DOSE REDUCTION: This exam was performed according to the departmental dose-optimization program which includes automated exposure control, adjustment of the mA and/or kV according to patient size and/or use of iterative reconstruction technique. COMPARISON:  MR head without and with contrast 01/08/2023 FINDINGS: Brain: Generalized atrophy and white matter disease is stable. No acute infarct, hemorrhage, or mass lesion is present. Deep brain nuclei are within normal limits. The ventricles are of normal size. No significant extraaxial fluid collection is present. Midline structures are within normal limits. The brainstem and cerebellum are within normal limits. Vascular:  Atherosclerotic calcifications are present within the cavernous internal carotid arteries bilaterally. No hyperdense vessel is present. Skull: Calvarium is intact. No focal lytic or blastic lesions are present. Diffuse edematous changes are present about the orbits and visualized face. Sinuses/Orbits: Periorbital soft tissue swelling is present. No post septal changes are present. Globes and orbits are otherwise within normal limits. Fluid is present in the sphenoid sinuses bilaterally. Mucosal thickening is noted within the inferior frontal sinuses and anterior ethmoid air cells. Fluid is present within the posterior nasal cavity and nasopharynx. IMPRESSION: 1. No acute intracranial abnormality or significant interval change. 2. Stable atrophy and white matter disease. This likely reflects the sequela of chronic microvascular ischemia. 3. Diffuse edematous changes about the orbits and visualized face. This may reflect diffuse edema/anasarca 4. Paranasal sinus disease as described. Electronically Signed   By: Marin Roberts M.D.   On: 01/18/2023 18:42   ECHOCARDIOGRAM COMPLETE Result Date: 01/18/2023    ECHOCARDIOGRAM REPORT   Patient Name:   TOWNES FUHS Date of Exam: 01/18/2023 Medical Rec #:  [id number]     Height:       68.0 in Accession #:    [id number]    Weight:       134.0 lb Date of Birth:  [birth year]     BSA:          1.724 m Patient Age:    67 years      BP:           146/86 mmHg Patient Gender: M             HR:           98 bpm. Exam  Location:  Inpatient Procedure: 2D Echo, Intracardiac Opacification Agent, Cardiac Doppler and Color            Doppler Indications:     Cardiac Arrest  History:         Patient has prior history of Echocardiogram examinations, most                  recent 04/28/2019.  Sonographer:     Harriette Bouillon RDCS Referring Phys:  [id number] Antionette Poles BABCOCK Diagnosing Phys: Arvilla Meres MD IMPRESSIONS  1. Septal dyssynergy. Left ventricular ejection fraction, by estimation, is  40 to 45%. The left ventricle has mildly decreased function. The left ventricle demonstrates global hypokinesis. Left ventricular diastolic parameters are indeterminate.  2. Right ventricular systolic function is moderately reduced. The right ventricular size is moderately enlarged. Tricuspid regurgitation signal is inadequate for assessing PA pressure.  3. A small pericardial effusion is present. The pericardial effusion is circumferential.  4. The mitral valve is normal in structure. No evidence of mitral valve regurgitation. No evidence of mitral stenosis.  5. The aortic valve is normal in structure. Aortic valve regurgitation is not visualized. No aortic stenosis is present.  6. The inferior vena cava is dilated in size with <50% respiratory variability, suggesting right atrial pressure of 15 mmHg. Conclusion(s)/Recommendation(s): Findings suggestive of right heart strain. TR jet insufficient to assess PA pressures. FINDINGS  Left Ventricle: Septal dyssynergy. Left ventricular ejection fraction, by estimation, is 40 to 45%. The left ventricle has mildly decreased function. The left ventricle demonstrates global hypokinesis. The left ventricular internal cavity size was normal in size. There is no left ventricular hypertrophy. Left ventricular diastolic parameters are indeterminate. Right Ventricle: The right ventricular size is moderately enlarged. No increase in right ventricular wall thickness. Right ventricular systolic function is moderately reduced. Tricuspid regurgitation signal is inadequate for assessing PA pressure. Left Atrium: Left atrial size was normal in size. Right Atrium: Right atrial size was normal in size. Pericardium: A small pericardial effusion is present. The pericardial effusion is circumferential. Mitral Valve: The mitral valve is normal in structure. No evidence of mitral valve regurgitation. No evidence of mitral valve stenosis. Tricuspid Valve: The tricuspid valve is normal in  structure. Tricuspid valve regurgitation is trivial. No evidence of tricuspid stenosis. Aortic Valve: The aortic valve is normal in structure. Aortic valve regurgitation is not visualized. No aortic stenosis is present. Pulmonic Valve: The pulmonic valve was normal in structure. Pulmonic valve regurgitation is trivial. No evidence of pulmonic stenosis. Aorta: The aortic root is normal in size and structure. Venous: The inferior vena cava is dilated in size with less than 50% respiratory variability, suggesting right atrial pressure of 15 mmHg. IAS/Shunts: No atrial level shunt detected by color flow Doppler.  LEFT VENTRICLE PLAX 2D LVIDd:         3.80 cm LVIDs:         2.90 cm LV PW:         0.80 cm LV IVS:        1.00 cm LVOT diam:     2.10 cm LVOT Area:     3.46 cm  RIGHT VENTRICLE             IVC RV S prime:     10.80 cm/s  IVC diam: 2.30 cm LEFT ATRIUM         Index LA diam:    2.20 cm 1.28 cm/m   AORTA Ao Root diam: 3.30 cm Ao Asc diam:  3.50 cm MITRAL VALVE MV Area (PHT): 9.98 cm    SHUNTS MV Decel Time: 76 msec     Systemic Diam: 2.10 cm MV E velocity: 76.60 cm/s MV A velocity: 60.70 cm/s MV E/A ratio:  1.26 Arvilla Meres MD Electronically signed by Arvilla Meres MD Signature Date/Time: 01/18/2023/6:05:49 PM    Final (Updated)    CT Angio Chest PE W and/or Wo Contrast Result Date: 01/18/2023 CLINICAL DATA:  Short of breath. Recent discharge from hospital. Productive cough. Concern for pulmonary embolism. * Tracking Code: BO * EXAM: CT ANGIOGRAPHY CHEST WITH CONTRAST TECHNIQUE: Multidetector CT imaging of the chest was performed using the standard protocol during bolus administration of intravenous contrast. Multiplanar CT image reconstructions and MIPs were obtained to evaluate the vascular anatomy. RADIATION DOSE REDUCTION: This exam was performed according to the departmental dose-optimization program which includes automated exposure control, adjustment of the mA and/or kV according to patient  size and/or use of iterative reconstruction technique. CONTRAST:  75mL OMNIPAQUE IOHEXOL 350 MG/ML SOLN COMPARISON:  CT 01/06/2023 FINDINGS: Cardiovascular: There is poor timing of the contrast bolus such that the aortic arch contrast is much denser than the pulmonary arteries. No proximal filling defect within the pulmonary arteries. The RIGHT pulmonary artery is narrowed by the mediastinal mass. The distal segmental pulmonary arteries are not well evaluated due to contrast bolus timing. The RIGHT upper lobe pulmonary arteries also markedly narrowed to the new near complete obstruction by the mediastinal mass. Mediastinum/Nodes: Bulky mediastinal nodal mass centered about the carina measuring 5.4 x 8.1 cm not changed from comparison exam. Metastatic lymph node anterior to the SVC is also unchanged on image 49. Lungs/Pleura: Increased interstitial thickening in the RIGHT lower lobe. Peripheral consolidation/mass in the lateral aspect of the RIGHT middle lobe measures 4.3 by 3.5 cm compared to 3.9 by 3.3 cm on prior remeasured. Visually the consolidated mass appears larger. Upper Abdomen: Limited view of the liver, kidneys, pancreas are unremarkable. Normal adrenal glands. Musculoskeletal: Several anterior medial LEFT rib fractures are new from comparison exam including fourth rib fracture on image 56/5 and fifth rib fracture on image 75/5. No pneumothorax. Review of the MIP images confirms the above findings. IMPRESSION: 1. New anterior medial LEFT rib fractures. No pneumothorax. 2. No proximal pulmonary embolism. Poor timing of the contrast bolus. 3. near complete obstruction of the RIGHT upper lobe pulmonary arteries by the mediastinal mass. 4. Increased size of peripheral consolidation/mass in the RIGHT middle lobe. Primary differential includes pneumonia versus neoplasm. Favor neoplasm Electronically Signed   By: Genevive Bi M.D.   On: 01/18/2023 17:32   DG CHEST PORT 1 VIEW Result Date:  01/18/2023 CLINICAL DATA:  ET tube. EXAM: PORTABLE CHEST 1 VIEW COMPARISON:  X-ray 01/18/2023 earlier FINDINGS: Interval placement of enteric tube with tip extending beneath the diaphragm. ET tube in place with tip seen proximally 5 cm above the carina. Overlapping cardiac leads and defibrillator pads. Hyperinflation with diffuse interstitial changes. Nodular opacity seen in the right lung base once again. Question tiny right effusion. No pneumothorax. Stable cardiopericardial silhouette. Widened mediastinum. IMPRESSION: New ET tube and enteric tube. Electronically Signed   By: Karen Kays M.D.   On: 01/18/2023 14:41   DG Chest Port 1 View Result Date: 01/18/2023 CLINICAL DATA:  Atrial fibrillation. EXAM: PORTABLE CHEST 1 VIEW COMPARISON:  Chest CT dated 01/06/2023. FINDINGS: Bibasilar reticular changes/scarring. A 3.7 cm focal opacity at the right lung base as well as faint area of subpleural density in the lateral  right mid lung field correspond to the consolidative changes and nodule seen on the CT. No new consolidation. There is no pleural effusion pneumothorax. Stable cardiac silhouette. No acute osseous pathology. IMPRESSION: 1. No acute cardiopulmonary process. 2. Right lung nodule and consolidative changes as seen on the CT. Electronically Signed   By: Elgie Collard M.D.   On: 01/18/2023 11:52   [place] CORE BIOPSY (LYMPH NODES) Result Date: 01/11/2023 INDICATION: No known primary, now with concern for metastatic small-cell lung cancer. Please perform ultrasound-guided liver or lymph node biopsy for tissue diagnostic purposes as indicated. EXAM: ULTRASOUND-GUIDED LEFT SUPRACLAVICULAR LYMPH NODE BIOPSY COMPARISON:  Chest CT-01/06/2023; CT abdomen pelvis-01/07/2023 MEDICATIONS: None ANESTHESIA/SEDATION: Moderate (conscious) sedation was employed during this procedure. A total of Versed 2 mg and Fentanyl 100 mcg was administered intravenously. Moderate Sedation Time: 10 minutes. The patient's level of  consciousness and vital signs were monitored continuously by radiology nursing throughout the procedure under my direct supervision. COMPLICATIONS: None immediate. TECHNIQUE: Informed written consent was obtained from the patient after a discussion of the risks, benefits and alternatives to treatment. Questions regarding the procedure were encouraged and answered. Initial ultrasound scanning demonstrated an at least 4.8 x 4.3 x 3.6 cm hypoechoic mass within the central aspect of the right lobe of the liver, correlating with the mass seen on preceding abdominal CT image 19, series 2. 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 An abnormal appearing left supraclavicular lymph node measuring at least 2.1 x 1.4 cm was identified within the left supraclavicular fossa (image 15), and thus was targeted for biopsy. Multiple ultrasound images were saved procedural documentation purposes. The procedure was planned. A timeout was performed prior to the initiation of the procedure. The operative was prepped and draped in the usual sterile fashion, and a sterile drape was applied covering the operative field. A timeout was performed prior to the initiation of the procedure. Local anesthesia was provided with 1% lidocaine with epinephrine. Under direct ultrasound guidance, an 18 gauge core needle device was utilized to obtain to obtain 6 core needle biopsies of the indeterminate left supraclavicular fossa. The samples were placed in saline and submitted to pathology. The needle was removed and superficial hemostasis was achieved with manual  compression. Post procedure scan was negative for significant hematoma. A dressing was applied. The patient tolerated the procedure well without immediate postprocedural complication. IMPRESSION: Technically successful ultrasound guided biopsy of dominant indeterminate left supraclavicular lymph node. 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 Electronically Signed   By: Simonne Come M.D.   On: 01/11/2023 12:10   MR BRAIN W WO CONTRAST Result Date: 01/09/2023 CLINICAL DATA:  Non-small cell lung carcinoma staging EXAM: MRI HEAD WITHOUT AND WITH CONTRAST TECHNIQUE: Multiplanar, multiecho pulse sequences of the brain and surrounding structures were obtained without and with intravenous contrast. CONTRAST:  6mL GADAVIST GADOBUTROL 1 MMOL/ML IV SOLN COMPARISON:  06/03/2003 FINDINGS: Brain: No acute infarct, mass effect or extra-axial  collection. Chronic microhemorrhage in the left pons. There is multifocal hyperintense T2-weighted signal within the white matter. Generalized volume loss. The midline structures are normal. There is no abnormal contrast enhancement. Vascular: Normal flow voids. Skull and upper cervical spine: Bilateral mastoid fluid. Paranasal sinuses are clear. Normal orbits. Sinuses/Orbits:No paranasal sinus fluid levels or advanced mucosal thickening. No mastoid or middle ear effusion. Normal orbits. IMPRESSION: 1. No intracranial metastatic disease. 2. Findings of chronic small vessel ischemia and volume loss. Electronically Signed   By: Deatra Robinson M.D.   On: 01/09/2023 01:49   US Abdomen Limited RUQ (LIVER/GB) Result Date: 01/08/2023 CLINICAL DATA:  Central right lobe liver lesion and right lung mass  suspicious for malignancy. The patient presents for ultrasound-guided biopsy of the liver lesion. EXAM: ULTRASOUND ABDOMEN LIMITED RIGHT UPPER QUADRANT COMPARISON:  None Available. FINDINGS: Ultrasound was performed in anticipation of performing image guided liver lesion biopsy. A discrete ovoid hypoechoic mass in the central right lobe measures approximately 4.7 x 3.3 x 4.1 cm and is in the deep central right lobe. The biopsy procedure could not be performed as multiple nurses were unsuccessful in establishing durable IV access for the patient to receive IV conscious sedation. The patient had to be sent back to his room for the IV Team to try to establish IV access. The liver biopsy will be rescheduled. IMPRESSION: 4.7 cm central right lobe liver mass. A biopsy procedure could not be performed as multiple nurses were unsuccessful in establishing IV access for the patient to receive IV conscious sedation. The liver biopsy will be rescheduled. Electronically Signed   By: Irish Lack M.D.   On: 01/08/2023 16:59   CT ABDOMEN PELVIS W CONTRAST Result Date: 01/07/2023 CLINICAL DATA:  Abnormal CT of the chest 1 day prior. Lymphadenopathy and pulmonary mass. * Tracking Code: BO * EXAM: CT ABDOMEN AND PELVIS WITH CONTRAST TECHNIQUE: Multidetector CT imaging of the abdomen and pelvis was performed using the standard protocol following bolus administration of intravenous contrast. RADIATION DOSE REDUCTION: This exam was performed according to the departmental dose-optimization program which includes automated exposure control, adjustment of the mA and/or kV according to patient size and/or use of iterative reconstruction technique. CONTRAST:  OMNIPAQUE IOHEXOL 300 MG/ML  SOLN COMPARISON:  CT chest 01/06/2023 FINDINGS: Lower chest: Rounded 3.6 cm mass in the RIGHT middle lobe (image 8). Mediastinal hilar adenopathy not well appreciated at the base the heart. See CT chest comparison Hepatobiliary: Low-density  lesion centrally within liver is hypoenhancing measuring 31 x 26 mm (image 17/2) Pancreas: No biliary duct dilatation. No pancreatic duct dilatation. Body and tail the pancreas are mildly atrophic. Mass lesion identified. Spleen: Normal spleen Adrenals/urinary tract: Adrenal glands and kidneys are normal. The ureters and bladder normal. Stomach/Bowel: Stomach and small bowel normal. There is moderate volume stool throughout the colon without obstructing lesion identified. Vascular/Lymphatic: Dense calcification abdominal aorta. There small periaortic retroperitoneal lymph nodes measuring up to 9 mm (image 39/series 2) no pelvic lymphadenopathy. No inguinal adenopathy Reproductive: Unremarkable Other: No free fluid. Musculoskeletal: No aggressive osseous lesion. IMPRESSION: 1. Hypoenhancing lesion centrally within liver is most consistent with hepatic metastasis. 2. Small periaortic retroperitoneal lymph nodes are concerning for metastasis 3. RIGHT middle lobe mass is concerning for primary lung neoplasm. See CT chest comparison. 4. No evidence of pancreatic mass. 5. There is moderate volume stool throughout the colon without obstructing lesion identified. Cannot exclude colorectal carcinoma primary but favor lung cancer. 6.  Aortic Atherosclerosis (ICD10-I70.0). Electronically Signed  By: Genevive Bi M.D.   On: 01/07/2023 16:15    Microbiology No results found for this or any previous visit (from the past 240 hours).  Lab Basic Metabolic Panel: No results for input(s): "NA", "K", "CL", "CO2", "GLUCOSE", "BUN", "CREATININE", "CALCIUM", "MG", "PHOS" in the last 168 hours. Liver Function Tests: No results for input(s): "AST", "ALT", "ALKPHOS", "BILITOT", "PROT", "ALBUMIN" in the last 168 hours. No results for input(s): "LIPASE", "AMYLASE" in the last 168 hours. No results for input(s): "AMMONIA" in the last 168 hours. CBC: No results for input(s): "WBC", "NEUTROABS", "HGB", "HCT", "MCV", "PLT" in  the last 168 hours. Cardiac Enzymes: No results for input(s): "CKTOTAL", "CKMB", "CKMBINDEX", "TROPONINI" in the last 168 hours. Sepsis Labs: No results for input(s): "PROCALCITON", "WBC", "LATICACIDVEN" in the last 168 hours.  Procedures/Operations  Cardiopulmonary Resuscitation Endotracheal intubation Arterial Line    Martina Sinner 02/05/2023, 5:37 PM

## 2023-02-09 NOTE — Progress Notes (Signed)
Fentanyl and Versed wasted in Pyxis with Press photographer.

## 2023-02-09 DEATH — deceased

## 2023-07-11 IMAGING — CT CT ABD-PELV W/ CM
2 of 5 series · 16 of 46 positions shown, 18 images · IV contrast (OMNIPAQUE 300)
Comparison: 06/26/2020

CLINICAL DATA: Abdominal pain, acute, nonlocalized. History of
pancreatitis.

EXAM:
CT ABDOMEN AND PELVIS WITH CONTRAST
TECHNIQUE: Multidetector CT imaging of the abdomen and pelvis was performed
using the standard protocol following bolus administration of
intravenous contrast.

[Series 2: axial st · axial · 0.71mm/px · z∈[-485,-95]mm · 13 of 90 slices shown, 15 images]
[im 6/90  soft-tissue]
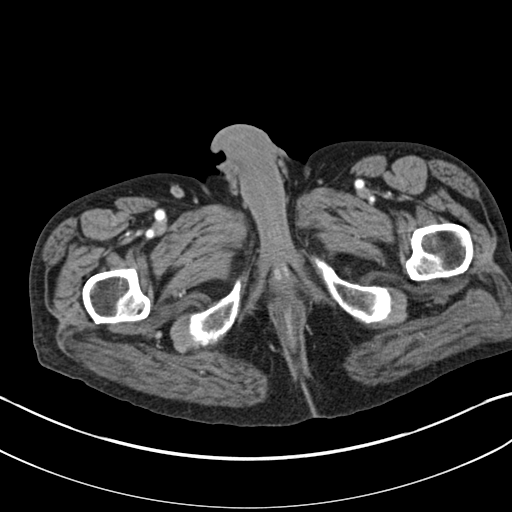
[im 6/90  bone]
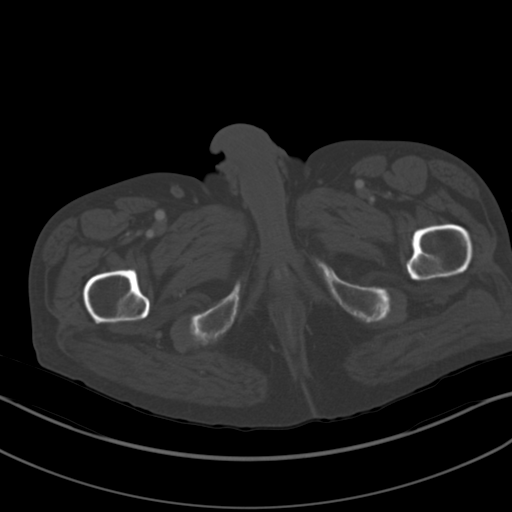
[im 12/90  soft-tissue]
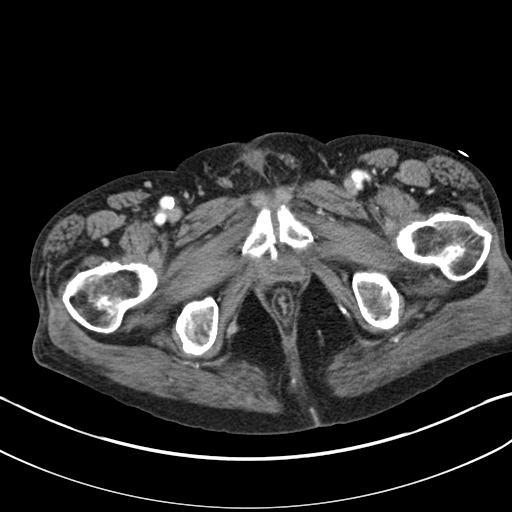
[im 18/90  soft-tissue]
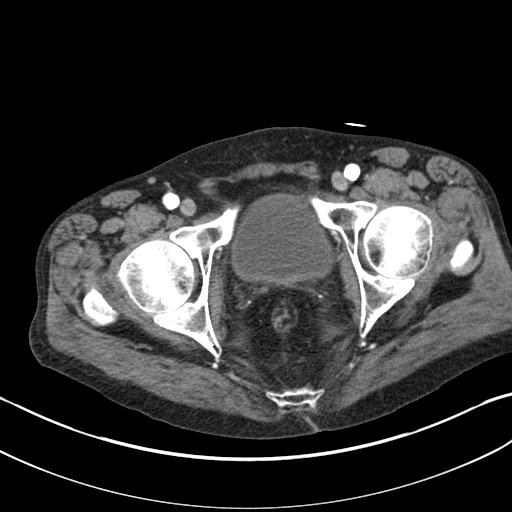
[im 24/90  soft-tissue]
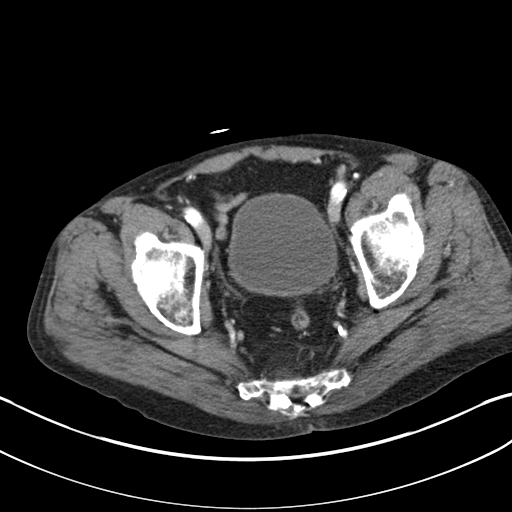
[im 30/90  soft-tissue]
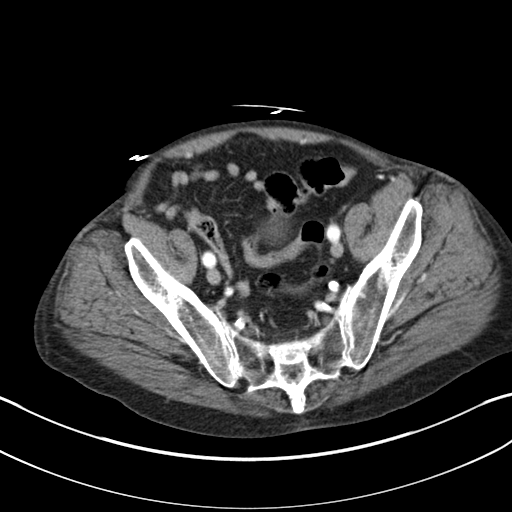
[im 36/90  soft-tissue]
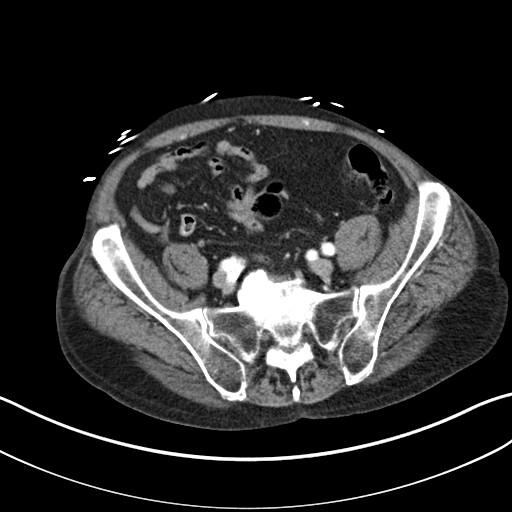
[im 48/90  soft-tissue]
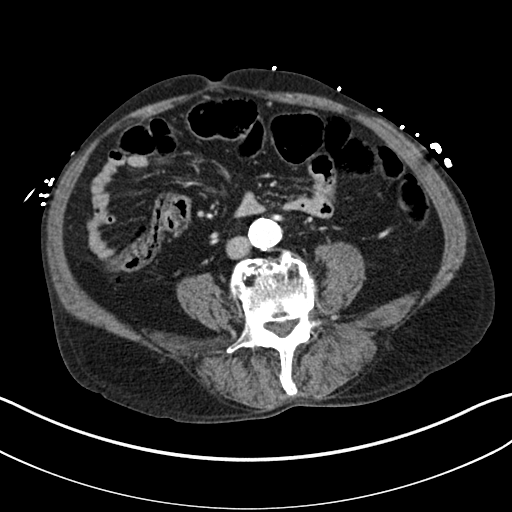
[im 54/90  soft-tissue]
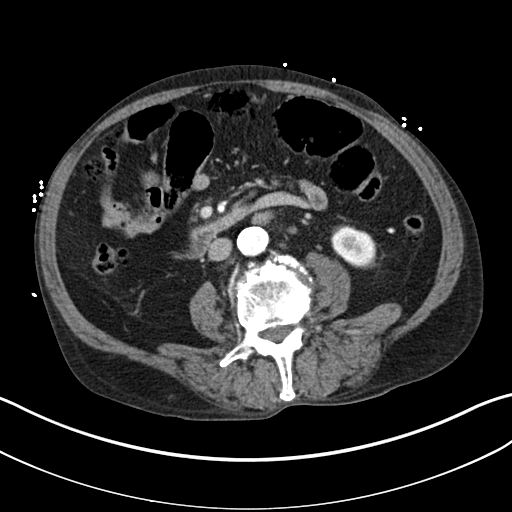
[im 60/90  soft-tissue]
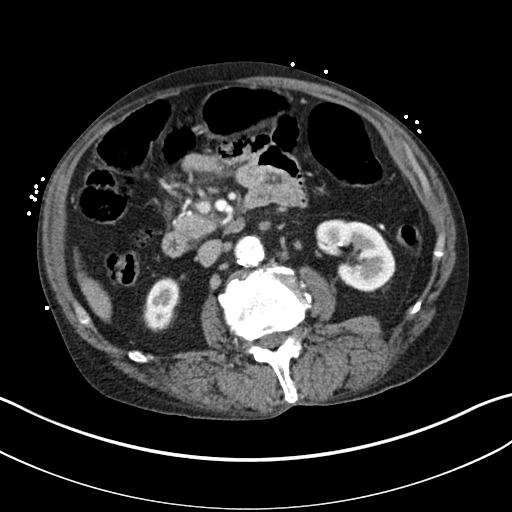
[im 60/90  bone]
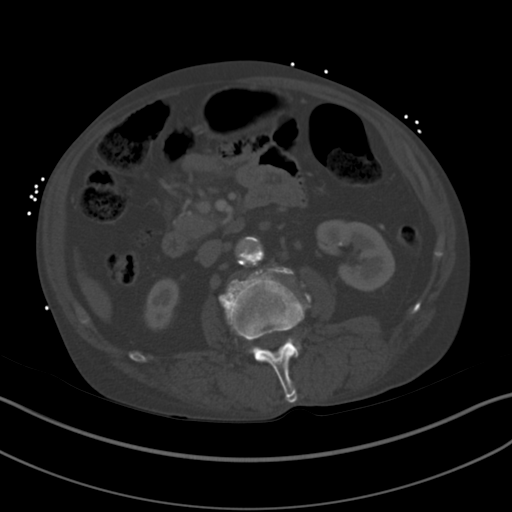
[im 66/90  soft-tissue]
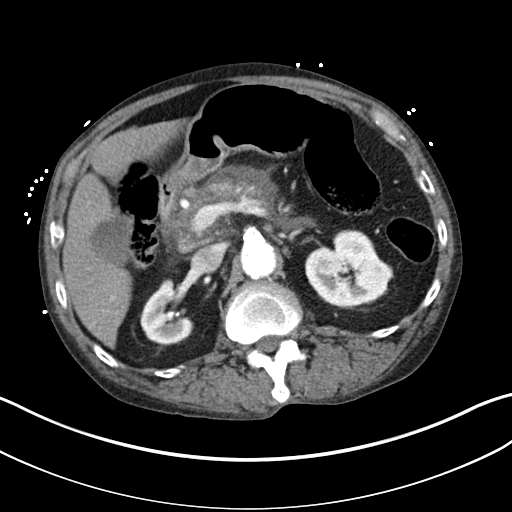
[im 72/90  soft-tissue]
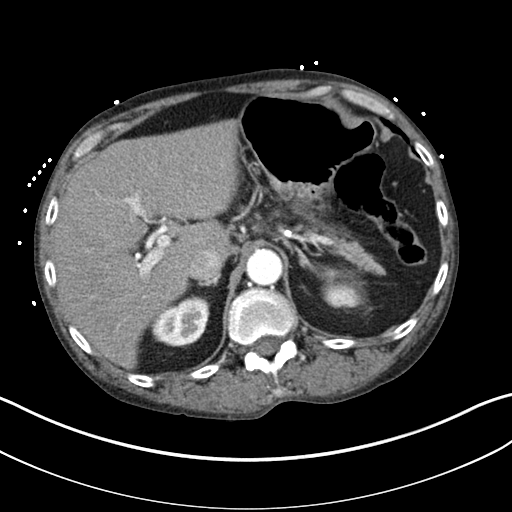
[im 78/90  soft-tissue]
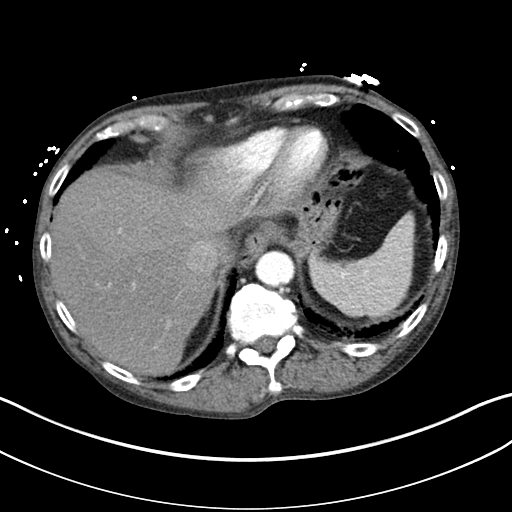
[im 84/90  soft-tissue]
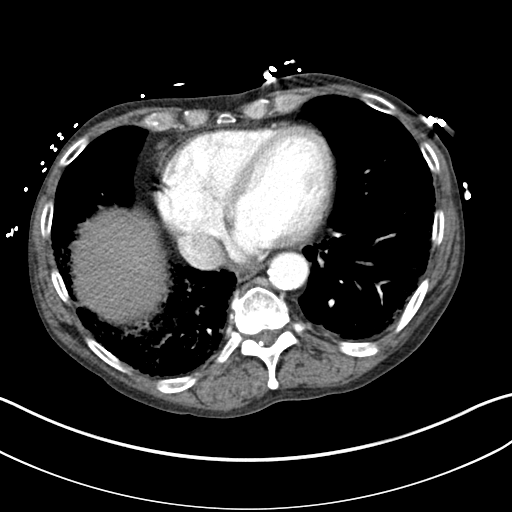

[Series 4: coronal st · coronal · 0.81mm/px · 3 of 151 slices shown]
[im 51/151  soft-tissue]
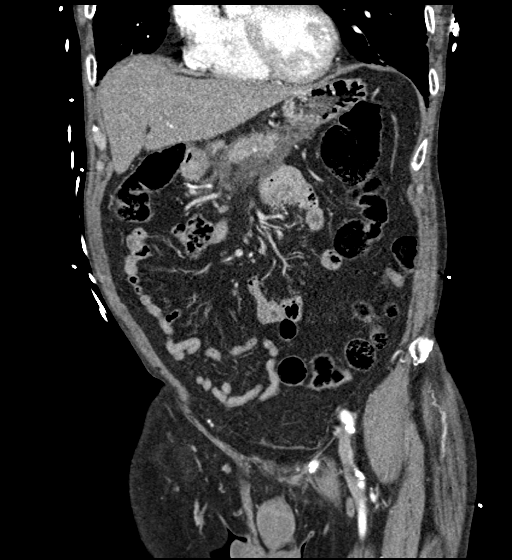
[im 67/151  soft-tissue]
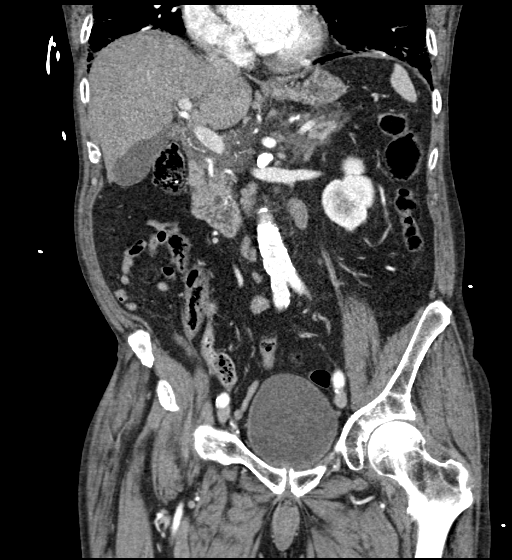
[im 84/151  soft-tissue]
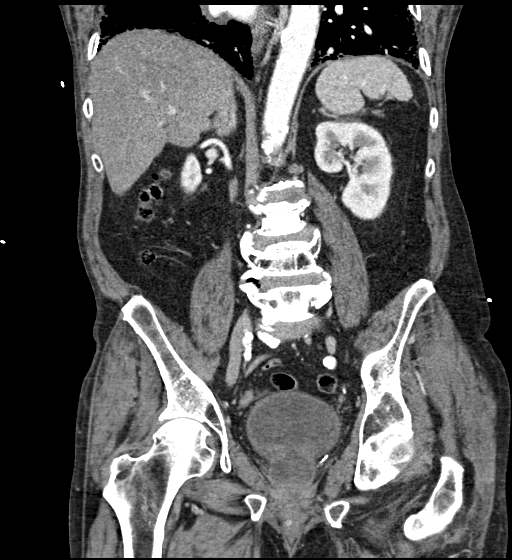

[16 of 46 positions shown; findings below may reference images not displayed]

RADIATION DOSE REDUCTION: This exam was performed according to the
departmental dose-optimization program which includes automated
exposure control, adjustment of the mA and/or kV according to
patient size and/or use of iterative reconstruction technique.

CONTRAST:  100mL OMNIPAQUE IOHEXOL 300 MG/ML  SOLN
FINDINGS: Lower chest: Chronic fibrotic and emphysematous change at the lung
bases, right worse than left. No evidence of acute consolidation or
pleural effusion.

Hepatobiliary: Liver parenchyma is normal.  No calcified gallstones.

Pancreas: Acute inflammatory change of the body and head of the
pancreas with surrounding edema. No evidence of pseudocyst or
abscess.

Spleen: Normal

Adrenals/Urinary Tract: Adrenal glands are normal. Mild atrophic
change of the right kidney. Several small calcifications which could
be vascular or small nonobstructing stones. The left kidney also
shows scattered calcifications that could be vascular or small
stones. No hydronephrosis on either side. The bladder is normal.

Stomach/Bowel: Stomach and small intestine are normal. Normal
appendix. No colon pathology.

Vascular/Lymphatic: Aortic atherosclerosis, pronounced. No aneurysm.
IVC is normal. No adenopathy.

Reproductive: Normal

Other: No free fluid or air.

Musculoskeletal: Chronic lumbar degenerative changes.
IMPRESSION: Acute pancreatitis of the head and proximal body. No pseudocyst or
abscess.

Chronic emphysematous and fibrotic changes at the lung bases.

Aortic atherosclerosis, advanced.

Small nonobstructing renal calculi and/or vascular calcifications.
